# Patient Record
Sex: Male | Born: 1955 | Race: White | Hispanic: No | Marital: Single | Smoking: Current every day smoker
Health system: Southern US, Community
[De-identification: ages and names within clinical notes are randomized; demographics above are authoritative.]

## PROBLEM LIST (undated history)

## (undated) DIAGNOSIS — M109 Gout, unspecified: Secondary | ICD-10-CM

## (undated) DIAGNOSIS — C801 Malignant (primary) neoplasm, unspecified: Secondary | ICD-10-CM

## (undated) DIAGNOSIS — N200 Calculus of kidney: Secondary | ICD-10-CM

## (undated) DIAGNOSIS — H269 Unspecified cataract: Secondary | ICD-10-CM

## (undated) DIAGNOSIS — B009 Herpesviral infection, unspecified: Secondary | ICD-10-CM

## (undated) DIAGNOSIS — M1990 Unspecified osteoarthritis, unspecified site: Secondary | ICD-10-CM

## (undated) DIAGNOSIS — G4730 Sleep apnea, unspecified: Secondary | ICD-10-CM

## (undated) DIAGNOSIS — I10 Essential (primary) hypertension: Secondary | ICD-10-CM

## (undated) DIAGNOSIS — M810 Age-related osteoporosis without current pathological fracture: Secondary | ICD-10-CM

## (undated) DIAGNOSIS — Z820 Family history of epilepsy and other diseases of the nervous system: Secondary | ICD-10-CM

## (undated) DIAGNOSIS — J439 Emphysema, unspecified: Secondary | ICD-10-CM

## (undated) DIAGNOSIS — E785 Hyperlipidemia, unspecified: Secondary | ICD-10-CM

## (undated) DIAGNOSIS — J449 Chronic obstructive pulmonary disease, unspecified: Secondary | ICD-10-CM

## (undated) HISTORY — PX: BLADDER SURGERY: SHX569

## (undated) HISTORY — DX: Unspecified cataract: H26.9

## (undated) HISTORY — DX: Age-related osteoporosis without current pathological fracture: M81.0

## (undated) HISTORY — DX: Essential (primary) hypertension: I10

## (undated) HISTORY — DX: Malignant (primary) neoplasm, unspecified: C80.1

## (undated) HISTORY — DX: Sleep apnea, unspecified: G47.30

## (undated) HISTORY — DX: Hyperlipidemia, unspecified: E78.5

## (undated) HISTORY — DX: Herpesviral infection, unspecified: B00.9

## (undated) HISTORY — PX: SURGERY SCROTAL / TESTICULAR: SUR1316

## (undated) HISTORY — DX: Calculus of kidney: N20.0

## (undated) HISTORY — PX: OTHER SURGICAL HISTORY: SHX169

## (undated) HISTORY — DX: Family history of epilepsy and other diseases of the nervous system: Z82.0

## (undated) HISTORY — DX: Unspecified osteoarthritis, unspecified site: M19.90

## (undated) HISTORY — PX: FOOT SURGERY: SHX648

## (undated) HISTORY — DX: Emphysema, unspecified: J43.9

---

## 2012-09-23 ENCOUNTER — Emergency Department (HOSPITAL_COMMUNITY)
Admission: EM | Admit: 2012-09-23 | Discharge: 2012-09-24 | Disposition: A | Discharge status: Home / Self Care | Payer: Self-pay | Attending: Emergency Medicine | Admitting: Emergency Medicine

## 2012-09-23 ENCOUNTER — Encounter (HOSPITAL_COMMUNITY): Payer: Self-pay | Admitting: *Deleted

## 2012-09-23 DIAGNOSIS — F17200 Nicotine dependence, unspecified, uncomplicated: Secondary | ICD-10-CM | POA: Insufficient documentation

## 2012-09-23 DIAGNOSIS — J449 Chronic obstructive pulmonary disease, unspecified: Secondary | ICD-10-CM | POA: Insufficient documentation

## 2012-09-23 DIAGNOSIS — Z79899 Other long term (current) drug therapy: Secondary | ICD-10-CM | POA: Insufficient documentation

## 2012-09-23 DIAGNOSIS — J4489 Other specified chronic obstructive pulmonary disease: Secondary | ICD-10-CM | POA: Insufficient documentation

## 2012-09-23 DIAGNOSIS — M109 Gout, unspecified: Secondary | ICD-10-CM | POA: Insufficient documentation

## 2012-09-23 HISTORY — DX: Gout, unspecified: M10.9

## 2012-09-23 HISTORY — DX: Chronic obstructive pulmonary disease, unspecified: J44.9

## 2012-09-23 NOTE — ED Notes (Signed)
Pt reporting flare up of gout.  Reports he has taken medications prescribed from [place], but it didn't prevent flare.  Reporting pain began 2 days ago.

## 2012-09-23 NOTE — ED Provider Notes (Signed)
History     CSN: [id number]  Arrival date & time 09/23/12  2325   First MD Initiated Contact with Patient 09/23/12 2337      Chief Complaint  Patient presents with  . Gout    (Consider location/radiation/quality/duration/timing/severity/associated sxs/prior treatment) Patient is a 56 y.o. male presenting with lower extremity pain. The history is provided by the patient.  Foot Pain This is a new problem. The current episode started in the past 7 days. The problem occurs constantly. The problem has been gradually worsening. Associated symptoms include arthralgias. Pertinent negatives include no abdominal pain, chest pain, coughing or neck pain. The symptoms are aggravated by standing and walking. He has tried nothing for the symptoms. The treatment provided no relief.    No past medical history on file.  No past surgical history on file.  No family history on file.  History  Substance Use Topics  . Smoking status: Not on file  . Smokeless tobacco: Not on file  . Alcohol Use: Not on file      Review of Systems  Constitutional: Negative for activity change.       All ROS Neg except as noted in HPI  HENT: Negative for nosebleeds and neck pain.   Eyes: Negative for photophobia and discharge.  Respiratory: Negative for cough, shortness of breath and wheezing.   Cardiovascular: Negative for chest pain and palpitations.  Gastrointestinal: Negative for abdominal pain and blood in stool.  Genitourinary: Negative for dysuria, frequency and hematuria.  Musculoskeletal: Positive for arthralgias. Negative for back pain.  Skin: Negative.   Neurological: Negative for dizziness, seizures and speech difficulty.  Psychiatric/Behavioral: Negative for hallucinations and confusion.    Allergies  Review of patient's allergies indicates not on file.  Home Medications  No current outpatient prescriptions on file.  There were no vitals taken for this visit.  Physical Exam  Nursing  note and vitals reviewed. Constitutional: He is oriented to person, place, and time. He appears well-developed and well-nourished.  Non-toxic appearance.  HENT:  Head: Normocephalic.  Right Ear: Tympanic membrane and external ear normal.  Left Ear: Tympanic membrane and external ear normal.  Eyes: EOM and lids are normal. Pupils are equal, round, and reactive to light.  Neck: Normal range of motion. Neck supple. Carotid bruit is not present.  Cardiovascular: Normal rate, regular rhythm, normal heart sounds, intact distal pulses and normal pulses.   Pulmonary/Chest: Breath sounds normal. No respiratory distress.  Abdominal: Soft. Bowel sounds are normal. There is no tenderness. There is no guarding.  Musculoskeletal: Normal range of motion.       There is pain to palpation of the toes and the dorsum of the right foot. No lesions between the toes. No punctures to the plantar surface. Dorsalis pedis pulse and posterior tibial pulses are 2+ symmetrical. There is increased redness of the right first toe with some increased warmth present.  Lymphadenopathy:       Head (right side): No submandibular adenopathy present.       Head (left side): No submandibular adenopathy present.    He has no cervical adenopathy.  Neurological: He is alert and oriented to person, place, and time. He has normal strength. No cranial nerve deficit or sensory deficit.  Skin: Skin is warm and dry.  Psychiatric: He has a normal mood and affect. His speech is normal.    ED Course  Procedures (including critical care time)  Labs Reviewed - No data to display No results found.  No diagnosis found.    MDM  I have reviewed nursing notes, vital signs, and all appropriate lab and imaging results for this patient. Patient has history of gout related arthritis he has been treated by the [facility] with allopurinol.  the patient statedthat he began to notice the pain increasing approximately 3-4  days ago. He stopped the allopurinol because the pains was getting worse, and after being unable to stand for any length of time without severe pain today while at work he now presents to the emergency department. Vital signs are within normal limits with exception of the blood pressure being elevated at 164/98. No neurovascular deficits appreciated. Prescription for Mobic, Decadron, and Norco given to the patient. Patient is to follow up with the physicians at the [facility] as sone as possible.       Kathie Dike, [place] 09/24/12 [phone]

## 2012-09-24 MED ORDER — ONDANSETRON HCL 4 MG PO TABS
4.0000 mg | ORAL_TABLET | Freq: Once | ORAL | Status: AC
  Administered 2012-09-24: 4 mg via ORAL
  Filled 2012-09-24: qty 1

## 2012-09-24 MED ORDER — DEXAMETHASONE 6 MG PO TABS: ORAL_TABLET | ORAL | Status: DC

## 2012-09-24 MED ORDER — MORPHINE SULFATE 4 MG/ML IJ SOLN
8.0000 mg | Freq: Once | INTRAMUSCULAR | Status: AC
  Administered 2012-09-24: 8 mg via INTRAMUSCULAR
  Filled 2012-09-24: qty 2

## 2012-09-24 MED ORDER — MELOXICAM 7.5 MG PO TABS: 7.5000 mg | ORAL_TABLET | Freq: Every day | ORAL | Status: DC

## 2012-09-24 MED ORDER — DEXAMETHASONE SODIUM PHOSPHATE 4 MG/ML IJ SOLN
8.0000 mg | Freq: Once | INTRAMUSCULAR | Status: AC
  Administered 2012-09-24: 8 mg via INTRAMUSCULAR
  Filled 2012-09-24: qty 2

## 2012-09-24 MED ORDER — HYDROCODONE-ACETAMINOPHEN 5-325 MG PO TABS: ORAL_TABLET | ORAL | Status: DC

## 2012-09-24 MED ORDER — KETOROLAC TROMETHAMINE 10 MG PO TABS
10.0000 mg | ORAL_TABLET | Freq: Once | ORAL | Status: AC
  Administered 2012-09-24: 10 mg via ORAL
  Filled 2012-09-24: qty 1

## 2012-09-24 NOTE — ED Provider Notes (Signed)
Medical screening examination/treatment/procedure(s) were performed by non-physician practitioner and as supervising physician I was immediately available for consultation/collaboration.  Nicoletta Dress. Colon Branch, MD 09/24/12 1610

## 2018-10-09 ENCOUNTER — Encounter: Payer: Self-pay | Admitting: Gastroenterology

## 2018-10-28 ENCOUNTER — Other Ambulatory Visit: Payer: Self-pay

## 2018-10-28 ENCOUNTER — Ambulatory Visit (AMBULATORY_SURGERY_CENTER): Payer: Self-pay | Admitting: *Deleted

## 2018-10-28 VITALS — Ht 67.5 in | Wt 158.2 lb

## 2018-10-28 DIAGNOSIS — Z1211 Encounter for screening for malignant neoplasm of colon: Secondary | ICD-10-CM

## 2018-10-28 MED ORDER — PEG-KCL-NACL-NASULF-NA ASC-C 100 G PO SOLR: 1.0000 | Freq: Once | ORAL | 0 refills | Status: AC

## 2018-10-28 NOTE — Progress Notes (Signed)
Patient denies any allergies to egg or soy products. Patient denies complications with anesthesia/sedation.  Patient denies oxygen use at home and denies diet medications. Patient denies information on colonoscopy.  Patient does not use CPAP machine nightly.

## 2018-11-11 ENCOUNTER — Ambulatory Visit (AMBULATORY_SURGERY_CENTER): Payer: No Typology Code available for payment source | Admitting: Gastroenterology

## 2018-11-11 ENCOUNTER — Encounter: Payer: Self-pay | Admitting: Gastroenterology

## 2018-11-11 VITALS — BP 143/88 | HR 52 | Temp 97.8°F | Resp 15 | Ht 67.5 in | Wt 158.0 lb

## 2018-11-11 DIAGNOSIS — D128 Benign neoplasm of rectum: Secondary | ICD-10-CM | POA: Diagnosis not present

## 2018-11-11 DIAGNOSIS — D124 Benign neoplasm of descending colon: Secondary | ICD-10-CM | POA: Diagnosis not present

## 2018-11-11 DIAGNOSIS — D122 Benign neoplasm of ascending colon: Secondary | ICD-10-CM

## 2018-11-11 DIAGNOSIS — Z1211 Encounter for screening for malignant neoplasm of colon: Secondary | ICD-10-CM | POA: Diagnosis not present

## 2018-11-11 MED ORDER — SODIUM CHLORIDE 0.9 % IV SOLN: 500.0000 mL | Freq: Once | INTRAVENOUS | Status: DC

## 2018-11-11 NOTE — Op Note (Signed)
[place] Patient Name: Glen Cobb Procedure Date: 11/11/2018 10:51 AM MRN: [id number] Endoscopist: Thornton Park MD, MD Age: 63 Referring MD:  Date of Birth: April 15, 1956 Gender: Male Account #: [id number] Procedure:                Colonoscopy Indications:              Screening for colorectal malignant neoplasm, This                            is the patient's first colonoscopy. No known family                            history of colon cancer or polyps. Chronic lower                            abdominal/groin pain following treatment for                            bladder cancer. Medicines:                See the Anesthesia note for documentation of the                            administered medications Procedure:                Pre-Anesthesia Assessment:                           - Prior to the procedure, a History and Physical                            was performed, and patient medications and                            allergies were reviewed. The patient's tolerance of                            previous anesthesia was also reviewed. The risks                            and benefits of the procedure and the sedation                            options and risks were discussed with the patient.                            All questions were answered, and informed consent                            was obtained. Prior Anticoagulants: The patient has                            taken no previous anticoagulant or antiplatelet  agents. ASA Grade Assessment: II - A patient with                            mild systemic disease. After reviewing the risks                            and benefits, the patient was deemed in                            satisfactory condition to undergo the procedure.                           After obtaining informed consent, the colonoscope                            was passed under direct vision. Throughout the                             procedure, the patient's blood pressure, pulse, and                            oxygen saturations were monitored continuously. The                            Colonoscope was introduced through the anus and                            advanced to the the terminal ileum, with                            identification of the appendiceal orifice and IC                            valve. The colonoscopy was performed without                            difficulty. The patient tolerated the procedure                            well. The quality of the bowel preparation was                            good. The terminal ileum, ileocecal valve,                            appendiceal orifice, and rectum were photographed. Scope In: 11:00:40 AM Scope Out: 11:15:53 AM Scope Withdrawal Time: 0 hours 12 minutes 45 seconds  Total Procedure Duration: 0 hours 15 minutes 13 seconds  Findings:                 The perianal and digital rectal examinations were                            normal.  Two sessile polyps were found in the ascending                            colon. The polyps were 3 to 8 mm in size. These                            polyps were removed with a cold snare. Resection                            and retrieval were complete. Estimated blood loss                            was minimal.                           Two sessile polyps were found in the descending                            colon. The polyps were 4 to 6 mm in size. These                            polyps were removed with a cold snare. Resection                            and retrieval were complete. Estimated blood loss                            was minimal.                           Two sessile polyps were found in the rectum. The                            polyps were 2 to 3 mm in size. These polyps were                            removed with a cold snare. Resection and  retrieval                            were complete.                           The exam was otherwise without abnormality on                            direct and retroflexion views. Complications:            No immediate complications. Estimated blood loss:                            Minimal. Estimated Blood Loss:     Estimated blood loss was minimal. Impression:               - Two 3 to 8 mm polyps in the ascending colon,  removed with a cold snare. Resected and retrieved.                           - Two 4 to 6 mm polyps in the descending colon,                            removed with a cold snare. Resected and retrieved.                           - Two 2 to 3 mm polyps in the rectum, removed with                            a cold snare. Resected and retrieved.                           - The examination was otherwise normal on direct                            and retroflexion views. Recommendation:           - Patient has a contact number available for                            emergencies. The signs and symptoms of potential                            delayed complications were discussed with the                            patient. Return to normal activities tomorrow.                            Written discharge instructions were provided to the                            patient.                           - Resume regular diet.                           - Continue present medications.                           - Await pathology results.                           - Repeat colonoscopy in 3 years for surveillance if                            at least 5 polyps are adenomatous. Repeat                            colonoscopy in 5 years if at least 3 polyps are  adenomatous. Thornton Park MD, MD 11/11/2018 11:35:08 AM This report has been signed electronically.

## 2018-11-11 NOTE — Progress Notes (Signed)
Patient had a sleep study last week but doesn't know the results yet. Does have hx sleep apnea.

## 2018-11-11 NOTE — Progress Notes (Signed)
PT taken to PACU. Monitors in place. VSS. Report given to RN. 

## 2018-11-11 NOTE — Patient Instructions (Signed)
     Thank you for allowing [place] to care for you today!  Await pathology results by mail, approximately 2 weeks.  Will make recommendation at that time for date of next colonoscopy.  Resume previous diet and medications today.  Return to normal activities tomorrow.     YOU HAD AN ENDOSCOPIC PROCEDURE TODAY AT [facility]:   Refer to the procedure report that was given to you for any specific questions about what was found during the examination.  If the procedure report does not answer your questions, please call your gastroenterologist to clarify.  If you requested that your care partner not be given the details of your procedure findings, then the procedure report has been included in a sealed envelope for you to review at your convenience later.  YOU SHOULD EXPECT: Some feelings of bloating in the abdomen. Passage of more gas than usual.  Walking can help get rid of the air that was put into your GI tract during the procedure and reduce the bloating. If you had a lower endoscopy (such as a colonoscopy or flexible sigmoidoscopy) you may notice spotting of blood in your stool or on the toilet paper. If you underwent a bowel prep for your procedure, you may not have a normal bowel movement for a few days.  Please Note:  You might notice some irritation and congestion in your nose or some drainage.  This is from the oxygen used during your procedure.  There is no need for concern and it should clear up in a day or so.  SYMPTOMS TO REPORT IMMEDIATELY:   Following lower endoscopy (colonoscopy or flexible sigmoidoscopy):  Excessive amounts of blood in the stool  Significant tenderness or worsening of abdominal pains  Swelling of the abdomen that is new, acute  Fever of 100F or higher   For urgent or emergent issues, a gastroenterologist can be reached at any hour by calling [phone].   DIET:  We do recommend a small meal at first, but then you may proceed to your  regular diet.  Drink plenty of fluids but you should avoid alcoholic beverages for 24 hours.  ACTIVITY:  You should plan to take it easy for the rest of today and you should NOT DRIVE or use heavy machinery until tomorrow (because of the sedation medicines used during the test).    FOLLOW UP: Our staff will call the number listed on your records the next business day following your procedure to check on you and address any questions or concerns that you may have regarding the information given to you following your procedure. If we do not reach you, we will leave a message.  However, if you are feeling well and you are not experiencing any problems, there is no need to return our call.  We will assume that you have returned to your regular daily activities without incident.  If any biopsies were taken you will be contacted by phone or by letter within the next 1-3 weeks.  Please call us at [phone] if you have not heard about the biopsies in 3 weeks.    SIGNATURES/CONFIDENTIALITY: You and/or your care partner have signed paperwork which will be entered into your electronic medical record.  These signatures attest to the fact that that the information above on your After Visit Summary has been reviewed and is understood.  Full responsibility of the confidentiality of this discharge information lies with you and/or your care-partner.

## 2018-11-11 NOTE — Progress Notes (Signed)
Called to room to assist during endoscopic procedure.  Patient ID and intended procedure confirmed with present staff. Received instructions for my participation in the procedure from the performing physician.  

## 2018-11-12 ENCOUNTER — Telehealth: Payer: Self-pay | Admitting: *Deleted

## 2018-11-12 NOTE — Telephone Encounter (Signed)
  Follow up Call-  Call back number 11/11/2018  Post procedure Call Back phone  # [phone]  Permission to leave phone message Yes  Some recent data might be hidden     Patient questions:  Do you have a fever, pain , or abdominal swelling? No. Pain Score  0 *  Have you tolerated food without any problems? Yes.    Have you been able to return to your normal activities? Yes.    Do you have any questions about your discharge instructions: Diet   No. Medications  No. Follow up visit  No.  Do you have questions or concerns about your Care? Yes.    Actions: * If pain score is 4 or above: No action needed, pain <4.

## 2018-11-13 ENCOUNTER — Encounter: Payer: Self-pay | Admitting: Gastroenterology

## 2019-08-18 ENCOUNTER — Encounter (HOSPITAL_COMMUNITY): Payer: Self-pay | Admitting: *Deleted

## 2019-08-18 ENCOUNTER — Inpatient Hospital Stay (HOSPITAL_COMMUNITY)
Admission: EM | Admit: 2019-08-18 | Discharge: 2019-08-22 | DRG: 201 | Disposition: A | Discharge status: Home / Self Care | Payer: No Typology Code available for payment source | Attending: Internal Medicine | Admitting: Internal Medicine

## 2019-08-18 ENCOUNTER — Other Ambulatory Visit: Payer: Self-pay

## 2019-08-18 ENCOUNTER — Emergency Department (HOSPITAL_COMMUNITY): Payer: No Typology Code available for payment source

## 2019-08-18 DIAGNOSIS — Z885 Allergy status to narcotic agent status: Secondary | ICD-10-CM

## 2019-08-18 DIAGNOSIS — I10 Essential (primary) hypertension: Secondary | ICD-10-CM | POA: Diagnosis present

## 2019-08-18 DIAGNOSIS — F17210 Nicotine dependence, cigarettes, uncomplicated: Secondary | ICD-10-CM | POA: Diagnosis present

## 2019-08-18 DIAGNOSIS — Z7951 Long term (current) use of inhaled steroids: Secondary | ICD-10-CM | POA: Diagnosis not present

## 2019-08-18 DIAGNOSIS — M810 Age-related osteoporosis without current pathological fracture: Secondary | ICD-10-CM | POA: Diagnosis present

## 2019-08-18 DIAGNOSIS — J441 Chronic obstructive pulmonary disease with (acute) exacerbation: Secondary | ICD-10-CM | POA: Diagnosis not present

## 2019-08-18 DIAGNOSIS — Z8551 Personal history of malignant neoplasm of bladder: Secondary | ICD-10-CM | POA: Diagnosis not present

## 2019-08-18 DIAGNOSIS — R0602 Shortness of breath: Secondary | ICD-10-CM

## 2019-08-18 DIAGNOSIS — J439 Emphysema, unspecified: Secondary | ICD-10-CM | POA: Diagnosis present

## 2019-08-18 DIAGNOSIS — E785 Hyperlipidemia, unspecified: Secondary | ICD-10-CM | POA: Diagnosis present

## 2019-08-18 DIAGNOSIS — M109 Gout, unspecified: Secondary | ICD-10-CM | POA: Diagnosis present

## 2019-08-18 DIAGNOSIS — Z9689 Presence of other specified functional implants: Secondary | ICD-10-CM

## 2019-08-18 DIAGNOSIS — Z20828 Contact with and (suspected) exposure to other viral communicable diseases: Secondary | ICD-10-CM | POA: Diagnosis present

## 2019-08-18 DIAGNOSIS — Z720 Tobacco use: Secondary | ICD-10-CM | POA: Diagnosis not present

## 2019-08-18 DIAGNOSIS — G609 Hereditary and idiopathic neuropathy, unspecified: Secondary | ICD-10-CM | POA: Diagnosis present

## 2019-08-18 DIAGNOSIS — J930 Spontaneous tension pneumothorax: Secondary | ICD-10-CM | POA: Diagnosis present

## 2019-08-18 DIAGNOSIS — J939 Pneumothorax, unspecified: Secondary | ICD-10-CM

## 2019-08-18 DIAGNOSIS — D751 Secondary polycythemia: Secondary | ICD-10-CM | POA: Diagnosis present

## 2019-08-18 DIAGNOSIS — G8929 Other chronic pain: Secondary | ICD-10-CM | POA: Diagnosis present

## 2019-08-18 DIAGNOSIS — E876 Hypokalemia: Secondary | ICD-10-CM | POA: Diagnosis present

## 2019-08-18 DIAGNOSIS — Z91018 Allergy to other foods: Secondary | ICD-10-CM | POA: Diagnosis not present

## 2019-08-18 DIAGNOSIS — M1990 Unspecified osteoarthritis, unspecified site: Secondary | ICD-10-CM | POA: Diagnosis present

## 2019-08-18 DIAGNOSIS — K219 Gastro-esophageal reflux disease without esophagitis: Secondary | ICD-10-CM | POA: Diagnosis present

## 2019-08-18 LAB — COMPREHENSIVE METABOLIC PANEL
ALT: 34 U/L (ref 0–44)
AST: 38 U/L (ref 15–41)
Albumin: 4.5 g/dL (ref 3.5–5.0)
Alkaline Phosphatase: 96 U/L (ref 38–126)
Anion gap: 18 — ABNORMAL HIGH (ref 5–15)
BUN: 7 mg/dL — ABNORMAL LOW (ref 8–23)
CO2: 22 mmol/L (ref 22–32)
Calcium: 10.2 mg/dL (ref 8.9–10.3)
Chloride: 99 mmol/L (ref 98–111)
Creatinine, Ser: 0.71 mg/dL (ref 0.61–1.24)
GFR calc Af Amer: >60 mL/min (ref >60)
GFR calc non Af Amer: >60 mL/min (ref >60)
Glucose, Bld: 124 mg/dL — ABNORMAL HIGH (ref 70–99)
Potassium: 3.4 mmol/L — ABNORMAL LOW (ref 3.5–5.1)
Sodium: 139 mmol/L (ref 135–145)
Total Bilirubin: 0.9 mg/dL (ref 0.3–1.2)
Total Protein: 8.8 g/dL — ABNORMAL HIGH (ref 6.5–8.1)

## 2019-08-18 LAB — CBC WITH DIFFERENTIAL/PLATELET
Abs Immature Granulocytes: 0.02 10*3/uL (ref 0.00–0.07)
Basophils Absolute: 0.1 10*3/uL (ref 0.0–0.1)
Basophils Relative: 1 %
Eosinophils Absolute: 0.2 10*3/uL (ref 0.0–0.5)
Eosinophils Relative: 2 %
HCT: 60.9 % — ABNORMAL HIGH (ref 39.0–52.0)
Hemoglobin: 20.9 g/dL — ABNORMAL HIGH (ref 13.0–17.0)
Immature Granulocytes: 0 %
Lymphocytes Relative: 47 %
Lymphs Abs: 4.2 10*3/uL — ABNORMAL HIGH (ref 0.7–4.0)
MCH: 33.4 pg (ref 26.0–34.0)
MCHC: 34.3 g/dL (ref 30.0–36.0)
MCV: 97.3 fL (ref 80.0–100.0)
Monocytes Absolute: 0.8 10*3/uL (ref 0.1–1.0)
Monocytes Relative: 9 %
Neutro Abs: 3.8 10*3/uL (ref 1.7–7.7)
Neutrophils Relative %: 41 %
Platelets: 279 10*3/uL (ref 150–400)
RBC: 6.26 MIL/uL — ABNORMAL HIGH (ref 4.22–5.81)
RDW: 13.2 % (ref 11.5–15.5)
WBC: 9.1 10*3/uL (ref 4.0–10.5)
nRBC: 0 % (ref 0.0–0.2)

## 2019-08-18 LAB — BRAIN NATRIURETIC PEPTIDE: B Natriuretic Peptide: 32 pg/mL (ref 0.0–100.0)

## 2019-08-18 LAB — D-DIMER, QUANTITATIVE: D-Dimer, Quant: 0.36 ug/mL-FEU (ref 0.00–0.50)

## 2019-08-18 LAB — TROPONIN I (HIGH SENSITIVITY): Troponin I (High Sensitivity): 3 ng/L (ref <18)

## 2019-08-18 MED ORDER — ALBUTEROL SULFATE HFA 108 (90 BASE) MCG/ACT IN AERS
4.0000 | INHALATION_SPRAY | Freq: Once | RESPIRATORY_TRACT | Status: AC
  Administered 2019-08-18: 22:00:00 4 via RESPIRATORY_TRACT
  Filled 2019-08-18: qty 6.7

## 2019-08-18 MED ORDER — LIDOCAINE-EPINEPHRINE (PF) 2 %-1:200000 IJ SOLN
20.0000 mL | Freq: Once | INTRAMUSCULAR | Status: AC
  Administered 2019-08-18: 20 mL
  Filled 2019-08-18: qty 20

## 2019-08-18 MED ORDER — HYDROMORPHONE HCL 1 MG/ML IJ SOLN
1.0000 mg | Freq: Once | INTRAMUSCULAR | Status: AC
  Administered 2019-08-18: 23:00:00 1 mg via INTRAVENOUS

## 2019-08-18 MED ORDER — MIDAZOLAM HCL 2 MG/2ML IJ SOLN
1.0000 mg | Freq: Once | INTRAMUSCULAR | Status: AC
  Administered 2019-08-18: 1 mg via INTRAVENOUS
  Filled 2019-08-18: qty 2

## 2019-08-18 MED ORDER — HYDROMORPHONE HCL 1 MG/ML IJ SOLN
1.0000 mg | Freq: Once | INTRAMUSCULAR | Status: AC
  Administered 2019-08-18: 1 mg via INTRAVENOUS

## 2019-08-18 MED ORDER — HYDROMORPHONE HCL 1 MG/ML IJ SOLN
INTRAMUSCULAR | Status: AC
  Filled 2019-08-18: qty 1

## 2019-08-18 MED ORDER — MIDAZOLAM HCL 2 MG/2ML IJ SOLN
INTRAMUSCULAR | Status: AC
  Filled 2019-08-18: qty 2

## 2019-08-18 MED ORDER — MAGNESIUM SULFATE 2 GM/50ML IV SOLN
2.0000 g | Freq: Once | INTRAVENOUS | Status: AC
  Administered 2019-08-18: 22:00:00 2 g via INTRAVENOUS
  Filled 2019-08-18: qty 50

## 2019-08-18 MED ORDER — METHYLPREDNISOLONE SODIUM SUCC 125 MG IJ SOLR
125.0000 mg | Freq: Once | INTRAMUSCULAR | Status: AC
  Administered 2019-08-18: 22:00:00 125 mg via INTRAVENOUS
  Filled 2019-08-18: qty 2

## 2019-08-18 MED ORDER — MIDAZOLAM HCL 2 MG/2ML IJ SOLN
1.0000 mg | Freq: Once | INTRAMUSCULAR | Status: AC
  Administered 2019-08-18: 23:00:00 1 mg via INTRAVENOUS

## 2019-08-18 MED ORDER — MORPHINE SULFATE (PF) 4 MG/ML IV SOLN
4.0000 mg | Freq: Once | INTRAVENOUS | Status: AC
  Administered 2019-08-18: 4 mg via INTRAVENOUS
  Filled 2019-08-18: qty 1

## 2019-08-18 NOTE — ED Triage Notes (Signed)
Pt c/o severe sob and left side chest pain that started x one hour ago

## 2019-08-18 NOTE — ED Provider Notes (Signed)
[facility] EMERGENCY DEPARTMENT Provider Note   CSN: [id number] Arrival date & time: 08/18/19  2201     History   Chief Complaint Chief Complaint  Patient presents with   Shortness of Breath    HPI Glen Cobb is a 63 y.o. male.     Level 5 caveat for acuity of condition.  Patient presents with severe shortness of breath and chest pain that onset while he was resting about 1 hour ago.  Was well before this.  He comes in tachycardic and hypoxic complaining of left-sided chest pain.  Clinical concern for pneumothorax.  Patient states he had a pneumothorax before on the right side many years ago.  He denies any recent cough, fever, abdominal pain, nausea, vomiting.  No leg pain or leg swelling.  No recent cough, chills, runny nose, sore throat.  No abdominal pain, nausea or vomiting.  No blood thinner use.  The history is provided by the patient. The history is limited by the condition of the patient.  Shortness of Breath Associated symptoms: chest pain and cough   Associated symptoms: no abdominal pain, no fever, no headaches, no rash and no vomiting     Past Medical History:  Diagnosis Date   Arthritis    Cancer ([place])    bladder cancer x 2   Cataract    MD just watching   COPD (chronic obstructive pulmonary disease) (HCC)    Emphysema of lung (HCC)    FH: migraines    last one 1 yr ago - OTC med prn   Gout    HSV infection    Hyperlipidemia    no med. diet   Hypertension    Kidney stone    passed stone - no surgery   Osteoporosis    Sleep apnea    Does not use CPAP nightly.    There are no active problems to display for this patient.   Past Surgical History:  Procedure Laterality Date   arm surgery Right    with metal plate   BLADDER SURGERY     x 2 for bladder cancer    FOOT SURGERY Right    SURGERY SCROTAL / TESTICULAR     cyst removed from testicular        Home Medications    Prior to Admission medications   Medication  Sig Start Date End Date Taking? Authorizing Provider  ACYCLOVIR PO Take by mouth daily as needed.    [provider]  albuterol-ipratropium (COMBIVENT) 18-103 MCG/ACT inhaler Inhale 2 puffs into the lungs every 6 (six) hours as needed.    [provider]  allopurinol (ZYLOPRIM) 100 MG tablet Take 100 mg by mouth daily.    [provider]  amLODipine (NORVASC) 5 MG tablet Take 5 mg by mouth daily.    [provider]  budesonide-formoterol (SYMBICORT) 160-4.5 MCG/ACT inhaler Inhale 2 puffs into the lungs 2 (two) times daily.    [provider]  Cholecalciferol (VITAMIN D3) 50 MCG (2000 UT) capsule Take by mouth.    [provider]  gabapentin (NEURONTIN) 600 MG tablet Take 600 mg by mouth 3 (three) times daily.    [provider]  HYDROcodone-acetaminophen (NORCO/VICODIN) 5-325 MG per tablet 1 or 2 po q4h prn pain. Please take with food Patient not taking: Reported on 10/28/2018 09/24/12   Lily Kocher, PA-C  ibuprofen (ADVIL,MOTRIN) 200 MG tablet Take by mouth.    [provider]    Family History Family History  Adopted: Yes    Social History Social History   Tobacco Use   Smoking status: Current Every Day Smoker    Packs/day: 0.25    Years: 46.00    Pack years: 11.50    Types: Cigarettes   Smokeless tobacco: Current User    Types: Snuff  Substance Use Topics   Alcohol use: Yes    Alcohol/week: 4.0 - 5.0 standard drinks    Types: 4 - 5 Standard drinks or equivalent per week    Comment: Rum    Drug use: Yes    Types: Marijuana    Comment: Last use 10/23/2018     Allergies   Codeine, Hydrocodone-acetaminophen, and Other   Review of Systems Review of Systems  Constitutional: Negative for activity change, appetite change and fever.  Respiratory: Positive for cough, chest tightness and shortness of breath.   Cardiovascular: Positive for chest pain.  Gastrointestinal: Negative for abdominal pain and  vomiting.  Musculoskeletal: Negative for arthralgias and myalgias.  Skin: Negative for rash.  Neurological: Negative for dizziness, weakness and headaches.    all other systems are negative except as noted in the HPI and PMH.    Physical Exam Updated Vital Signs BP 120/77    Pulse (!) 118    Resp (!) 23    Ht 5\' 9"  (1.753 m)    Wt 75.8 kg    SpO2 (!) 89%    BMI 24.66 kg/m   Physical Exam Vitals signs and nursing note reviewed.  Constitutional:      General: He is in acute distress.     Appearance: He is well-developed.     Comments: Distress from pain  HENT:     Head: Normocephalic and atraumatic.     Mouth/Throat:     Pharynx: No oropharyngeal exudate.  Eyes:     Conjunctiva/sclera: Conjunctivae normal.     Pupils: Pupils are equal, round, and reactive to light.  Neck:     Musculoskeletal: Normal range of motion and neck supple.     Comments: No meningismus. Cardiovascular:     Rate and Rhythm: Normal rate and regular rhythm.     Heart sounds: Normal heart sounds. No murmur.  Pulmonary:     Effort: Pulmonary effort is normal. No respiratory distress.     Comments: Tachypneic with splinting respirations, no breath sounds on the left Abdominal:     Palpations: Abdomen is soft.     Tenderness: There is no abdominal tenderness. There is no guarding or rebound.  Musculoskeletal: Normal range of motion.        General: No tenderness.  Skin:    General: Skin is warm.     Capillary Refill: Capillary refill takes less than 2 seconds.  Neurological:     General: No focal deficit present.     Mental Status: He is alert and oriented to person, place, and time. Mental status is at baseline.     Cranial Nerves: No cranial nerve deficit.     Motor: No abnormal muscle tone.     Coordination: Coordination normal.     Comments: No ataxia on finger to nose bilaterally. No pronator drift. 5/5 strength throughout. CN 2-12 intact.Equal grip strength. Sensation intact.   Psychiatric:         Behavior: Behavior normal.      ED Treatments / Results  Labs (all labs ordered are listed, but only abnormal results are displayed) Labs Reviewed  CBC WITH DIFFERENTIAL/PLATELET - Abnormal; Notable for the following components:  Result Value   RBC 6.26 (*)    Hemoglobin 20.9 (*)    HCT 60.9 (*)    Lymphs Abs 4.2 (*)    All other components within normal limits  COMPREHENSIVE METABOLIC PANEL - Abnormal; Notable for the following components:   Potassium 3.4 (*)    Glucose, Bld 124 (*)    BUN 7 (*)    Total Protein 8.8 (*)    Anion gap 18 (*)    All other components within normal limits  SARS CORONAVIRUS 2 (TAT 6-24 HRS)  BRAIN NATRIURETIC PEPTIDE  D-DIMER, QUANTITATIVE (NOT AT [facility])  TROPONIN I (HIGH SENSITIVITY)  TROPONIN I (HIGH SENSITIVITY)    EKG EKG Interpretation  Date/Time:  Tuesday August 18 2019 22:08:52 EST Ventricular Rate:  122 PR Interval:    QRS Duration: 144 QT Interval:  344 QTC Calculation: 491 R Axis:   97 Text Interpretation: Artifact Sinus tachycardia Nonspecific intraventricular conduction delay Probable anteroseptal infarct, old Baseline wander in lead(s) II aVF Confirmed by Ezequiel Essex [phone]) on 08/18/2019 10:17:10 PM   Radiology Dg Chest 1v Repeat Same Day  Result Date: 08/18/2019 CLINICAL DATA:  Chest tube placement EXAM: CHEST - 1 VIEW SAME DAY COMPARISON:  Radiograph 08/18/2019 FINDINGS: Interval placement of a left-sided pigtail chest tube with re-expansion of the left lung. Small residual apical pneumothorax is present. Some residual hazy opacity in the bases may reflect atelectatic lung or re-expansion edema in the left lung. There are biapical areas of scarring and basilar airways thickening. IMPRESSION: Re-expansion of the left lung post placement of a pigtail pleural drain. Small residual left apical pneumothorax. Improved mediastinal shift. Extensive left chest wall subcutaneous emphysema. Electronically Signed   By:  Lovena Le M.D.   On: 08/18/2019 22:53   Dg Chest Portable 1 View  Result Date: 08/18/2019 CLINICAL DATA:  Shortness of breath, decreased breath sounds on the left. EXAM: PORTABLE CHEST 1 VIEW COMPARISON:  None. FINDINGS: Cardiac shadow is within normal limits. Aortic calcifications are seen. Large left-sided pneumothorax is noted of at least 60% with mild mediastinal shift to the right. No focal infiltrate is seen. Some scarring is noted in the apices bilateral likely related to emphysematous changes and bullous disease. IMPRESSION: Large left-sided pneumothorax with tension component and shift to the right. Critical Value/emergent results were called by telephone at the time of interpretation on 08/18/2019 at 10:22 pm to Dr. Ezequiel Essex , who verbally acknowledged these results. Electronically Signed   By: Inez Catalina M.D.   On: 08/18/2019 22:23    Procedures .Critical Care Performed by: Ezequiel Essex, MD Authorized by: Ezequiel Essex, MD   Critical care provider statement:    Critical care time (minutes):  45   Critical care was necessary to treat or prevent imminent or life-threatening deterioration of the following conditions:  Respiratory failure   Critical care was time spent personally by me on the following activities:  Discussions with consultants, evaluation of patient's response to treatment, examination of patient, ordering and performing treatments and interventions, ordering and review of laboratory studies, ordering and review of radiographic studies, pulse oximetry, re-evaluation of patient's condition, obtaining history from patient or surrogate and review of old charts CHEST TUBE INSERTION  Date/Time: 08/18/2019 11:38 PM Performed by: Ezequiel Essex, MD Authorized by: Ezequiel Essex, MD   Consent:    Consent obtained:  Emergent situation and verbal   Risks discussed:  Bleeding, damage to surrounding structures, infection, incomplete drainage, nerve damage  and pain Pre-procedure details:  Skin preparation:  Betadine   Preparation: Patient was prepped and draped in the usual sterile fashion   Sedation:    Sedation type:  Moderate (conscious) sedation and anxiolysis Anesthesia (see MAR for exact dosages):    Anesthesia method:  Local infiltration   Local anesthetic:  Lidocaine 2% WITH epi Procedure details:    Placement location:  L lateral   Scalpel size:  11   Tube size (Fr):  Minicatheter   Ultrasound guidance: no     Tension pneumothorax: yes     Tube connected to:  Water seal   Drainage characteristics:  Air only   Suture material:  0 silk   Dressing:  4x4 sterile gauze and petrolatum-impregnated gauze Post-procedure details:    Post-insertion x-ray findings: tube in good position     Patient tolerance of procedure:  Tolerated well, no immediate complications   (including critical care time)  Medications Ordered in ED Medications  albuterol (VENTOLIN HFA) 108 (90 Base) MCG/ACT inhaler 4 puff (4 puffs Inhalation Given 08/18/19 2217)  methylPREDNISolone sodium succinate (SOLU-MEDROL) 125 mg/2 mL injection 125 mg (125 mg Intravenous Given 08/18/19 2217)  magnesium sulfate IVPB 2 g 50 mL (0 g Intravenous Stopped 08/18/19 2252)  morphine 4 MG/ML injection 4 mg (4 mg Intravenous Given 08/18/19 2216)  midazolam (VERSED) injection 1 mg (1 mg Intravenous Given 08/18/19 2218)  lidocaine-EPINEPHrine (XYLOCAINE W/EPI) 2 %-1:200000 (PF) injection 20 mL (20 mLs Infiltration Given 08/18/19 2218)  midazolam (VERSED) injection 1 mg (1 mg Intravenous Given 08/18/19 2234)  HYDROmorphone (DILAUDID) injection 1 mg (1 mg Intravenous Given 08/18/19 2251)  HYDROmorphone (DILAUDID) injection 1 mg (1 mg Intravenous Given 08/18/19 2243)     Initial Impression / Assessment and Plan / ED Course  I have reviewed the triage vital signs and the nursing notes.  Pertinent labs & imaging results that were available during my care of the patient were  reviewed by me and considered in my medical decision making (see chart for details).       Sudden onset of chest pain or shortness of breath without breath sounds in the left.  Concern for pneumothorax.  Patient given bronchodilators and steroids as well as magnesium.  Chest x-ray confirms large left-sided pneumothorax with tension component.  Chest tube placed.  Patient with improvement after expansion of the lung.  Repeat chest x-ray shows chest tube in good position.  EKG without acute ischemia.  Troponin and D-dimer are negative.  Discussed with Dr. Constance Haw of general surgery who will consult and manage chest tube.  Admission to medical service discussed with Dr. Myna Hidalgo.  Tilford Mollinedo was evaluated in Emergency Department on 08/18/2019 for the symptoms described in the history of present illness. He was evaluated in the context of the global COVID-19 pandemic, which necessitated consideration that the patient might be at risk for infection with the SARS-CoV-2 virus that causes COVID-19. Institutional protocols and algorithms that pertain to the evaluation of patients at risk for COVID-19 are in a state of rapid change based on information released by regulatory bodies including the CDC and federal and state organizations. These policies and algorithms were followed during the patient's care in the ED.   Final Clinical Impressions(s) / ED Diagnoses   Final diagnoses:  SOB (shortness of breath)  Spontaneous tension pneumothorax    ED Discharge Orders    None       Trask Vosler, Annie Main, MD 08/19/19 [phone]

## 2019-08-19 ENCOUNTER — Encounter (HOSPITAL_COMMUNITY): Payer: Self-pay | Admitting: Family Medicine

## 2019-08-19 DIAGNOSIS — Z9689 Presence of other specified functional implants: Secondary | ICD-10-CM

## 2019-08-19 DIAGNOSIS — I10 Essential (primary) hypertension: Secondary | ICD-10-CM | POA: Diagnosis present

## 2019-08-19 DIAGNOSIS — D751 Secondary polycythemia: Secondary | ICD-10-CM | POA: Diagnosis present

## 2019-08-19 DIAGNOSIS — J441 Chronic obstructive pulmonary disease with (acute) exacerbation: Secondary | ICD-10-CM | POA: Diagnosis present

## 2019-08-19 LAB — CBC WITH DIFFERENTIAL/PLATELET
Abs Immature Granulocytes: 0.04 10*3/uL (ref 0.00–0.07)
Basophils Absolute: 0 10*3/uL (ref 0.0–0.1)
Basophils Relative: 0 %
Eosinophils Absolute: 0 10*3/uL (ref 0.0–0.5)
Eosinophils Relative: 0 %
HCT: 56.7 % — ABNORMAL HIGH (ref 39.0–52.0)
Hemoglobin: 19.4 g/dL — ABNORMAL HIGH (ref 13.0–17.0)
Immature Granulocytes: 0 %
Lymphocytes Relative: 6 %
Lymphs Abs: 0.8 10*3/uL (ref 0.7–4.0)
MCH: 34.2 pg — ABNORMAL HIGH (ref 26.0–34.0)
MCHC: 34.2 g/dL (ref 30.0–36.0)
MCV: 99.8 fL (ref 80.0–100.0)
Monocytes Absolute: 0.1 10*3/uL (ref 0.1–1.0)
Monocytes Relative: 1 %
Neutro Abs: 11.3 10*3/uL — ABNORMAL HIGH (ref 1.7–7.7)
Neutrophils Relative %: 93 %
Platelets: 230 10*3/uL (ref 150–400)
RBC: 5.68 MIL/uL (ref 4.22–5.81)
RDW: 13.5 % (ref 11.5–15.5)
WBC: 12.3 10*3/uL — ABNORMAL HIGH (ref 4.0–10.5)
nRBC: 0 % (ref 0.0–0.2)

## 2019-08-19 LAB — BASIC METABOLIC PANEL
Anion gap: 14 (ref 5–15)
BUN: 9 mg/dL (ref 8–23)
CO2: 23 mmol/L (ref 22–32)
Calcium: 9.3 mg/dL (ref 8.9–10.3)
Chloride: 100 mmol/L (ref 98–111)
Creatinine, Ser: 0.7 mg/dL (ref 0.61–1.24)
GFR calc Af Amer: >60 mL/min (ref >60)
GFR calc non Af Amer: >60 mL/min (ref >60)
Glucose, Bld: 210 mg/dL — ABNORMAL HIGH (ref 70–99)
Potassium: 3.9 mmol/L (ref 3.5–5.1)
Sodium: 137 mmol/L (ref 135–145)

## 2019-08-19 LAB — HIV ANTIBODY (ROUTINE TESTING W REFLEX): HIV Screen 4th Generation wRfx: NONREACTIVE

## 2019-08-19 LAB — SARS CORONAVIRUS 2 (TAT 6-24 HRS): SARS Coronavirus 2: NEGATIVE

## 2019-08-19 LAB — TROPONIN I (HIGH SENSITIVITY): Troponin I (High Sensitivity): 5 ng/L (ref <18)

## 2019-08-19 LAB — MAGNESIUM: Magnesium: 2.1 mg/dL (ref 1.7–2.4)

## 2019-08-19 LAB — CARBOXYHEMOGLOBIN - COOX: Carboxyhemoglobin: 2.2 % — ABNORMAL HIGH (ref 0.5–1.5)

## 2019-08-19 LAB — GLUCOSE, CAPILLARY: Glucose-Capillary: 182 mg/dL — ABNORMAL HIGH (ref 70–99)

## 2019-08-19 MED ORDER — ONDANSETRON HCL 4 MG/2ML IJ SOLN: 4.0000 mg | Freq: Four times a day (QID) | INTRAMUSCULAR | Status: DC | PRN

## 2019-08-19 MED ORDER — MOMETASONE FURO-FORMOTEROL FUM 100-5 MCG/ACT IN AERO
2.0000 | INHALATION_SPRAY | Freq: Two times a day (BID) | RESPIRATORY_TRACT | Status: DC
  Administered 2019-08-19 – 2019-08-20 (×3): 2 via RESPIRATORY_TRACT
  Filled 2019-08-19 (×2): qty 8.8

## 2019-08-19 MED ORDER — HYDROMORPHONE HCL 2 MG/ML IJ SOLN: 1.5000 mg | INTRAMUSCULAR | Status: DC | PRN

## 2019-08-19 MED ORDER — METHYLPREDNISOLONE SODIUM SUCC 125 MG IJ SOLR
60.0000 mg | Freq: Four times a day (QID) | INTRAMUSCULAR | Status: DC
  Administered 2019-08-19 – 2019-08-20 (×6): 60 mg via INTRAVENOUS
  Filled 2019-08-19 (×6): qty 2

## 2019-08-19 MED ORDER — FAMOTIDINE 20 MG PO TABS
20.0000 mg | ORAL_TABLET | Freq: Every day | ORAL | Status: DC
  Administered 2019-08-19 – 2019-08-22 (×4): 20 mg via ORAL
  Filled 2019-08-19 (×4): qty 1

## 2019-08-19 MED ORDER — KETOROLAC TROMETHAMINE 15 MG/ML IJ SOLN
15.0000 mg | Freq: Four times a day (QID) | INTRAMUSCULAR | Status: DC
  Administered 2019-08-19 – 2019-08-22 (×13): 15 mg via INTRAVENOUS
  Filled 2019-08-19 (×13): qty 1

## 2019-08-19 MED ORDER — DIPHENHYDRAMINE HCL 25 MG PO CAPS
25.0000 mg | ORAL_CAPSULE | Freq: Four times a day (QID) | ORAL | Status: DC | PRN
  Administered 2019-08-19 – 2019-08-22 (×6): 25 mg via ORAL
  Filled 2019-08-19 (×6): qty 1

## 2019-08-19 MED ORDER — IPRATROPIUM-ALBUTEROL 20-100 MCG/ACT IN AERS
1.0000 | INHALATION_SPRAY | Freq: Four times a day (QID) | RESPIRATORY_TRACT | Status: DC | PRN
  Administered 2019-08-20: 1 via RESPIRATORY_TRACT
  Filled 2019-08-19: qty 4

## 2019-08-19 MED ORDER — HYDROMORPHONE HCL 1 MG/ML IJ SOLN
1.0000 mg | INTRAMUSCULAR | Status: DC | PRN
  Administered 2019-08-19: 1 mg via INTRAVENOUS
  Filled 2019-08-19: qty 1

## 2019-08-19 MED ORDER — CHLORHEXIDINE GLUCONATE CLOTH 2 % EX PADS
6.0000 | MEDICATED_PAD | Freq: Every day | CUTANEOUS | Status: DC
  Administered 2019-08-19 – 2019-08-21 (×3): 6 via TOPICAL

## 2019-08-19 MED ORDER — ALBUTEROL SULFATE HFA 108 (90 BASE) MCG/ACT IN AERS: 2.0000 | INHALATION_SPRAY | RESPIRATORY_TRACT | Status: DC | PRN

## 2019-08-19 MED ORDER — IPRATROPIUM-ALBUTEROL 20-100 MCG/ACT IN AERS: 1.0000 | INHALATION_SPRAY | Freq: Four times a day (QID) | RESPIRATORY_TRACT | Status: DC | PRN

## 2019-08-19 MED ORDER — SODIUM CHLORIDE 0.9 % IV SOLN
500.0000 mg | INTRAVENOUS | Status: DC
  Administered 2019-08-19 – 2019-08-20 (×2): 500 mg via INTRAVENOUS
  Filled 2019-08-19 (×2): qty 500

## 2019-08-19 MED ORDER — SODIUM CHLORIDE 0.9% FLUSH
3.0000 mL | Freq: Two times a day (BID) | INTRAVENOUS | Status: DC
  Administered 2019-08-19 – 2019-08-22 (×6): 3 mL via INTRAVENOUS

## 2019-08-19 MED ORDER — POTASSIUM CHLORIDE IN NACL 20-0.9 MEQ/L-% IV SOLN
INTRAVENOUS | Status: AC
  Administered 2019-08-19: 06:00:00 via INTRAVENOUS
  Filled 2019-08-19: qty 1000

## 2019-08-19 MED ORDER — ACETAMINOPHEN 325 MG PO TABS: 650.0000 mg | ORAL_TABLET | Freq: Four times a day (QID) | ORAL | Status: DC | PRN

## 2019-08-19 MED ORDER — GABAPENTIN 300 MG PO CAPS
600.0000 mg | ORAL_CAPSULE | Freq: Three times a day (TID) | ORAL | Status: DC
  Administered 2019-08-19 – 2019-08-22 (×11): 600 mg via ORAL
  Filled 2019-08-19 (×11): qty 2

## 2019-08-19 MED ORDER — IPRATROPIUM-ALBUTEROL 20-100 MCG/ACT IN AERS
1.0000 | INHALATION_SPRAY | Freq: Four times a day (QID) | RESPIRATORY_TRACT | Status: DC
  Administered 2019-08-19: 02:00:00 1 via RESPIRATORY_TRACT
  Filled 2019-08-19: qty 4

## 2019-08-19 MED ORDER — HYDROCODONE-ACETAMINOPHEN 5-325 MG PO TABS
1.0000 | ORAL_TABLET | ORAL | Status: DC | PRN
  Administered 2019-08-19: 2 via ORAL
  Administered 2019-08-19: 1 via ORAL
  Administered 2019-08-19 – 2019-08-20 (×5): 2 via ORAL
  Administered 2019-08-21: 06:00:00 1 via ORAL
  Administered 2019-08-21: 2 via ORAL
  Filled 2019-08-19 (×3): qty 2
  Filled 2019-08-19 (×3): qty 1
  Filled 2019-08-19 (×4): qty 2

## 2019-08-19 MED ORDER — AMLODIPINE BESYLATE 5 MG PO TABS
5.0000 mg | ORAL_TABLET | Freq: Every day | ORAL | Status: DC
  Administered 2019-08-19 – 2019-08-22 (×4): 5 mg via ORAL
  Filled 2019-08-19 (×4): qty 1

## 2019-08-19 MED ORDER — NICOTINE 14 MG/24HR TD PT24
14.0000 mg | MEDICATED_PATCH | Freq: Every day | TRANSDERMAL | Status: DC
  Administered 2019-08-19 – 2019-08-22 (×4): 14 mg via TRANSDERMAL
  Filled 2019-08-19 (×4): qty 1

## 2019-08-19 NOTE — Progress Notes (Addendum)
PROGRESS NOTE    Glen Cobb  [id number] DOB: December 31, 1955 DOA: 08/18/2019 PCP: [name], MD     Brief Narrative:  As per H&P by Dr. Myna Hidalgo on 08/18/2019 63 y.o. male with medical history significant for COPD, spontaneous right-sided pneumothorax roughly 40 years ago, spinal stenosis, and tobacco abuse in the process of quitting, now presenting with acute onset of left-sided chest pain and shortness of breath.  The patient reports that for the past several days he has had increased cough and increased sputum production without any fevers, chills, or chest pain.  He was at rest this evening, watching TV, when he developed acute onset of severe left-sided chest pain and shortness of breath.  He had not been coughing at the time and denies any recent trauma or procedure.  He describes similar experience roughly 40 years ago when he had a spontaneous right-sided pneumothorax.  ED Course: Upon arrival to the ED, patient is found to be saturating upper 80s on room air, tachypneic in the mid 20s, tachycardic to 120, and with stable blood pressure.  EKG features sinus tachycardia with rate 122 and nonspecific IVCD.  Chest x-ray is concerning for large left-sided pneumothorax with tension component and shift to the right.  Chemistry panel features a mild hypokalemia.  CBC is notable for hemoglobin of 20.9 and hematocrit 60.9.  High-sensitivity troponins and BNP are normal.  Pigtail catheter was inserted into the left pleural space and repeat chest x-ray demonstrates reexpansion of the left lung with small residual left apical pneumothorax and improved mediastinal shift.  Patient was treated with albuterol, Solu-Medrol, Dilaudid, and morphine in the emergency department.  Surgery was consulted by the ED physician and COVID-19 screening test is still pending.  Assessment & Plan: 1-spontaneous tension pneumothorax -Patient with prior history of a spontaneous pneumothorax, approximately 40 years ago.  -Most likely from blood pressure of underlying bullae in the setting of COPD exacerbation, coughing spells and ongoing tobacco abuse. -Chest tube in place -Will follow recommendations and care as dictated by general surgery. -Continue treatment for COPD exacerbation -Patient advised to quit smoking -Continue oxygen supplementation.  2-COPD with acute exacerbation ([place]) -Continue bronchodilators, steroids and antibiotics. -Continue to wean oxygen supplementation as tolerated -Flutter valve nebulizer management will be order once neg Covid test acquired.  3-Hypertension -Stable overall. -Will continue amlodipine.  4-idiopathic neuropathy/chronic pain -Continue Neurontin 3 times daily. -continue PRN analgesics  5-Polycythemia -In the setting of chronic tobacco abuse -Continue to monitor blood count.  6-GI prophylaxis/GERD -Continue Pepcid.  7-tobacco abuse -Cessation counseling has been provided -Continue nicotine patch.  DVT prophylaxis:  Code Status: Full code Family Communication: No family at bedside Disposition Plan: Remains inpatient, will transfer to [place] bed, follow recommendation by general surgery for further care of the chest tube and removal.  Continue treatment for COPD exacerbation.  Consultants:   General surgery  Procedures:   See below for x-ray reports.  Antimicrobials:  Anti-infectives (From admission, onward)   Start     Dose/Rate Route Frequency Ordered Stop   08/19/19 0200  azithromycin (ZITHROMAX) 500 mg in sodium chloride 0.9 % 250 mL IVPB     500 mg 250 mL/hr over 60 Minutes Intravenous Every 24 hours 08/19/19 0120        Subjective: Afebrile, still easily short of breath and expressing chest pain.  Chest tube in place, requiring oxygen supplementation and with positive tachypnea.  Mild difficulty speaking in full sentences.  Objective: Vitals:   08/19/19 1200 08/19/19 1300 08/19/19  1400 08/19/19 1500  BP: (!) 149/87 (!) 145/74  140/77 133/84  Pulse: 90 80 69 76  Resp: (!) 29 (!) 27 (!) 27 (!) 27  Temp:      TempSrc:      SpO2: 96% 93% 93% 95%  Weight:      Height:        Intake/Output Summary (Last 24 hours) at 08/19/2019 1604 Last data filed at 08/19/2019 1500 Gross per 24 hour  Intake 307.62 ml  Output 900 ml  Net -592.38 ml   Filed Weights   08/18/19 2208 08/19/19 0640  Weight: 75.8 kg 75.3 kg    Examination: General exam: Alert, awake, oriented x 3; still complaining of left-sided chest pain, shortness of breath and need of oxygen supplementation.  Patient is afebrile. Respiratory system: Decreased breath sounds on the left side, positive expiratory wheezing and positive rhonchi.  Shallow breathing secondary to pain.  Left-sided chest tube in place.  Cardiovascular system: RRR. No murmurs, rubs, gallops.  No JVD Gastrointestinal system: Abdomen is nondistended, soft and nontender. No organomegaly or masses felt. Normal bowel sounds heard. Central nervous system: Alert and oriented. No focal neurological deficits. Extremities: No cyanosis or clubbing Skin: No rashes, no petechiae.  Left-sided chest tube in place. Psychiatry: Judgement and insight appear normal. Mood & affect appropriate.     Data Reviewed: I have personally reviewed following labs and imaging studies  CBC: Recent Labs  Lab 08/18/19 2210 08/19/19 0343  WBC 9.1 12.3*  NEUTROABS 3.8 11.3*  HGB 20.9* 19.4*  HCT 60.9* 56.7*  MCV 97.3 99.8  PLT 279 [id number]   Basic Metabolic Panel: Recent Labs  Lab 08/18/19 2210 08/19/19 0343  NA 139 137  K 3.4* 3.9  CL 99 100  CO2 22 23  GLUCOSE 124* 210*  BUN 7* 9  CREATININE 0.71 0.70  CALCIUM 10.2 9.3  MG  --  2.1   GFR: Estimated Creatinine Clearance: 97 mL/min (by C-G formula based on SCr of 0.7 mg/dL).   Liver Function Tests: Recent Labs  Lab 08/18/19 2210  AST 38  ALT 34  ALKPHOS 96  BILITOT 0.9  PROT 8.8*  ALBUMIN 4.5   CBG: Recent Labs  Lab 08/19/19 0813   GLUCAP 182*    Radiology Studies: Dg Chest 1v Repeat Same Day  Result Date: 08/18/2019 CLINICAL DATA:  Chest tube placement EXAM: CHEST - 1 VIEW SAME DAY COMPARISON:  Radiograph 08/18/2019 FINDINGS: Interval placement of a left-sided pigtail chest tube with re-expansion of the left lung. Small residual apical pneumothorax is present. Some residual hazy opacity in the bases may reflect atelectatic lung or re-expansion edema in the left lung. There are biapical areas of scarring and basilar airways thickening. IMPRESSION: Re-expansion of the left lung post placement of a pigtail pleural drain. Small residual left apical pneumothorax. Improved mediastinal shift. Extensive left chest wall subcutaneous emphysema. Electronically Signed   By: Lovena Le M.D.   On: 08/18/2019 22:53   Dg Chest Portable 1 View  Result Date: 08/18/2019 CLINICAL DATA:  Shortness of breath, decreased breath sounds on the left. EXAM: PORTABLE CHEST 1 VIEW COMPARISON:  None. FINDINGS: Cardiac shadow is within normal limits. Aortic calcifications are seen. Large left-sided pneumothorax is noted of at least 60% with mild mediastinal shift to the right. No focal infiltrate is seen. Some scarring is noted in the apices bilateral likely related to emphysematous changes and bullous disease. IMPRESSION: Large left-sided pneumothorax with tension component and shift to the right. Critical  Value/emergent results were called by telephone at the time of interpretation on 08/18/2019 at 10:22 pm to Dr. Ezequiel Essex , who verbally acknowledged these results. Electronically Signed   By: Inez Catalina M.D.   On: 08/18/2019 22:23    Scheduled Meds: . Chlorhexidine Gluconate Cloth  6 each Topical Daily  . famotidine  20 mg Oral Daily  . gabapentin  600 mg Oral TID  . ketorolac  15 mg Intravenous Q6H  . methylPREDNISolone (SOLU-MEDROL) injection  60 mg Intravenous Q6H  . mometasone-formoterol  2 puff Inhalation BID  . nicotine  14 mg  Transdermal Daily  . sodium chloride flush  3 mL Intravenous Q12H   Continuous Infusions: . azithromycin Stopped (08/19/19 0512)     LOS: 1 day    Time spent: 30 minutes.    Barton Dubois, MD [facility] Pager [phone]   08/19/2019, 4:04 PM

## 2019-08-19 NOTE — Progress Notes (Signed)
Reviewing epic tonight. Patient on list. ED had called me about him last night but he unfortunately never made it on my list as a consult, so I assumed he went to [facility].  CXR ordered for the AM. Will see tomorrow.  Curlene Labrum, MD

## 2019-08-19 NOTE — Progress Notes (Signed)
Report given to [place]. Patient transferred to [address].

## 2019-08-19 NOTE — ED Notes (Signed)
Rising and falling of fluid in the pleuravac noted.

## 2019-08-19 NOTE — H&P (Signed)
History and Physical    Glen Cobb [id number] DOB: 08-Jun-1956 DOA: 08/18/2019  PCP: [name], MD   Patient coming from: Home   Chief Complaint: SOB, chest pain   HPI: Glen Cobb is a 63 y.o. male with medical history significant for COPD, spontaneous right-sided pneumothorax roughly 40 years ago, spinal stenosis, and tobacco abuse in the process of quitting, now presenting with acute onset of left-sided chest pain and shortness of breath.  The patient reports that for the past several days he has had increased cough and increased sputum production without any fevers, chills, or chest pain.  He was at rest this evening, watching TV, when he developed acute onset of severe left-sided chest pain and shortness of breath.  He had not been coughing at the time and denies any recent trauma or procedure.  He describes similar experience roughly 40 years ago when he had a spontaneous right-sided pneumothorax.  ED Course: Upon arrival to the ED, patient is found to be saturating upper 80s on room air, tachypneic in the mid 20s, tachycardic to 120, and with stable blood pressure.  EKG features sinus tachycardia with rate 122 and nonspecific IVCD.  Chest x-ray is concerning for large left-sided pneumothorax with tension component and shift to the right.  Chemistry panel features a mild hypokalemia.  CBC is notable for hemoglobin of 20.9 and hematocrit 60.9.  High-sensitivity troponins and BNP are normal.  Pigtail catheter was inserted into the left pleural space and repeat chest x-ray demonstrates reexpansion of the left lung with small residual left apical pneumothorax and improved mediastinal shift.  Patient was treated with albuterol, Solu-Medrol, Dilaudid, and morphine in the emergency department.  Surgery was consulted by the ED physician and COVID-19 screening test has not yet resulted.  Review of Systems:  All other systems reviewed and apart from HPI, are negative.  Past Medical  History:  Diagnosis Date  . Arthritis   . Cancer ([place])    bladder cancer x 2  . Cataract    MD just watching  . COPD (chronic obstructive pulmonary disease) ([place])   . Emphysema of lung ([place])   . FH: migraines    last one 1 yr ago - OTC med prn  . Gout   . HSV infection   . Hyperlipidemia    no med. diet  . Hypertension   . Kidney stone    passed stone - no surgery  . Osteoporosis   . Sleep apnea    Does not use CPAP nightly.    Past Surgical History:  Procedure Laterality Date  . arm surgery Right    with metal plate  . BLADDER SURGERY     x 2 for bladder cancer   . FOOT SURGERY Right   . SURGERY SCROTAL / TESTICULAR     cyst removed from testicular     reports that he has been smoking cigarettes. He has a 11.50 pack-year smoking history. His smokeless tobacco use includes snuff. He reports current alcohol use of about 4.0 - 5.0 standard drinks of alcohol per week. He reports current drug use. Drug: Marijuana.  Allergies  Allergen Reactions  . Codeine Itching  . Hydrocodone-Acetaminophen Itching  . Other Hives    Strawberries causes hives    Family History  Adopted: Yes     Prior to Admission medications   Medication Sig Start Date End Date Taking? Authorizing Provider  ACYCLOVIR PO Take by mouth daily as needed.    [provider]  albuterol-ipratropium (COMBIVENT) 18-103 MCG/ACT inhaler Inhale 2 puffs into the lungs every 6 (six) hours as needed.    [provider]  allopurinol (ZYLOPRIM) 100 MG tablet Take 100 mg by mouth daily.    [provider]  amLODipine (NORVASC) 5 MG tablet Take 5 mg by mouth daily.    [provider]  budesonide-formoterol (SYMBICORT) 160-4.5 MCG/ACT inhaler Inhale 2 puffs into the lungs 2 (two) times daily.    [provider]  Cholecalciferol (VITAMIN D3) 50 MCG (2000 UT) capsule Take by mouth.    [provider]  gabapentin (NEURONTIN) 600 MG tablet Take 600 mg by mouth 3  (three) times daily.    [provider]  HYDROcodone-acetaminophen (NORCO/VICODIN) 5-325 MG per tablet 1 or 2 po q4h prn pain. Please take with food Patient not taking: Reported on 10/28/2018 09/24/12   Lily Kocher, PA-C  ibuprofen (ADVIL,MOTRIN) 200 MG tablet Take by mouth.    [provider]    Physical Exam: Vitals:   08/18/19 2208 08/18/19 2230 08/18/19 2300  BP:  120/77 128/86  Pulse:  (!) 118 85  Resp:  (!) 23 16  SpO2:  (!) 89% 90%  Weight: 75.8 kg    Height: 5\' 9"  (1.753 m)      Constitutional: NAD, calm  Eyes: PERTLA, lids and conjunctivae normal ENMT: Mucous membranes are moist. Posterior pharynx clear of any exudate or lesions.   Neck: normal, supple, no masses, no thyromegaly Respiratory: Prolonged expiratory phase, occasional wheeze, no crackles. Normal respiratory effort.  Cardiovascular: S1 & S2 heard, regular rate and rhythm. No extremity edema.    Abdomen: No distension, no tenderness, soft. Bowel sounds active.  Musculoskeletal: no clubbing / cyanosis. No joint deformity upper and lower extremities.   Skin: no significant rashes, lesions, ulcers. Warm, dry, well-perfused. Neurologic: No gross facial asymmetry. Sensation intact. Moving all extremities.  Psychiatric: Alert and oriented to person, place, and situation. Pleasant, cooperative.      Labs on Admission: I have personally reviewed following labs and imaging studies  CBC: Recent Labs  Lab 08/18/19 2210  WBC 9.1  NEUTROABS 3.8  HGB 20.9*  HCT 60.9*  MCV 97.3  PLT 123XX123   Basic Metabolic Panel: Recent Labs  Lab 08/18/19 2210  NA 139  K 3.4*  CL 99  CO2 22  GLUCOSE 124*  BUN 7*  CREATININE 0.71  CALCIUM 10.2   GFR: Estimated Creatinine Clearance: 97 mL/min (by C-G formula based on SCr of 0.71 mg/dL). Liver Function Tests: Recent Labs  Lab 08/18/19 2210  AST 38  ALT 34  ALKPHOS 96  BILITOT 0.9  PROT 8.8*  ALBUMIN 4.5   No results for input(s): LIPASE, AMYLASE in  the last 168 hours. No results for input(s): AMMONIA in the last 168 hours. Coagulation Profile: No results for input(s): INR, PROTIME in the last 168 hours. Cardiac Enzymes: No results for input(s): CKTOTAL, CKMB, CKMBINDEX, TROPONINI in the last 168 hours. BNP (last 3 results) No results for input(s): PROBNP in the last 8760 hours. HbA1C: No results for input(s): HGBA1C in the last 72 hours. CBG: No results for input(s): GLUCAP in the last 168 hours. Lipid Profile: No results for input(s): CHOL, HDL, LDLCALC, TRIG, CHOLHDL, LDLDIRECT in the last 72 hours. Thyroid Function Tests: No results for input(s): TSH, T4TOTAL, FREET4, T3FREE, THYROIDAB in the last 72 hours. Anemia Panel: No results for input(s): VITAMINB12, FOLATE, FERRITIN, TIBC, IRON, RETICCTPCT in the last 72 hours. Urine analysis: No results found  for: COLORURINE, APPEARANCEUR, LABSPEC, PHURINE, GLUCOSEU, HGBUR, BILIRUBINUR, KETONESUR, PROTEINUR, UROBILINOGEN, NITRITE, LEUKOCYTESUR Sepsis Labs: @LABRCNTIP (procalcitonin:4,lacticidven:4) )No results found for this or any previous visit (from the past 240 hour(s)).   Radiological Exams on Admission: Dg Chest 1v Repeat Same Day  Result Date: 08/18/2019 CLINICAL DATA:  Chest tube placement EXAM: CHEST - 1 VIEW SAME DAY COMPARISON:  Radiograph 08/18/2019 FINDINGS: Interval placement of a left-sided pigtail chest tube with re-expansion of the left lung. Small residual apical pneumothorax is present. Some residual hazy opacity in the bases may reflect atelectatic lung or re-expansion edema in the left lung. There are biapical areas of scarring and basilar airways thickening. IMPRESSION: Re-expansion of the left lung post placement of a pigtail pleural drain. Small residual left apical pneumothorax. Improved mediastinal shift. Extensive left chest wall subcutaneous emphysema. Electronically Signed   By: Lovena Le M.D.   On: 08/18/2019 22:53   Dg Chest Portable 1 View  Result  Date: 08/18/2019 CLINICAL DATA:  Shortness of breath, decreased breath sounds on the left. EXAM: PORTABLE CHEST 1 VIEW COMPARISON:  None. FINDINGS: Cardiac shadow is within normal limits. Aortic calcifications are seen. Large left-sided pneumothorax is noted of at least 60% with mild mediastinal shift to the right. No focal infiltrate is seen. Some scarring is noted in the apices bilateral likely related to emphysematous changes and bullous disease. IMPRESSION: Large left-sided pneumothorax with tension component and shift to the right. Critical Value/emergent results were called by telephone at the time of interpretation on 08/18/2019 at 10:22 pm to Dr. Ezequiel Essex , who verbally acknowledged these results. Electronically Signed   By: Inez Catalina M.D.   On: 08/18/2019 22:23    EKG: Independently reviewed. Sinus tachycardia (rate 122), non-specific IVCD.   Assessment/Plan   1. Spontaneous tension pneumothorax  - Presents with acute-onset of SOB and left-sided chest pain while at rest and is found to have large left PTX with tension component  - Pigtail catheter was placed in ED with reexpansion of left lung, small residual apical PTX, and improved mediastinal shift  - Surgery consulted by ED for chest tube management    2. COPD with acute exacerbation  - Patient reports several days of increased cough and increased sputum production  - He was treated with 125 mg IV Solu-Medrol and albuterol in ED  - Check sputum culture, continue systemic steroid, continue ICS/LABA and as-needed combivent    3. Polycythemia  - Hgb is 20.9 and Hct 60.9 on admission with normal WBC and platelets, no prior CBC available  - He is a smoker with COPD, appears euvolemic  - Check EPO and carboxyhemoglobin, repeat CBC in am     PPE: Mask, face shield  DVT prophylaxis: SCD's  Code Status: Full  Family Communication: Brother updated at bedside Consults called: Surgery consulted by ED physician  Admission  status: Inpatient     Vianne Bulls, MD [facility] Pager [phone]  If 7PM-7AM, please contact night-coverage [email or web address] Password [email or web address]  08/19/2019, 1:30 AM

## 2019-08-20 ENCOUNTER — Inpatient Hospital Stay (HOSPITAL_COMMUNITY): Payer: No Typology Code available for payment source

## 2019-08-20 LAB — GLUCOSE, CAPILLARY: Glucose-Capillary: 145 mg/dL — ABNORMAL HIGH (ref 70–99)

## 2019-08-20 LAB — MRSA PCR SCREENING: MRSA by PCR: NEGATIVE

## 2019-08-20 LAB — ERYTHROPOIETIN: Erythropoietin: 27.1 m[IU]/mL — ABNORMAL HIGH (ref 2.6–18.5)

## 2019-08-20 MED ORDER — ARFORMOTEROL TARTRATE 15 MCG/2ML IN NEBU
15.0000 ug | INHALATION_SOLUTION | Freq: Two times a day (BID) | RESPIRATORY_TRACT | Status: DC
  Administered 2019-08-20 – 2019-08-22 (×4): 15 ug via RESPIRATORY_TRACT
  Filled 2019-08-20 (×4): qty 2

## 2019-08-20 MED ORDER — GUAIFENESIN-DM 100-10 MG/5ML PO SYRP: 5.0000 mL | ORAL_SOLUTION | Freq: Four times a day (QID) | ORAL | Status: DC | PRN

## 2019-08-20 MED ORDER — METHYLPREDNISOLONE SODIUM SUCC 125 MG IJ SOLR
60.0000 mg | Freq: Three times a day (TID) | INTRAMUSCULAR | Status: DC
  Administered 2019-08-20 – 2019-08-22 (×6): 60 mg via INTRAVENOUS
  Filled 2019-08-20 (×6): qty 2

## 2019-08-20 MED ORDER — BUDESONIDE 0.5 MG/2ML IN SUSP
0.5000 mg | Freq: Two times a day (BID) | RESPIRATORY_TRACT | Status: DC
  Administered 2019-08-20 – 2019-08-22 (×4): 0.5 mg via RESPIRATORY_TRACT
  Filled 2019-08-20 (×4): qty 2

## 2019-08-20 MED ORDER — IPRATROPIUM-ALBUTEROL 0.5-2.5 (3) MG/3ML IN SOLN
3.0000 mL | Freq: Four times a day (QID) | RESPIRATORY_TRACT | Status: DC
  Administered 2019-08-20 – 2019-08-21 (×2): 3 mL via RESPIRATORY_TRACT
  Filled 2019-08-20 (×2): qty 3

## 2019-08-20 MED ORDER — DOXYCYCLINE HYCLATE 100 MG PO TABS
100.0000 mg | ORAL_TABLET | Freq: Two times a day (BID) | ORAL | Status: DC
  Administered 2019-08-20 – 2019-08-22 (×4): 100 mg via ORAL
  Filled 2019-08-20 (×4): qty 1

## 2019-08-20 NOTE — Progress Notes (Signed)
Chest tube remains to suction, 20 mmHg continuous. Patient c/o left sided chest pain, PRN Norco 2 tabs given with some relief. Will continue to monitor.

## 2019-08-20 NOTE — Progress Notes (Signed)
PROGRESS NOTE    Glen Cobb  [id number] DOB: 13-Mar-1956 DOA: 08/18/2019 PCP: Glen Milan, MD     Brief Narrative:  As per H&P by Dr. Myna Cobb on 08/18/2019 63 y.o. male with medical history significant for COPD, spontaneous right-sided pneumothorax roughly 40 years ago, spinal stenosis, and tobacco abuse in the process of quitting, now presenting with acute onset of left-sided chest pain and shortness of breath.  The patient reports that for the past several days he has had increased cough and increased sputum production without any fevers, chills, or chest pain.  He was at rest this evening, watching TV, when he developed acute onset of severe left-sided chest pain and shortness of breath.  He had not been coughing at the time and denies any recent trauma or procedure.  He describes similar experience roughly 40 years ago when he had a spontaneous right-sided pneumothorax.  ED Course: Upon arrival to the ED, patient is found to be saturating upper 80s on room air, tachypneic in the mid 20s, tachycardic to 120, and with stable blood pressure.  EKG features sinus tachycardia with rate 122 and nonspecific IVCD.  Chest x-ray is concerning for large left-sided pneumothorax with tension component and shift to the right.  Chemistry panel features a mild hypokalemia.  CBC is notable for hemoglobin of 20.9 and hematocrit 60.9.  High-sensitivity troponins and BNP are normal.  Pigtail catheter was inserted into the left pleural space and repeat chest x-ray demonstrates reexpansion of the left lung with small residual left apical pneumothorax and improved mediastinal shift.  Patient was treated with albuterol, Solu-Medrol, Dilaudid, and morphine in the emergency department.  Surgery was consulted by the ED physician and COVID-19 screening test is still pending.  Assessment & Plan: 1-spontaneous tension pneumothorax -Patient with prior history of a spontaneous pneumothorax, approximately 40 years  ago. -Most likely from blood pressure of underlying bullae in the setting of COPD exacerbation, coughing spells and ongoing tobacco abuse. -Chest tube in place, continuous suction -Will follow recommendations and care as dictated by general surgery. -Continue treatment for COPD exacerbation as mentioned below. -Patient encourage to quit smoking -Continue oxygen supplementation and wean off as tolerated.  2-COPD with acute exacerbation ([place]) -Continue steroids, doxycycline, DuoNeb, Pulmicort and Brovana -Incentive spirometer and flutter valve has been ordered -Covid test negative. -Continue to wean oxygen supplementation to room air as tolerated.  3-Hypertension -Stable overall. -Will continue amlodipine. -Heart healthy diet has been ordered.  4-idiopathic neuropathy/chronic pain -Continue Neurontin 3 times daily. -continue PRN analgesics  5-Polycythemia -In the setting of chronic tobacco abuse -Continue to monitor blood count.  6-GI prophylaxis/GERD -Continue Pepcid.  7-tobacco abuse -Cessation counseling has been provided -Continue nicotine patch.  DVT prophylaxis:  Code Status: Full code Family Communication: No family at bedside Disposition Plan: Remains inpatient, follow recommendation by general surgery for further care of the chest tube and removal time.  Continue treatment for COPD exacerbation.  Consultants:   General surgery  Procedures:   See below for x-ray reports.  Antimicrobials:  Anti-infectives (From admission, onward)   Start     Dose/Rate Route Frequency Ordered Stop   08/19/19 0200  azithromycin (ZITHROMAX) 500 mg in sodium chloride 0.9 % 250 mL IVPB     500 mg 250 mL/hr over 60 Minutes Intravenous Every 24 hours 08/19/19 0120        Subjective: No fever, still short of breath and experiencing tachypnea with minimal exertion.  Left-sided chest pain and chest tube in place.  Requiring  2 L nasal cannula supplementation.  Diffuse wheezing  appreciated on exam.  Objective: Vitals:   08/19/19 2000 08/19/19 2252 08/20/19 0614 08/20/19 0833  BP: 132/69 128/86 132/72   Pulse: 71 72 (!) 53   Resp: (!) 27 20 18    Temp:  97.7 F (36.5 C) 97.7 F (36.5 C)   TempSrc:  Oral Oral   SpO2: 93% 94% 95% 96%  Weight:      Height:        Intake/Output Summary (Last 24 hours) at 08/20/2019 1436 Last data filed at 08/19/2019 1800 Gross per 24 hour  Intake 336.79 ml  Output --  Net 336.79 ml   Filed Weights   08/18/19 2208 08/19/19 0640  Weight: 75.8 kg 75.3 kg    Examination: General exam: Alert, awake, oriented x 3, feeling slightly better.  Still complaining of shortness of breath, requiring oxygen supplementation and significant left-sided chest discomfort.  Chest tube in place. Respiratory system: Decreased breath sounds at the bases, positive expiratory wheezing, positive rhonchi bilaterally.  No using accessory muscles.  Intermittent tachypnea with exertion.  2 L nasal cannula supplementation in place. Cardiovascular system:RRR. No murmurs, rubs, gallops. Gastrointestinal system: Abdomen is nondistended, soft and nontender. No organomegaly or masses felt. Normal bowel sounds heard. Central nervous system: Alert and oriented. No focal neurological deficits. Extremities: No cyanosis, no clubbing. Skin: No rashes, no petechiae.  Left-sided chest tube in place, continue suction 20 mm Hg Psychiatry: Judgement and insight appear normal. Mood & affect appropriate.    Data Reviewed: I have personally reviewed following labs and imaging studies  CBC: Recent Labs  Lab 08/18/19 2210 08/19/19 0343  WBC 9.1 12.3*  NEUTROABS 3.8 11.3*  HGB 20.9* 19.4*  HCT 60.9* 56.7*  MCV 97.3 99.8  PLT 279 [id number]   Basic Metabolic Panel: Recent Labs  Lab 08/18/19 2210 08/19/19 0343  NA 139 137  K 3.4* 3.9  CL 99 100  CO2 22 23  GLUCOSE 124* 210*  BUN 7* 9  CREATININE 0.71 0.70  CALCIUM 10.2 9.3  MG  --  2.1   GFR: Estimated  Creatinine Clearance: 97 mL/min (by C-G formula based on SCr of 0.7 mg/dL).   Liver Function Tests: Recent Labs  Lab 08/18/19 2210  AST 38  ALT 34  ALKPHOS 96  BILITOT 0.9  PROT 8.8*  ALBUMIN 4.5   CBG: Recent Labs  Lab 08/19/19 0813 08/20/19 0729  GLUCAP 182* 145*    Radiology Studies: Dg Chest 1v Repeat Same Day  Result Date: 08/18/2019 CLINICAL DATA:  Chest tube placement EXAM: CHEST - 1 VIEW SAME DAY COMPARISON:  Radiograph 08/18/2019 FINDINGS: Interval placement of a left-sided pigtail chest tube with re-expansion of the left lung. Small residual apical pneumothorax is present. Some residual hazy opacity in the bases may reflect atelectatic lung or re-expansion edema in the left lung. There are biapical areas of scarring and basilar airways thickening. IMPRESSION: Re-expansion of the left lung post placement of a pigtail pleural drain. Small residual left apical pneumothorax. Improved mediastinal shift. Extensive left chest wall subcutaneous emphysema. Electronically Signed   By: Lovena Le M.D.   On: 08/18/2019 22:53   Dg Chest Port 1 View  Result Date: 08/20/2019 CLINICAL DATA:  Follow up pneumothorax, left sided chest pain, sob. Hx of COPD, emphysema of lung, HTN, and current smoker EXAM: PORTABLE CHEST 1 VIEW COMPARISON:  Radiograph 08/18/2019 FINDINGS: Small LEFT apical pneumothorax with pleural edge measuring 8 mm from the apical chest wall. No  change in volume. LEFT chest tube repositioned such that the pigtail is at the lateral chest wall. Small volume subcutaneous emphysema noted on the LEFT. Increase in subcutaneous emphysema in the LEFT neck. Mild increase in atelectasis or fluid along the LEFT horizontal fissure. Mild increase in atelectasis the RIGHT lung base additionally. IMPRESSION: 1. LEFT chest tube has been repositioned such that the pigtail is now at the lateral chest wall. 2. Stable small LEFT apical pneumothorax. 3. Interval increase in subcutaneous in the  LEFT neck. 4. Interval increase in bibasilar atelectasis. Electronically Signed   By: Suzy Bouchard M.D.   On: 08/20/2019 07:05   Dg Chest Portable 1 View  Result Date: 08/18/2019 CLINICAL DATA:  Shortness of breath, decreased breath sounds on the left. EXAM: PORTABLE CHEST 1 VIEW COMPARISON:  None. FINDINGS: Cardiac shadow is within normal limits. Aortic calcifications are seen. Large left-sided pneumothorax is noted of at least 60% with mild mediastinal shift to the right. No focal infiltrate is seen. Some scarring is noted in the apices bilateral likely related to emphysematous changes and bullous disease. IMPRESSION: Large left-sided pneumothorax with tension component and shift to the right. Critical Value/emergent results were called by telephone at the time of interpretation on 08/18/2019 at 10:22 pm to Dr. Ezequiel Essex , who verbally acknowledged these results. Electronically Signed   By: Inez Catalina M.D.   On: 08/18/2019 22:23    Scheduled Meds:  amLODipine  5 mg Oral Daily   arformoterol  15 mcg Nebulization BID   budesonide (PULMICORT) nebulizer solution  0.5 mg Nebulization BID   Chlorhexidine Gluconate Cloth  6 each Topical Daily   doxycycline  100 mg Oral Q12H   famotidine  20 mg Oral Daily   gabapentin  600 mg Oral TID   ipratropium-albuterol  3 mL Nebulization QID   ketorolac  15 mg Intravenous Q6H   methylPREDNISolone (SOLU-MEDROL) injection  60 mg Intravenous Q8H   nicotine  14 mg Transdermal Daily   sodium chloride flush  3 mL Intravenous Q12H   Continuous Infusions:  azithromycin 500 mg (08/20/19 0041)     LOS: 2 days    Time spent: 30 minutes.    Barton Dubois, MD [facility] Pager [phone]   08/20/2019, 2:36 PM

## 2019-08-20 NOTE — Consult Note (Signed)
[facility] Consult  Reason for Consult: Chest tube management/ Spontaneous Pneumothorax  Referring Physician:  Dr. Dyann Kief Dr. Wyvonnia Dusky   Chief Complaint    Shortness of Breath      HPI: Glen Cobb is a 63 y.o. male with a history of spontaneous pneumothorax that presented to the ED 2 days ago with left sided CP and SOB. He had been coughing and had increased sputum without fevers or chills. He was watching tv and developed the acute onset of this pain and SOB. This prompted the ED visit, and he was found to have the pneumothorax.  He had a similar issue 40 years prior per his report.  He was tachycardic in the ED and had lower saturations, and had a pigtail catheter placed by Dr. Wyvonnia Dusky with resolution of the pneumothorax that also was shifting his mediastinum.  He had a small residual pneumothorax and subcutaneous emphysema on the repeat CXR.    He has been on suction at 20 mm Hg. He reports tenderness in the left shoulder and under his armpit. He says his SOB has resolved.   Past Medical History:  Diagnosis Date  . Arthritis   . Cancer ([place])    bladder cancer x 2  . Cataract    MD just watching  . COPD (chronic obstructive pulmonary disease) ([place])   . Emphysema of lung ([place])   . FH: migraines    last one 1 yr ago - OTC med prn  . Gout   . HSV infection   . Hyperlipidemia    no med. diet  . Hypertension   . Kidney stone    passed stone - no surgery  . Osteoporosis   . Sleep apnea    Does not use CPAP nightly.    Past Surgical History:  Procedure Laterality Date  . arm surgery Right    with metal plate  . BLADDER SURGERY     x 2 for bladder cancer   . FOOT SURGERY Right   . SURGERY SCROTAL / TESTICULAR     cyst removed from testicular    Family History  Adopted: Yes    Social History   Tobacco Use  . Smoking status: Current Every Day Smoker    Packs/day: 0.25    Years: 46.00    Pack years: 11.50    Types: Cigarettes  . Smokeless  tobacco: Former Systems developer    Types: Snuff  . Tobacco comment: using a nicotine patch, trying to quit   Substance Use Topics  . Alcohol use: Yes    Alcohol/week: 4.0 - 6.0 standard drinks    Types: 4 - 6 Standard drinks or equivalent per week    Comment: Rum   . Drug use: Yes    Types: Marijuana    Comment: Last use 03/2019    Medications:  I have reviewed the patient's current medications. Prior to Admission:  Medications Prior to Admission  Medication Sig Dispense Refill Last Dose  . ACYCLOVIR PO Take by mouth daily as needed.     Marland Kitchen albuterol-ipratropium (COMBIVENT) 18-103 MCG/ACT inhaler Inhale 2 puffs into the lungs every 6 (six) hours as needed.     Marland Kitchen allopurinol (ZYLOPRIM) 100 MG tablet Take 100 mg by mouth daily.     Marland Kitchen amLODipine (NORVASC) 5 MG tablet Take 5 mg by mouth daily.     . budesonide-formoterol (SYMBICORT) 160-4.5 MCG/ACT inhaler Inhale 2 puffs into the lungs 2 (two) times daily.     . Cholecalciferol (VITAMIN  D3) 50 MCG (2000 UT) capsule Take by mouth.     . gabapentin (NEURONTIN) 600 MG tablet Take 600 mg by mouth 3 (three) times daily.     Marland Kitchen HYDROcodone-acetaminophen (NORCO/VICODIN) 5-325 MG per tablet 1 or 2 po q4h prn pain. Please take with food (Patient not taking: Reported on 10/28/2018) 20 tablet 0   . ibuprofen (ADVIL,MOTRIN) 200 MG tablet Take by mouth.      Scheduled: . amLODipine  5 mg Oral Daily  . arformoterol  15 mcg Nebulization BID  . budesonide (PULMICORT) nebulizer solution  0.5 mg Nebulization BID  . Chlorhexidine Gluconate Cloth  6 each Topical Daily  . doxycycline  100 mg Oral Q12H  . famotidine  20 mg Oral Daily  . gabapentin  600 mg Oral TID  . ipratropium-albuterol  3 mL Nebulization QID  . ketorolac  15 mg Intravenous Q6H  . methylPREDNISolone (SOLU-MEDROL) injection  60 mg Intravenous Q8H  . nicotine  14 mg Transdermal Daily  . sodium chloride flush  3 mL Intravenous Q12H   Continuous:  [id number], diphenhydrAMINE,  guaiFENesin-dextromethorphan, HYDROcodone-acetaminophen, HYDROmorphone (DILAUDID) injection, ondansetron (ZOFRAN) IV  Allergies  Allergen Reactions  . Codeine Itching  . Hydrocodone-Acetaminophen Itching  . Other Hives    Strawberries causes hives     ROS:  A comprehensive review of systems was negative except for: Respiratory: positive for cough, emphysema, pleurisy/chest pain, sputum and tenderness of the left chest area/ subcutaneous air Musculoskeletal: positive for left shoulder pain  Blood pressure 132/72, pulse (!) 53, temperature 97.7 F (36.5 C), temperature source Oral, resp. rate 18, height 5\' 9"  (1.753 m), weight 75.3 kg, SpO2 96 %. Physical Exam Vitals signs reviewed.  Constitutional:      Appearance: He is well-developed.  HENT:     Head: Normocephalic and atraumatic.  Eyes:     Extraocular Movements: Extraocular movements intact.     Pupils: Pupils are equal, round, and reactive to light.  Cardiovascular:     Rate and Rhythm: Normal rate and regular rhythm.  Pulmonary:     Effort: Pulmonary effort is normal.     Breath sounds: Normal breath sounds.     Comments: Left pigtail in place under axilla, sutures in place, no airholes outside of the chest wall, some minor subcutaneous emphysema felt; no airleak on suction  Abdominal:     Palpations: Abdomen is soft.     Tenderness: There is no abdominal tenderness.  Musculoskeletal: Normal range of motion.  Skin:    General: Skin is warm and dry.  Neurological:     General: No focal deficit present.     Mental Status: He is alert and oriented to person, place, and time.  Psychiatric:        Mood and Affect: Mood normal.        Behavior: Behavior normal.     Results: Results for orders placed or performed during the hospital encounter of 08/18/19 (from the past 48 hour(s))  CBC with Differential     Status: Abnormal   Collection Time: 08/18/19 10:10 PM  Result Value Ref Range   WBC 9.1 4.0 - 10.5 K/uL   RBC  6.26 (H) 4.22 - 5.81 MIL/uL   Hemoglobin 20.9 (H) 13.0 - 17.0 g/dL   HCT 60.9 (H) 39.0 - 52.0 %   MCV 97.3 80.0 - 100.0 fL   MCH 33.4 26.0 - 34.0 pg   MCHC 34.3 30.0 - 36.0 g/dL   RDW 13.2 11.5 - 15.5 %  Platelets 279 150 - 400 K/uL   nRBC 0.0 0.0 - 0.2 %   Neutrophils Relative % 41 %   Neutro Abs 3.8 1.7 - 7.7 K/uL   Lymphocytes Relative 47 %   Lymphs Abs 4.2 (H) 0.7 - 4.0 K/uL   Monocytes Relative 9 %   Monocytes Absolute 0.8 0.1 - 1.0 K/uL   Eosinophils Relative 2 %   Eosinophils Absolute 0.2 0.0 - 0.5 K/uL   Basophils Relative 1 %   Basophils Absolute 0.1 0.0 - 0.1 K/uL   Immature Granulocytes 0 %   Abs Immature Granulocytes 0.02 0.00 - 0.07 K/uL    Comment: Performed at [facility], [address] [place], [place] [address]  Comprehensive metabolic panel     Status: Abnormal   Collection Time: 08/18/19 10:10 PM  Result Value Ref Range   Sodium 139 135 - 145 mmol/L   Potassium 3.4 (L) 3.5 - 5.1 mmol/L   Chloride 99 98 - 111 mmol/L   CO2 22 22 - 32 mmol/L   Glucose, Bld 124 (H) 70 - 99 mg/dL   BUN 7 (L) 8 - 23 mg/dL   Creatinine, Ser 0.71 0.61 - 1.24 mg/dL   Calcium 10.2 8.9 - 10.3 mg/dL   Total Protein 8.8 (H) 6.5 - 8.1 g/dL   Albumin 4.5 3.5 - 5.0 g/dL   AST 38 15 - 41 U/L   ALT 34 0 - 44 U/L   Alkaline Phosphatase 96 38 - 126 U/L   Total Bilirubin 0.9 0.3 - 1.2 mg/dL   GFR calc non Af Amer >60 >60 mL/min   GFR calc Af Amer >60 >60 mL/min   Anion gap 18 (H) 5 - 15    Comment: Performed at [facility], [address], [place], [place] [address]  Brain natriuretic peptide     Status: None   Collection Time: 08/18/19 10:10 PM  Result Value Ref Range   B Natriuretic Peptide 32.0 0.0 - 100.0 pg/mL    Comment: Performed at [facility], [address] [place], [place] [address]  Troponin I (High Sensitivity)     Status: None   Collection Time: 08/18/19 10:10 PM  Result Value Ref Range   Troponin I (High Sensitivity) 3 <18 ng/L    Comment: (NOTE) Elevated high  sensitivity troponin I (hsTnI) values and significant  changes across serial measurements may suggest ACS but many other  chronic and acute conditions are known to elevate hsTnI results.  Refer to the "Links" section for chest pain algorithms and additional  guidance. Performed at [facility], [address], [place], [place] [address]   D-dimer, quantitative (not at [facility])     Status: None   Collection Time: 08/18/19 10:10 PM  Result Value Ref Range   D-Dimer, Quant 0.36 0.00 - 0.50 ug/mL-FEU    Comment: (NOTE) At the manufacturer cut-off of 0.50 ug/mL FEU, this assay has been documented to exclude PE with a sensitivity and negative predictive value of 97 to 99%.  At this time, this assay has not been approved by the FDA to exclude DVT/VTE. Results should be correlated with clinical presentation. Performed at [facility], [address] [place], [place] [address]   Erythropoietin     Status: Abnormal   Collection Time: 08/18/19 10:10 PM  Result Value Ref Range   Erythropoietin 27.1 (H) 2.6 - 18.5 mIU/mL    Comment: (NOTE) [employer] UniCel DxI 800 Immunoassay System Values obtained with different assay methods or kits cannot  be used interchangeably. Results cannot be interpreted as absolute evidence of the presence or absence of malignant disease. Performed At: [facility] [place], [place] [id number] Rush Farmer MD [id number]   SARS CORONAVIRUS 2 (TAT 6-24 HRS) Nasopharyngeal Nasopharyngeal Swab     Status: None   Collection Time: 08/18/19 10:11 PM   Specimen: Nasopharyngeal Swab  Result Value Ref Range   SARS Coronavirus 2 NEGATIVE NEGATIVE    Comment: (NOTE) SARS-CoV-2 target nucleic acids are NOT DETECTED. The SARS-CoV-2 RNA is generally detectable in upper and lower respiratory specimens during the acute phase of infection. Negative results do not preclude SARS-CoV-2 infection, do not rule out co-infections with other pathogens, and  should not be used as the sole basis for treatment or other patient management decisions. Negative results must be combined with clinical observations, patient history, and epidemiological information. The expected result is Negative. Fact Sheet for Patients: SugarRoll.be Fact Sheet for Healthcare Providers: https://www.woods-mathews.com/ This test is not yet approved or cleared by the [place] FDA and  has been authorized for detection and/or diagnosis of SARS-CoV-2 by FDA under an Emergency Use Authorization (EUA). This EUA will remain  in effect (meaning this test can be used) for the duration of the COVID-19 declaration under Section 56 4(b)(1) of the Act, 21 U.S.C. section 360bbb-3(b)(1), unless the authorization is terminated or revoked sooner. Performed at [facility], [place] [address], [place], [place] [address]   Troponin I (High Sensitivity)     Status: None   Collection Time: 08/18/19 11:51 PM  Result Value Ref Range   Troponin I (High Sensitivity) 5 <18 ng/L    Comment: (NOTE) Elevated high sensitivity troponin I (hsTnI) values and significant  changes across serial measurements may suggest ACS but many other  chronic and acute conditions are known to elevate hsTnI results.  Refer to the "Links" section for chest pain algorithms and additional  guidance. Performed at [facility], [address], [place], [place] [address]   Carboxyhemoglobin (single result)     Status: Abnormal   Collection Time: 08/19/19  3:43 AM  Result Value Ref Range   Carboxyhemoglobin 2.2 (H) 0.5 - 1.5 %    Comment: Performed at [facility], [address], [place], [place] [address]  HIV Antibody (routine testing w rflx)     Status: None   Collection Time: 08/19/19  3:43 AM  Result Value Ref Range   HIV Screen 4th Generation wRfx NON REACTIVE NON REACTIVE    Comment: Performed at [facility] [address] [place], [place] [id number]   Basic metabolic panel     Status: Abnormal   Collection Time: 08/19/19  3:43 AM  Result Value Ref Range   Sodium 137 135 - 145 mmol/L   Potassium 3.9 3.5 - 5.1 mmol/L   Chloride 100 98 - 111 mmol/L   CO2 23 22 - 32 mmol/L   Glucose, Bld 210 (H) 70 - 99 mg/dL   BUN 9 8 - 23 mg/dL   Creatinine, Ser 0.70 0.61 - 1.24 mg/dL   Calcium 9.3 8.9 - 10.3 mg/dL   GFR calc non Af Amer >60 >60 mL/min   GFR calc Af Amer >60 >60 mL/min   Anion gap 14 5 - 15    Comment: Performed at [facility], [address], [place],  [address]  Magnesium     Status: None   Collection Time: 08/19/19  3:43 AM  Result Value Ref Range  Magnesium 2.1 1.7 - 2.4 mg/dL    Comment: Performed at [facility], [address] [place], [place] [address]  CBC WITH DIFFERENTIAL     Status: Abnormal   Collection Time: 08/19/19  3:43 AM  Result Value Ref Range   WBC 12.3 (H) 4.0 - 10.5 K/uL   RBC 5.68 4.22 - 5.81 MIL/uL   Hemoglobin 19.4 (H) 13.0 - 17.0 g/dL   HCT 56.7 (H) 39.0 - 52.0 %   MCV 99.8 80.0 - 100.0 fL   MCH 34.2 (H) 26.0 - 34.0 pg   MCHC 34.2 30.0 - 36.0 g/dL   RDW 13.5 11.5 - 15.5 %   Platelets 230 150 - 400 K/uL   nRBC 0.0 0.0 - 0.2 %   Neutrophils Relative % 93 %   Neutro Abs 11.3 (H) 1.7 - 7.7 K/uL   Lymphocytes Relative 6 %   Lymphs Abs 0.8 0.7 - 4.0 K/uL   Monocytes Relative 1 %   Monocytes Absolute 0.1 0.1 - 1.0 K/uL   Eosinophils Relative 0 %   Eosinophils Absolute 0.0 0.0 - 0.5 K/uL   Basophils Relative 0 %   Basophils Absolute 0.0 0.0 - 0.1 K/uL   Immature Granulocytes 0 %   Abs Immature Granulocytes 0.04 0.00 - 0.07 K/uL    Comment: Performed at [facility], [address] [place], [place] [address]  MRSA PCR Screening     Status: None   Collection Time: 08/19/19  6:35 AM   Specimen: Nasopharyngeal  Result Value Ref Range   MRSA by PCR NEGATIVE NEGATIVE    Comment:        The GeneXpert MRSA Assay (FDA approved for NASAL specimens only), is one component of a comprehensive  MRSA colonization surveillance program. It is not intended to diagnose MRSA infection nor to guide or monitor treatment for MRSA infections. Performed at [facility], [address], [place], [place] [address]   Glucose, capillary     Status: Abnormal   Collection Time: 08/19/19  8:13 AM  Result Value Ref Range   Glucose-Capillary 182 (H) 70 - 99 mg/dL  Glucose, capillary     Status: Abnormal   Collection Time: 08/20/19  7:29 AM  Result Value Ref Range   Glucose-Capillary 145 (H) 70 - 99 mg/dL   Reviewed CXR from previous day and today Pigtail looks like it has pulled out some, small apical pneumothorax / subcutaneous air in the left neck Dg Chest 1v Repeat Same Day  Result Date: 08/18/2019 CLINICAL DATA:  Chest tube placement EXAM: CHEST - 1 VIEW SAME DAY COMPARISON:  Radiograph 08/18/2019 FINDINGS: Interval placement of a left-sided pigtail chest tube with re-expansion of the left lung. Small residual apical pneumothorax is present. Some residual hazy opacity in the bases may reflect atelectatic lung or re-expansion edema in the left lung. There are biapical areas of scarring and basilar airways thickening. IMPRESSION: Re-expansion of the left lung post placement of a pigtail pleural drain. Small residual left apical pneumothorax. Improved mediastinal shift. Extensive left chest wall subcutaneous emphysema. Electronically Signed   By: Lovena Le M.D.   On: 08/18/2019 22:53   Dg Chest Port 1 View  Result Date: 08/20/2019 CLINICAL DATA:  Follow up pneumothorax, left sided chest pain, sob. Hx of COPD, emphysema of lung, HTN, and current smoker EXAM: PORTABLE CHEST 1 VIEW COMPARISON:  Radiograph 08/18/2019 FINDINGS: Small LEFT apical pneumothorax with pleural edge measuring 8 mm from the apical chest wall. No change in volume. LEFT chest  tube repositioned such that the pigtail is at the lateral chest wall. Small volume subcutaneous emphysema noted on the LEFT. Increase in subcutaneous  emphysema in the LEFT neck. Mild increase in atelectasis or fluid along the LEFT horizontal fissure. Mild increase in atelectasis the RIGHT lung base additionally. IMPRESSION: 1. LEFT chest tube has been repositioned such that the pigtail is now at the lateral chest wall. 2. Stable small LEFT apical pneumothorax. 3. Interval increase in subcutaneous in the LEFT neck. 4. Interval increase in bibasilar atelectasis. Electronically Signed   By: Suzy Bouchard M.D.   On: 08/20/2019 07:05   Dg Chest Portable 1 View  Result Date: 08/18/2019 CLINICAL DATA:  Shortness of breath, decreased breath sounds on the left. EXAM: PORTABLE CHEST 1 VIEW COMPARISON:  None. FINDINGS: Cardiac shadow is within normal limits. Aortic calcifications are seen. Large left-sided pneumothorax is noted of at least 60% with mild mediastinal shift to the right. No focal infiltrate is seen. Some scarring is noted in the apices bilateral likely related to emphysematous changes and bullous disease. IMPRESSION: Large left-sided pneumothorax with tension component and shift to the right. Critical Value/emergent results were called by telephone at the time of interpretation on 08/18/2019 at 10:22 pm to Dr. Ezequiel Essex , who verbally acknowledged these results. Electronically Signed   By: Inez Catalina M.D.   On: 08/18/2019 22:23     Assessment & Plan:  Gurjot Peardon is a 63 y.o. male with a spontaneous pneumothorax that is stable. He has no air leak and is on 20 mm Hg. CXR is stable. I looked at the pigtail to ensure it had not come out, and is sutured in place without air holes outside of the body cavity that I can tell.  He has breath sounds equal bilaterally.    -Continue suction 20 mm Hg now, and will repeat CXR in the AM -If has acute desaturation or pain, will need stat CXR  -Once CXR is improving/ stable for another day can see how he does on waterseal  -Will see tomorrow   All questions were answered to the satisfaction of  the patient.   Virl Cagey 08/20/2019, 3:54 PM

## 2019-08-20 NOTE — Progress Notes (Signed)
Patient CXR with continued small apical PTX and more subcutaneous emphysema.  Needs to be on continuous suction 20 mm Hg.  Updated Dr. Dyann Kief.   Curlene Labrum, MD

## 2019-08-21 ENCOUNTER — Inpatient Hospital Stay (HOSPITAL_COMMUNITY): Payer: No Typology Code available for payment source

## 2019-08-21 MED ORDER — HYDROMORPHONE HCL 1 MG/ML IJ SOLN
1.5000 mg | INTRAMUSCULAR | Status: DC | PRN
  Administered 2019-08-21 – 2019-08-22 (×6): 1.5 mg via INTRAVENOUS
  Filled 2019-08-21 (×6): qty 1.5

## 2019-08-21 MED ORDER — IPRATROPIUM-ALBUTEROL 0.5-2.5 (3) MG/3ML IN SOLN
3.0000 mL | Freq: Four times a day (QID) | RESPIRATORY_TRACT | Status: DC
  Administered 2019-08-21 – 2019-08-22 (×5): 3 mL via RESPIRATORY_TRACT
  Filled 2019-08-21 (×5): qty 3

## 2019-08-21 NOTE — Plan of Care (Signed)
  Problem: Education: Goal: Knowledge of General Education information will improve Description Including pain rating scale, medication(s)/side effects and non-pharmacologic comfort measures Outcome: Progressing   Problem: Health Behavior/Discharge Planning: Goal: Ability to manage health-related needs will improve Outcome: Progressing   

## 2019-08-21 NOTE — Progress Notes (Signed)
PROGRESS NOTE    Glen Cobb  [id number] DOB: 1956-07-21 DOA: 08/18/2019 PCP: [name], MD     Brief Narrative:  As per H&P by Dr. Myna Hidalgo on 08/18/2019 63 y.o. male with medical history significant for COPD, spontaneous right-sided pneumothorax roughly 40 years ago, spinal stenosis, and tobacco abuse in the process of quitting, now presenting with acute onset of left-sided chest pain and shortness of breath.  The patient reports that for the past several days he has had increased cough and increased sputum production without any fevers, chills, or chest pain.  He was at rest this evening, watching TV, when he developed acute onset of severe left-sided chest pain and shortness of breath.  He had not been coughing at the time and denies any recent trauma or procedure.  He describes similar experience roughly 40 years ago when he had a spontaneous right-sided pneumothorax.  ED Course: Upon arrival to the ED, patient is found to be saturating upper 80s on room air, tachypneic in the mid 20s, tachycardic to 120, and with stable blood pressure.  EKG features sinus tachycardia with rate 122 and nonspecific IVCD.  Chest x-ray is concerning for large left-sided pneumothorax with tension component and shift to the right.  Chemistry panel features a mild hypokalemia.  CBC is notable for hemoglobin of 20.9 and hematocrit 60.9.  High-sensitivity troponins and BNP are normal.  Pigtail catheter was inserted into the left pleural space and repeat chest x-ray demonstrates reexpansion of the left lung with small residual left apical pneumothorax and improved mediastinal shift.  Patient was treated with albuterol, Solu-Medrol, Dilaudid, and morphine in the emergency department.  Surgery was consulted by the ED physician and COVID-19 screening test is still pending.  Assessment & Plan: 1-spontaneous tension pneumothorax -Patient with prior history of a spontaneous pneumothorax, approximately 40 years ago.  -Most likely from blood pressure of underlying bullae in the setting of COPD exacerbation, coughing spells and ongoing tobacco abuse. -Chest tube in place, continuous suction -Will follow recommendations and care as dictated by general surgery. -Continue treatment for COPD exacerbation as mentioned below. -Patient encourage to quit smoking -Continue oxygen supplementation and wean off as tolerated. -Repeat chest x-ray in the morning.  2-COPD with acute exacerbation ([place]) -Continue steroids, doxycycline, DuoNeb, Pulmicort and Brovana -Incentive spirometer and flutter valve has been ordered -Covid test negative. -Continue to wean oxygen supplementation to room air as tolerated.  3-Hypertension -Stable overall. -Will continue amlodipine. -Heart healthy diet has been ordered.  4-idiopathic neuropathy/chronic pain -Continue Neurontin 3 times daily. -continue PRN analgesics  5-Polycythemia -In the setting of chronic tobacco abuse -Continue to monitor blood count.  6-GI prophylaxis/GERD -Continue Pepcid.  7-tobacco abuse -Cessation counseling has been provided -Continue nicotine patch.  DVT prophylaxis:  Code Status: Full code Family Communication: No family at bedside Disposition Plan: Remains inpatient, follow recommendation by general surgery for further care of the chest tube and removal time.  Continue treatment for COPD exacerbation.  Consultants:   General surgery  Procedures:   See below for x-ray reports.  Antimicrobials:  Anti-infectives (From admission, onward)   Start     Dose/Rate Route Frequency Ordered Stop   08/20/19 1600  doxycycline (VIBRA-TABS) tablet 100 mg     100 mg Oral Every 12 hours 08/20/19 1445 08/25/19 2159   08/19/19 0200  azithromycin (ZITHROMAX) 500 mg in sodium chloride 0.9 % 250 mL IVPB  Status:  Discontinued     500 mg 250 mL/hr over 60 Minutes Intravenous Every 24  hours 08/19/19 0120 08/20/19 1445      Subjective: No fever, no  nausea, no vomiting.  Still complaining of some shortness of breath on exertion, requiring oxygen supplementation and having left-sided chest discomfort.  Chest tube in place.  Objective: Vitals:   08/21/19 0600 08/21/19 0809 08/21/19 0816 08/21/19 0826  BP: 135/81     Pulse: (!) 56     Resp: 18     Temp: (!) 97.5 F (36.4 C)     TempSrc: Oral     SpO2: 98% 94% 96% 100%  Weight:      Height:       No intake or output data in the 24 hours ending 08/21/19 1256 Filed Weights   08/18/19 2208 08/19/19 0640  Weight: 75.8 kg 75.3 kg    Examination: General exam: Alert, awake, oriented x 3, no fever, no nausea, no vomiting.  Reports breathing is a stable.  Still complaining of chest discomfort on his left side and is requiring oxygen supplementation (1.5 L currently).  Still having intermittent dry cough. Respiratory system: Positive rhonchi at, decreased breath sounds at the bases, otherwise fair air movement bilaterally.  No using accessory muscles.  Shallow breath appreciated secondary to chest discomfort with deep inspiration.  Chest tube in place.  1.5 L nasal cannula in place.  No wheezing at this time.   Cardiovascular system:RRR. No murmurs, rubs, gallops. Gastrointestinal system: Abdomen is nondistended, soft and nontender. No organomegaly or masses felt. Normal bowel sounds heard. Central nervous system: Alert and oriented. No focal neurological deficits. Extremities: No C/C/E, +pedal pulses Skin: No rashes, lesions or ulcers Psychiatry: Judgement and insight appear normal. Mood & affect appropriate.    Data Reviewed: I have personally reviewed following labs and imaging studies  CBC: Recent Labs  Lab 08/18/19 2210 08/19/19 0343  WBC 9.1 12.3*  NEUTROABS 3.8 11.3*  HGB 20.9* 19.4*  HCT 60.9* 56.7*  MCV 97.3 99.8  PLT 279 [id number]   Basic Metabolic Panel: Recent Labs  Lab 08/18/19 2210 08/19/19 0343  NA 139 137  K 3.4* 3.9  CL 99 100  CO2 22 23  GLUCOSE 124* 210*   BUN 7* 9  CREATININE 0.71 0.70  CALCIUM 10.2 9.3  MG  --  2.1   GFR: Estimated Creatinine Clearance: 97 mL/min (by C-G formula based on SCr of 0.7 mg/dL).   Liver Function Tests: Recent Labs  Lab 08/18/19 2210  AST 38  ALT 34  ALKPHOS 96  BILITOT 0.9  PROT 8.8*  ALBUMIN 4.5   CBG: Recent Labs  Lab 08/19/19 0813 08/20/19 0729  GLUCAP 182* 145*    Radiology Studies: Dg Chest Port 1 View  Result Date: 08/21/2019 CLINICAL DATA:  Recent pneumothorax EXAM: PORTABLE CHEST 1 VIEW COMPARISON:  August 20, 2019 FINDINGS: Chest tube remains on the left laterally. There is a rather minimal apical pneumothorax on the left without tension component. Subcutaneous air is again noted. There is atelectatic change in the lower lung regions. There may be mild associated infiltrate within these areas of atelectasis. No new opacity is evident. Heart size and pulmonary vascular normal. There is aortic atherosclerosis. No bone lesions. IMPRESSION: Persistent chest tube on the left with slight left apical pneumothorax. No tension component. Subcutaneous air again noted. Lower lung region atelectatic change persists. A degree of superimposed pneumonia in these areas, particular on the left, cannot be excluded. No new opacity evident. Stable cardiac silhouette. Aortic Atherosclerosis (ICD10-I70.0). Electronically Signed   By: Lowella Grip  III M.D.   On: 08/21/2019 07:01   Dg Chest Port 1 View  Result Date: 08/20/2019 CLINICAL DATA:  Follow up pneumothorax, left sided chest pain, sob. Hx of COPD, emphysema of lung, HTN, and current smoker EXAM: PORTABLE CHEST 1 VIEW COMPARISON:  Radiograph 08/18/2019 FINDINGS: Small LEFT apical pneumothorax with pleural edge measuring 8 mm from the apical chest wall. No change in volume. LEFT chest tube repositioned such that the pigtail is at the lateral chest wall. Small volume subcutaneous emphysema noted on the LEFT. Increase in subcutaneous emphysema in the LEFT  neck. Mild increase in atelectasis or fluid along the LEFT horizontal fissure. Mild increase in atelectasis the RIGHT lung base additionally. IMPRESSION: 1. LEFT chest tube has been repositioned such that the pigtail is now at the lateral chest wall. 2. Stable small LEFT apical pneumothorax. 3. Interval increase in subcutaneous in the LEFT neck. 4. Interval increase in bibasilar atelectasis. Electronically Signed   By: Suzy Bouchard M.D.   On: 08/20/2019 07:05    Scheduled Meds: . amLODipine  5 mg Oral Daily  . arformoterol  15 mcg Nebulization BID  . budesonide (PULMICORT) nebulizer solution  0.5 mg Nebulization BID  . Chlorhexidine Gluconate Cloth  6 each Topical Daily  . doxycycline  100 mg Oral Q12H  . famotidine  20 mg Oral Daily  . gabapentin  600 mg Oral TID  . ipratropium-albuterol  3 mL Nebulization QID  . ketorolac  15 mg Intravenous Q6H  . methylPREDNISolone (SOLU-MEDROL) injection  60 mg Intravenous Q8H  . nicotine  14 mg Transdermal Daily  . sodium chloride flush  3 mL Intravenous Q12H   Continuous Infusions:    LOS: 3 days    Time spent: 30 minutes.    Barton Dubois, MD [facility] Pager [phone]   08/21/2019, 12:56 PM

## 2019-08-21 NOTE — Progress Notes (Signed)
[facility] Progress Note     Subjective: Doing fair. Still with some sharp pain around the chest tube and on the left side. Not particularly associated with deep breathing.   Objective: Vital signs in last 24 hours: Temp:  [97.5 F (36.4 C)-98.3 F (36.8 C)] 97.5 F (36.4 C) (11/27 0600) Pulse Rate:  [56-75] 56 (11/27 0600) Resp:  [18] 18 (11/27 0600) BP: (125-138)/(80-82) 135/81 (11/27 0600) SpO2:  [91 %-100 %] 100 % (11/27 0826)    Intake/Output from previous day: No intake/output data recorded. Intake/Output this shift: No intake/output data recorded.  General appearance: alert, cooperative and no distress Resp: clear to auscultation bilaterally and chest tube in place, no palpable subcutaneous emphysema noted, no airleak, changed to waterseal  Lab Results:  Recent Labs    08/18/19 2210 08/19/19 0343  WBC 9.1 12.3*  HGB 20.9* 19.4*  HCT 60.9* 56.7*  PLT 279 230   BMET Recent Labs    08/18/19 2210 08/19/19 0343  NA 139 137  K 3.4* 3.9  CL 99 100  CO2 22 23  GLUCOSE 124* 210*  BUN 7* 9  CREATININE 0.71 0.70  CALCIUM 10.2 9.3   PT/INR No results for input(s): LABPROT, INR in the last 72 hours.  Reviewed CXR personally- small apical pneumothorax ? Maybe, less subcutaneous air, pigtail on the lateral chest wall, the curl is in the chest and no side ports should be out Studies/Results: Dg Chest Port 1 View  Result Date: 08/21/2019 CLINICAL DATA:  Recent pneumothorax EXAM: PORTABLE CHEST 1 VIEW COMPARISON:  August 20, 2019 FINDINGS: Chest tube remains on the left laterally. There is a rather minimal apical pneumothorax on the left without tension component. Subcutaneous air is again noted. There is atelectatic change in the lower lung regions. There may be mild associated infiltrate within these areas of atelectasis. No new opacity is evident. Heart size and pulmonary vascular normal. There is aortic atherosclerosis. No bone lesions.  IMPRESSION: Persistent chest tube on the left with slight left apical pneumothorax. No tension component. Subcutaneous air again noted. Lower lung region atelectatic change persists. A degree of superimposed pneumonia in these areas, particular on the left, cannot be excluded. No new opacity evident. Stable cardiac silhouette. Aortic Atherosclerosis (ICD10-I70.0). Electronically Signed   By: Lowella Grip III M.D.   On: 08/21/2019 07:01   Dg Chest Port 1 View  Result Date: 08/20/2019 CLINICAL DATA:  Follow up pneumothorax, left sided chest pain, sob. Hx of COPD, emphysema of lung, HTN, and current smoker EXAM: PORTABLE CHEST 1 VIEW COMPARISON:  Radiograph 08/18/2019 FINDINGS: Small LEFT apical pneumothorax with pleural edge measuring 8 mm from the apical chest wall. No change in volume. LEFT chest tube repositioned such that the pigtail is at the lateral chest wall. Small volume subcutaneous emphysema noted on the LEFT. Increase in subcutaneous emphysema in the LEFT neck. Mild increase in atelectasis or fluid along the LEFT horizontal fissure. Mild increase in atelectasis the RIGHT lung base additionally. IMPRESSION: 1. LEFT chest tube has been repositioned such that the pigtail is now at the lateral chest wall. 2. Stable small LEFT apical pneumothorax. 3. Interval increase in subcutaneous in the LEFT neck. 4. Interval increase in bibasilar atelectasis. Electronically Signed   By: Suzy Bouchard M.D.   On: 08/20/2019 07:05    Anti-infectives: Anti-infectives (From admission, onward)   Start     Dose/Rate Route Frequency Ordered Stop   08/20/19 1600  doxycycline (VIBRA-TABS) tablet 100 mg  100 mg Oral Every 12 hours 08/20/19 1445 08/25/19 2159   08/19/19 0200  azithromycin (ZITHROMAX) 500 mg in sodium chloride 0.9 % 250 mL IVPB  Status:  Discontinued     500 mg 250 mL/hr over 60 Minutes Intravenous Every 24 hours 08/19/19 0120 08/20/19 1445      Assessment/Plan: Glen Cobb is a 63 yo with  spontaneous pneumothorax. Doing fair. CXR with persistent small pneumothorax. No airleak. Placed to waterseal. -CXR in the AM -Plan for removal of CT tomorrow if stable and repeat CXR 4 hrs after  -If has acute symptoms, desat, pain, needs stat CXR and placed back on suction.   Updated RN and Dr. Dyann Kief.    LOS: 3 days    Glen Cobb 08/21/2019

## 2019-08-22 ENCOUNTER — Inpatient Hospital Stay (HOSPITAL_COMMUNITY): Payer: No Typology Code available for payment source

## 2019-08-22 DIAGNOSIS — Z720 Tobacco use: Secondary | ICD-10-CM

## 2019-08-22 LAB — GLUCOSE, CAPILLARY: Glucose-Capillary: 191 mg/dL — ABNORMAL HIGH (ref 70–99)

## 2019-08-22 MED ORDER — DEXTROMETHORPHAN HBR 15 MG/5ML PO SYRP: 10.0000 mL | ORAL_SOLUTION | Freq: Four times a day (QID) | ORAL | 0 refills | Status: DC | PRN

## 2019-08-22 MED ORDER — DOXYCYCLINE HYCLATE 100 MG PO TABS: 100.0000 mg | ORAL_TABLET | Freq: Two times a day (BID) | ORAL | 0 refills | Status: DC

## 2019-08-22 MED ORDER — PREDNISONE 20 MG PO TABS: ORAL_TABLET | ORAL | 0 refills | Status: AC

## 2019-08-22 NOTE — Progress Notes (Signed)
Nsg Discharge Note  Admit Date:  08/18/2019 Discharge date: 08/22/2019   Jermarius Kosa to be D/C'd Home per MD order.  AVS completed.  Copy for chart, and copy for patient signed, and dated. Removed IV-clean, dry, intact. Reviewed d/c paperwork with patient. Gave preprinted prescriptions. Wheeled stable patient and belongings to main entrance where he was picked up by his brother to d/c to home Patient/caregiver able to verbalize understanding.  Discharge Medication: Allergies as of 08/22/2019      Reactions   Codeine Itching   Hydrocodone-acetaminophen Itching   Other Hives   Strawberries causes hives      Medication List    TAKE these medications   ACYCLOVIR PO Take by mouth daily as needed.   albuterol-ipratropium 18-103 MCG/ACT inhaler Commonly known as: COMBIVENT Inhale 2 puffs into the lungs every 6 (six) hours as needed.   allopurinol 100 MG tablet Commonly known as: ZYLOPRIM Take 100 mg by mouth daily.   amLODipine 5 MG tablet Commonly known as: NORVASC Take 5 mg by mouth daily.   budesonide-formoterol 160-4.5 MCG/ACT inhaler Commonly known as: SYMBICORT Inhale 2 puffs into the lungs 2 (two) times daily.   dextromethorphan 15 MG/5ML syrup Take 10 mLs (30 mg total) by mouth 4 (four) times daily as needed for cough.   doxycycline 100 MG tablet Commonly known as: VIBRA-TABS Take 1 tablet (100 mg total) by mouth every 12 (twelve) hours.   gabapentin 600 MG tablet Commonly known as: NEURONTIN Take 600 mg by mouth 3 (three) times daily.   HYDROcodone-acetaminophen 5-325 MG tablet Commonly known as: NORCO/VICODIN 1 or 2 po q4h prn pain. Please take with food   ibuprofen 200 MG tablet Commonly known as: ADVIL Take by mouth.   predniSONE 20 MG tablet Commonly known as: Deltasone Take 3 tablets by mouth daily x1 day; then 2 tablets by mouth daily x2 days; then 1 tablet by mouth daily x3 days; then half tablet by mouth daily x3 days and stop prednisone.    Vitamin D3 50 MCG (2000 UT) capsule Take by mouth.       Discharge Assessment: Vitals:   08/22/19 0734 08/22/19 1400  BP:  (!) 141/87  Pulse:  88  Resp:  18  Temp:  98.3 F (36.8 C)  SpO2: 97% 95%   Skin clean, dry and intact without evidence of skin break down, no evidence of skin tears noted. IV catheter discontinued intact. Site without signs and symptoms of complications - no redness or edema noted at insertion site, patient denies c/o pain - only slight tenderness at site.  Dressing with slight pressure applied.  D/c Instructions-Education: Discharge instructions given to patient/family with verbalized understanding. D/c education completed with patient/family including follow up instructions, medication list, d/c activities limitations if indicated, with other d/c instructions as indicated by MD - patient able to verbalize understanding, all questions fully answered. Patient instructed to return to ED, call 911, or call MD for any changes in condition.  Patient escorted via [place], and D/C home via private auto.  Santa Lighter, RN 08/22/2019 5:30 PM

## 2019-08-22 NOTE — Progress Notes (Signed)
[facility] Progress Note     Subjective: Doing fair. Some pain still around the tube and in front and back of chest. Off O2.   Objective: Vital signs in last 24 hours: Temp:  [97.9 F (36.6 C)-98.3 F (36.8 C)] 97.9 F (36.6 C) (11/28 0500) Pulse Rate:  [63-77] 63 (11/28 0500) Resp:  [14-20] 16 (11/28 0500) BP: (144-151)/(80-88) 148/84 (11/28 0500) SpO2:  [93 %-97 %] 97 % (11/28 0734) FiO2 (%):  [21 %] 21 % (11/28 0116) Last BM Date: 08/21/19  Intake/Output from previous day: 11/27 0701 - 11/28 0700 In: 240 [P.O.:240] Out: -  Intake/Output this shift: No intake/output data recorded.  General appearance: alert, cooperative and no distress Resp: clear to auscultation bilaterally and no airleak on waterseal, chest tube removed without issues; dressing placed   Personally reviewed- no obvious pneumothorax, improved subcutaneous air.  Studies/Results: Dg Chest Port 1 View  Result Date: 08/22/2019 CLINICAL DATA:  Recent pneumothorax EXAM: PORTABLE CHEST 1 VIEW COMPARISON:  August 21, 2019 FINDINGS: Chest tube remains on the left. There is subcutaneous air on the left. No pneumothorax is evident currently. There is patchy atelectasis with questionable superimposed infiltrate in the lower lung regions, stable. There is bullous disease in the apices bilaterally. Heart size and pulmonary vascularity are normal. No adenopathy. There is aortic atherosclerosis. No bone lesions. IMPRESSION: Stable chest tube position on the left. There is subcutaneous air on the left but no demonstrable pneumothorax. There is stable atelectasis with questionable superimposed infiltrate in the lower lobes. No new opacity. There is bullous disease in the apices. Stable cardiac silhouette.  Aortic Atherosclerosis (ICD10-I70.0). Electronically Signed   By: Lowella Grip III M.D.   On: 08/22/2019 08:36   Dg Chest Port 1 View  Result Date: 08/21/2019 CLINICAL DATA:  Recent pneumothorax  EXAM: PORTABLE CHEST 1 VIEW COMPARISON:  August 20, 2019 FINDINGS: Chest tube remains on the left laterally. There is a rather minimal apical pneumothorax on the left without tension component. Subcutaneous air is again noted. There is atelectatic change in the lower lung regions. There may be mild associated infiltrate within these areas of atelectasis. No new opacity is evident. Heart size and pulmonary vascular normal. There is aortic atherosclerosis. No bone lesions. IMPRESSION: Persistent chest tube on the left with slight left apical pneumothorax. No tension component. Subcutaneous air again noted. Lower lung region atelectatic change persists. A degree of superimposed pneumonia in these areas, particular on the left, cannot be excluded. No new opacity evident. Stable cardiac silhouette. Aortic Atherosclerosis (ICD10-I70.0). Electronically Signed   By: Lowella Grip III M.D.   On: 08/21/2019 07:01    Anti-infectives: Anti-infectives (From admission, onward)   Start     Dose/Rate Route Frequency Ordered Stop   08/20/19 1600  doxycycline (VIBRA-TABS) tablet 100 mg     100 mg Oral Every 12 hours 08/20/19 1445 08/25/19 2159   08/19/19 0200  azithromycin (ZITHROMAX) 500 mg in sodium chloride 0.9 % 250 mL IVPB  Status:  Discontinued     500 mg 250 mL/hr over 60 Minutes Intravenous Every 24 hours 08/19/19 0120 08/20/19 1445      Assessment/Plan: Glen Cobb is a 63 yo with a spontaneous tension pneumothorax. Doing well.  -CXR with resolution. No airleak on waterseal. Removed chest tube at bedside and occlusive dressing placed. -Leave dressing on for 48 hours.  -CXR in 4 hrs for any interval change    LOS: 4 days    Glen Cobb 08/22/2019

## 2019-08-22 NOTE — Discharge Summary (Signed)
Physician Discharge Summary  Glen Cobb [id number] DOB: Sep 15, 1956 DOA: 08/18/2019  PCP: [name], MD  Admit date: 08/18/2019 Discharge date: 08/22/2019  Time spent: 35 minutes  Recommendations for Outpatient Follow-up:  1. Repeat CBC to assess polycythemia stability. 2. Outpatient follow-up with pulmonologist service for further adjustment on his COPD maintenance medication regimen. 3. Continue assisting as much as needed for complete smoking cessation 4. Reassess blood pressure and further adjust antihypertensive regimen as needed.   Discharge Diagnoses:  Principal Problem:   Spontaneous tension pneumothorax Active Problems:   COPD with acute exacerbation (HCC)   Hypertension   Polycythemia   Tobacco abuse GERD   Discharge Condition: Stable and improved.  Patient discharged home with instruction to follow-up with PCP in 10 days.  Diet recommendation: Heart healthy diet.  Filed Weights   08/18/19 2208 08/19/19 0640  Weight: 75.8 kg 75.3 kg    History of present illness:  As per H&P by Dr. Myna Hidalgo on 08/18/2019 63 y.o.malewith medical history significant forCOPD, spontaneous right-sided pneumothorax roughly 40 years ago, spinal stenosis, and tobacco abuse in the process of quitting, now presenting with acute onset of left-sided chest pain and shortness of breath. The patient reports that for the past several days he has had increased cough and increased sputum production without any fevers, chills, or chest pain. He was at rest this evening, watching TV, when he developed acute onset of severe left-sided chest pain and shortness of breath. He had not been coughing at the time and denies any recent trauma or procedure. He describes similar experience roughly 40 years ago when he had a spontaneous right-sided pneumothorax.  ED Course:Upon arrival to the ED, patient is found to be saturating upper 80s on room air, tachypneic in the mid 20s, tachycardic to 120,  and with stable blood pressure. EKG features sinus tachycardia with rate 122 and nonspecific IVCD. Chest x-ray is concerning for large left-sided pneumothorax with tension component and shift to the right. Chemistry panel features a mild hypokalemia. CBC is notable for hemoglobin of 20.9 and hematocrit 60.9. High-sensitivity troponins and BNP are normal. Pigtail catheter was inserted into the left pleural space and repeat chest x-ray demonstrates reexpansion of the left lung with small residual left apical pneumothorax and improved mediastinal shift. Patient was treated with albuterol, Solu-Medrol, Dilaudid, and morphine in the emergency department. Surgery was consulted by the ED physician and COVID-19 screening test is still pending.  Hospital Course:  1-spontaneous tension pneumothorax -Patient with prior history of a spontaneous pneumothorax, approximately 40 years ago. -Most likely from blood pressure of underlying bullae in the setting of COPD exacerbation, coughing spells and ongoing tobacco abuse. -Chest tube removed on 08/22/2019; no residual pneumothorax appreciated.  Hemodynamically stable. -Continue treatment for COPD exacerbation as mentioned below. -Patient encouraged to quit smoking -Continue oxygen supplementation and wean off as tolerated. -Repeated chest x-ray demonstrated no residual pneumothorax; still with mild left apical emphysematous changes.  2-COPD with acute exacerbation ([place]) -Excellent response to nebulizer management and acute treatment. -At discharge patient will complete his steroids tapering, resume inhaler bronchodilators and maintenance treatment (Combivent Symbicort); complete doxycycline antibiotic therapy for underlying bronchiectasis and follow instructions for continue use of flutter valve/incentive spirometer. -Prescription for antitussive medication has been provided. -Continue outpatient follow-up with pulmonology therapy for further adjustment on  his regimen. -Covid test negative. -No requirement of oxygen supplementation at time of discharge.  3-Hypertension -Stable overall. -Will continue amlodipine as previously prescribed. -Heart healthy diet has been encouraged.  4-idiopathic neuropathy/chronic  pain -Continue Neurontin 3 times daily. -continue PRN analgesics  5-Polycythemia -In the setting of chronic tobacco abuse -Continue to monitor blood count. -Cessation counseling provided.  6-GI prophylaxis/GERD -Continue Pepcid.  7-tobacco abuse -Cessation counseling has been provided -Continue the use of nicotine patch.  Procedures:  See below for x-ray reports.  Consultations:  General surgery  Discharge Exam: Vitals:   08/22/19 0734 08/22/19 1400  BP:  (!) 141/87  Pulse:  88  Resp:  18  Temp:  98.3 F (36.8 C)  SpO2: 97% 95%    General: Afebrile, breathing easier, speaking in full sentences and requiring oxygen supplementation.  Still with mild left upper chest discomfort on palpation but denies frank chest pain. Cardiovascular: S1 and S2, no rubs, no gallops, no JVD. Respiratory: Improved air movement bilaterally, very mild expiratory wheezing and positive rhonchi.  No using accessory muscles; normal respiratory effort. Abdomen: Soft, nontender, nondistended, positive bowel sounds Extremities: No cyanosis, no clubbing.  Discharge Instructions   Discharge Instructions    Diet - low sodium heart healthy   Complete by: As directed    Discharge instructions   Complete by: As directed    Take medications as prescribed Arrange follow-up with PCP in 10 days Follow-up with pulmonologist as previously scheduled Stop smoking Maintain adequate hydration.     Allergies as of 08/22/2019      Reactions   Codeine Itching   Hydrocodone-acetaminophen Itching   Other Hives   Strawberries causes hives      Medication List    TAKE these medications   ACYCLOVIR PO Take by mouth daily as needed.    albuterol-ipratropium 18-103 MCG/ACT inhaler Commonly known as: COMBIVENT Inhale 2 puffs into the lungs every 6 (six) hours as needed.   allopurinol 100 MG tablet Commonly known as: ZYLOPRIM Take 100 mg by mouth daily.   amLODipine 5 MG tablet Commonly known as: NORVASC Take 5 mg by mouth daily.   budesonide-formoterol 160-4.5 MCG/ACT inhaler Commonly known as: SYMBICORT Inhale 2 puffs into the lungs 2 (two) times daily.   dextromethorphan 15 MG/5ML syrup Take 10 mLs (30 mg total) by mouth 4 (four) times daily as needed for cough.   doxycycline 100 MG tablet Commonly known as: VIBRA-TABS Take 1 tablet (100 mg total) by mouth every 12 (twelve) hours.   gabapentin 600 MG tablet Commonly known as: NEURONTIN Take 600 mg by mouth 3 (three) times daily.   HYDROcodone-acetaminophen 5-325 MG tablet Commonly known as: NORCO/VICODIN 1 or 2 po q4h prn pain. Please take with food   ibuprofen 200 MG tablet Commonly known as: ADVIL Take by mouth.   predniSONE 20 MG tablet Commonly known as: Deltasone Take 3 tablets by mouth daily x1 day; then 2 tablets by mouth daily x2 days; then 1 tablet by mouth daily x3 days; then half tablet by mouth daily x3 days and stop prednisone.   Vitamin D3 50 MCG (2000 UT) capsule Take by mouth.      Allergies  Allergen Reactions  . Codeine Itching  . Hydrocodone-Acetaminophen Itching  . Other Hives    Strawberries causes hives   Follow-up Information    Mulles, Corazon, MD. Schedule an appointment as soon as possible for a visit in 10 day(s).   Specialty: Internal Medicine Contact information: [place] [address] [phone]           The results of significant diagnostics from this hospitalization (including imaging, microbiology, ancillary and laboratory) are listed below for reference.  Significant Diagnostic Studies: Dg Chest 1v Repeat Same Day  Result Date: 08/18/2019 CLINICAL DATA:  Chest tube  placement EXAM: CHEST - 1 VIEW SAME DAY COMPARISON:  Radiograph 08/18/2019 FINDINGS: Interval placement of a left-sided pigtail chest tube with re-expansion of the left lung. Small residual apical pneumothorax is present. Some residual hazy opacity in the bases may reflect atelectatic lung or re-expansion edema in the left lung. There are biapical areas of scarring and basilar airways thickening. IMPRESSION: Re-expansion of the left lung post placement of a pigtail pleural drain. Small residual left apical pneumothorax. Improved mediastinal shift. Extensive left chest wall subcutaneous emphysema. Electronically Signed   By: Lovena Le M.D.   On: 08/18/2019 22:53   Dg Chest Port 1 View  Result Date: 08/22/2019 CLINICAL DATA:  Recent pneumothorax EXAM: PORTABLE CHEST 1 VIEW COMPARISON:  August 21, 2019 FINDINGS: Chest tube remains on the left. There is subcutaneous air on the left. No pneumothorax is evident currently. There is patchy atelectasis with questionable superimposed infiltrate in the lower lung regions, stable. There is bullous disease in the apices bilaterally. Heart size and pulmonary vascularity are normal. No adenopathy. There is aortic atherosclerosis. No bone lesions. IMPRESSION: Stable chest tube position on the left. There is subcutaneous air on the left but no demonstrable pneumothorax. There is stable atelectasis with questionable superimposed infiltrate in the lower lobes. No new opacity. There is bullous disease in the apices. Stable cardiac silhouette.  Aortic Atherosclerosis (ICD10-I70.0). Electronically Signed   By: Lowella Grip III M.D.   On: 08/22/2019 08:36   Dg Chest Port 1 View  Result Date: 08/21/2019 CLINICAL DATA:  Recent pneumothorax EXAM: PORTABLE CHEST 1 VIEW COMPARISON:  August 20, 2019 FINDINGS: Chest tube remains on the left laterally. There is a rather minimal apical pneumothorax on the left without tension component. Subcutaneous air is again noted. There  is atelectatic change in the lower lung regions. There may be mild associated infiltrate within these areas of atelectasis. No new opacity is evident. Heart size and pulmonary vascular normal. There is aortic atherosclerosis. No bone lesions. IMPRESSION: Persistent chest tube on the left with slight left apical pneumothorax. No tension component. Subcutaneous air again noted. Lower lung region atelectatic change persists. A degree of superimposed pneumonia in these areas, particular on the left, cannot be excluded. No new opacity evident. Stable cardiac silhouette. Aortic Atherosclerosis (ICD10-I70.0). Electronically Signed   By: Lowella Grip III M.D.   On: 08/21/2019 07:01   Dg Chest Port 1 View  Result Date: 08/20/2019 CLINICAL DATA:  Follow up pneumothorax, left sided chest pain, sob. Hx of COPD, emphysema of lung, HTN, and current smoker EXAM: PORTABLE CHEST 1 VIEW COMPARISON:  Radiograph 08/18/2019 FINDINGS: Small LEFT apical pneumothorax with pleural edge measuring 8 mm from the apical chest wall. No change in volume. LEFT chest tube repositioned such that the pigtail is at the lateral chest wall. Small volume subcutaneous emphysema noted on the LEFT. Increase in subcutaneous emphysema in the LEFT neck. Mild increase in atelectasis or fluid along the LEFT horizontal fissure. Mild increase in atelectasis the RIGHT lung base additionally. IMPRESSION: 1. LEFT chest tube has been repositioned such that the pigtail is now at the lateral chest wall. 2. Stable small LEFT apical pneumothorax. 3. Interval increase in subcutaneous in the LEFT neck. 4. Interval increase in bibasilar atelectasis. Electronically Signed   By: Suzy Bouchard M.D.   On: 08/20/2019 07:05   Dg Chest Portable 1 View  Result Date: 08/18/2019 CLINICAL  DATA:  Shortness of breath, decreased breath sounds on the left. EXAM: PORTABLE CHEST 1 VIEW COMPARISON:  None. FINDINGS: Cardiac shadow is within normal limits. Aortic  calcifications are seen. Large left-sided pneumothorax is noted of at least 60% with mild mediastinal shift to the right. No focal infiltrate is seen. Some scarring is noted in the apices bilateral likely related to emphysematous changes and bullous disease. IMPRESSION: Large left-sided pneumothorax with tension component and shift to the right. Critical Value/emergent results were called by telephone at the time of interpretation on 08/18/2019 at 10:22 pm to Dr. Ezequiel Essex , who verbally acknowledged these results. Electronically Signed   By: Inez Catalina M.D.   On: 08/18/2019 22:23   Dg Chest Port 1v Same Day  Result Date: 08/22/2019 CLINICAL DATA:  Chest tube removal EXAM: PORTABLE CHEST 1 VIEW COMPARISON:  August 14, 2019 FINDINGS: The left chest tube is been removed. No left-sided pneumothorax is identified. Bibasilar opacities are identified with platelike components. No other interval changes. IMPRESSION: No pneumothorax after left chest tube removal. Opacities in the bases are stable. I suspect at least a component of atelectasis. Superimposed infiltrate is not completely excluded. Recommend attention on follow-up. Electronically Signed   By: Dorise Bullion III M.D   On: 08/22/2019 13:45    Microbiology: Recent Results (from the past 240 hour(s))  SARS CORONAVIRUS 2 (TAT 6-24 HRS) Nasopharyngeal Nasopharyngeal Swab     Status: None   Collection Time: 08/18/19 10:11 PM   Specimen: Nasopharyngeal Swab  Result Value Ref Range Status   SARS Coronavirus 2 NEGATIVE NEGATIVE Final    Comment: (NOTE) SARS-CoV-2 target nucleic acids are NOT DETECTED. The SARS-CoV-2 RNA is generally detectable in upper and lower respiratory specimens during the acute phase of infection. Negative results do not preclude SARS-CoV-2 infection, do not rule out co-infections with other pathogens, and should not be used as the sole basis for treatment or other patient management decisions. Negative results must  be combined with clinical observations, patient history, and epidemiological information. The expected result is Negative. Fact Sheet for Patients: SugarRoll.be Fact Sheet for Healthcare Providers: https://www.woods-mathews.com/ This test is not yet approved or cleared by the [place] FDA and  has been authorized for detection and/or diagnosis of SARS-CoV-2 by FDA under an Emergency Use Authorization (EUA). This EUA will remain  in effect (meaning this test can be used) for the duration of the COVID-19 declaration under Section 56 4(b)(1) of the Act, 21 U.S.C. section 360bbb-3(b)(1), unless the authorization is terminated or revoked sooner. Performed at [facility], [place] [address], [place], [place] [address]   MRSA PCR Screening     Status: None   Collection Time: 08/19/19  6:35 AM   Specimen: Nasopharyngeal  Result Value Ref Range Status   MRSA by PCR NEGATIVE NEGATIVE Final    Comment:        The GeneXpert MRSA Assay (FDA approved for NASAL specimens only), is one component of a comprehensive MRSA colonization surveillance program. It is not intended to diagnose MRSA infection nor to guide or monitor treatment for MRSA infections. Performed at [facility], [address] [place], [place] [address]      Labs: Basic Metabolic Panel: Recent Labs  Lab 08/18/19 2210 08/19/19 0343  NA 139 137  K 3.4* 3.9  CL 99 100  CO2 22 23  GLUCOSE 124* 210*  BUN 7* 9  CREATININE 0.71 0.70  CALCIUM 10.2 9.3  MG  --  2.1  Liver Function Tests: Recent Labs  Lab 08/18/19 2210  AST 38  ALT 34  ALKPHOS 96  BILITOT 0.9  PROT 8.8*  ALBUMIN 4.5   CBC: Recent Labs  Lab 08/18/19 2210 08/19/19 0343  WBC 9.1 12.3*  NEUTROABS 3.8 11.3*  HGB 20.9* 19.4*  HCT 60.9* 56.7*  MCV 97.3 99.8  PLT 279 230   BNP (last 3 results) Recent Labs    08/18/19 2210  BNP 32.0   CBG: Recent Labs  Lab 08/19/19 0813 08/20/19 0729  08/22/19 0744  GLUCAP 182* 145* 191*    Signed:  Barton Dubois MD.  [facility] 08/22/2019, 2:24 PM

## 2019-08-22 NOTE — Progress Notes (Signed)
[facility]  CXR reviewed. No obvious pneumothorax on my read, stable subcutaneous emphysema as prior.  Will await the official read before able to discharge.   Curlene Labrum, MD [facility] [address] [place], [place] [address] [phone] (office)

## 2019-08-22 NOTE — Plan of Care (Signed)
  Problem: Education: Goal: Knowledge of General Education information will improve Description: Including pain rating scale, medication(s)/side effects and non-pharmacologic comfort measures 08/22/2019 0757 by Santa Lighter, RN Outcome: Progressing 08/21/2019 2017 by Santa Lighter, RN Outcome: Progressing   Problem: Health Behavior/Discharge Planning: Goal: Ability to manage health-related needs will improve 08/22/2019 0757 by Santa Lighter, RN Outcome: Progressing 08/21/2019 2017 by Santa Lighter, RN Outcome: Progressing

## 2019-08-22 NOTE — Plan of Care (Signed)
  Problem: Education: Goal: Knowledge of General Education information will improve Description: Including pain rating scale, medication(s)/side effects and non-pharmacologic comfort measures 08/22/2019 1729 by Santa Lighter, RN Outcome: Adequate for Discharge 08/22/2019 0757 by Santa Lighter, RN Outcome: Progressing   Problem: Health Behavior/Discharge Planning: Goal: Ability to manage health-related needs will improve 08/22/2019 1729 by Santa Lighter, RN Outcome: Adequate for Discharge 08/22/2019 0757 by Santa Lighter, RN Outcome: Progressing   Problem: Clinical Measurements: Goal: Ability to maintain clinical measurements within normal limits will improve Outcome: Adequate for Discharge Goal: Will remain free from infection Outcome: Adequate for Discharge Goal: Diagnostic test results will improve Outcome: Adequate for Discharge Goal: Respiratory complications will improve Outcome: Adequate for Discharge Goal: Cardiovascular complication will be avoided Outcome: Adequate for Discharge   Problem: Activity: Goal: Risk for activity intolerance will decrease Outcome: Adequate for Discharge   Problem: Nutrition: Goal: Adequate nutrition will be maintained Outcome: Adequate for Discharge   Problem: Coping: Goal: Level of anxiety will decrease Outcome: Adequate for Discharge   Problem: Elimination: Goal: Will not experience complications related to bowel motility Outcome: Adequate for Discharge Goal: Will not experience complications related to urinary retention Outcome: Adequate for Discharge   Problem: Pain Managment: Goal: General experience of comfort will improve Outcome: Adequate for Discharge   Problem: Safety: Goal: Ability to remain free from injury will improve Outcome: Adequate for Discharge   Problem: Skin Integrity: Goal: Risk for impaired skin integrity will decrease Outcome: Adequate for Discharge

## 2019-08-22 NOTE — Progress Notes (Signed)
Final read no pneumothorax.    Glen Cobb

## 2020-07-25 ENCOUNTER — Telehealth (HOSPITAL_COMMUNITY): Payer: Self-pay | Admitting: *Deleted

## 2020-07-25 NOTE — Telephone Encounter (Signed)
Received [facility] Authorization - [id number] for this pt to participate in pulmonary rehab here at [place].  Noted that pt lives in [place].  Called and left message for pt requesting a call back to determine pt preference of location. Await return of call. Cherre Huger, BSN Cardiac and Training and development officer

## 2020-10-17 ENCOUNTER — Encounter (HOSPITAL_COMMUNITY)
Admission: RE | Admit: 2020-10-17 | Discharge: 2020-10-17 | Disposition: A | Discharge status: Home / Self Care | Payer: No Typology Code available for payment source | Source: Ambulatory Visit | Attending: Critical Care Medicine | Admitting: Critical Care Medicine

## 2020-10-17 ENCOUNTER — Encounter (HOSPITAL_COMMUNITY): Payer: Self-pay

## 2020-10-17 ENCOUNTER — Other Ambulatory Visit: Payer: Self-pay

## 2020-10-17 VITALS — BP 132/78 | HR 77 | Ht 69.0 in | Wt 175.5 lb

## 2020-10-17 DIAGNOSIS — J449 Chronic obstructive pulmonary disease, unspecified: Secondary | ICD-10-CM | POA: Diagnosis not present

## 2020-10-17 NOTE — Progress Notes (Signed)
Cardiac/Pulmonary Rehab Medication Review by a Pharmacist  Does the patient  feel that his/her medications are working for him/her?  yes  Has the patient been experiencing any side effects to the medications prescribed?  no  Does the patient measure his/her own blood pressure or blood glucose at home?  yes   Does the patient have any problems obtaining medications due to transportation or finances?   no  Understanding of regimen: good Understanding of indications: good Potential of compliance: good  Questions asked to Determine Patient Understanding of Medication Regimen:  1. What is the name of the medication?  2. What is the medication used for?  3. When should it be taken?  4. How much should be taken?  5. How will you take it?  6. What side effects should you report?  Understanding Defined as: Excellent: All questions above are correct Good: Questions 1-4 are correct Fair: Questions 1-2 are correct  Poor: 1 or none of the above questions are correct   Pharmacist comments: Overall, patient has good understanding of medications and high compliance.  Patient is switching inhalers once his current supply runs out- will inform us of what new inhaler is.    Ramond Craver 10/17/2020 2:06 PM

## 2020-10-17 NOTE — Progress Notes (Signed)
Pulmonary Individual Treatment Plan  Patient Details  Name: Glen Cobb MRN: [id number] Date of Birth: 04/04/1956 Referring Provider:   Flowsheet Row PULMONARY REHAB COPD ORIENTATION from 10/17/2020 in [place]  Referring Provider Dr. Rosaria Ferries      Initial Encounter Date:  Flowsheet Row PULMONARY REHAB COPD ORIENTATION from 10/17/2020 in [place]  Date 10/17/20      Visit Diagnosis: Chronic obstructive pulmonary disease, unspecified COPD type ([place])  Patient's Home Medications on Admission:   Current Outpatient Medications:  .  Acidophilus Lactobacillus CAPS, Take 2 tablets by mouth daily at 12 noon., Disp: , Rfl:  .  acyclovir (ZOVIRAX) 400 MG tablet, Take 400 mg by mouth 2 (two) times daily as needed (for flareup)., Disp: , Rfl:  .  cyclobenzaprine (FLEXERIL) 10 MG tablet, Take 15 mg by mouth at bedtime., Disp: , Rfl:  .  hydrOXYzine (VISTARIL) 25 MG capsule, Take 25 mg by mouth every 6 (six) hours as needed for itching (sleep)., Disp: , Rfl:  .  lidocaine (LIDODERM) 5 %, Place 1 patch onto the skin daily. Remove & Discard patch within 12 hours or as directed by MD, Disp: , Rfl:  .  omeprazole (PRILOSEC) 40 MG capsule, Take 40 mg by mouth daily., Disp: , Rfl:  .  potassium chloride (KLOR-CON) 10 MEQ tablet, Take 10 mEq by mouth daily., Disp: , Rfl:  .  Propylene Glycol (SYSTANE BALANCE OP), Apply 1 drop to eye 4 (four) times daily. Both eyes, Disp: , Rfl:  .  Riboflavin 100 MG TABS, Take 400 mg by mouth daily., Disp: , Rfl:  .  sildenafil (VIAGRA) 100 MG tablet, Take 50 mg by mouth daily as needed for erectile dysfunction., Disp: , Rfl:  .  Tiotropium Bromide Monohydrate (SPIRIVA RESPIMAT) 2.5 MCG/ACT AERS, Inhale 2 puffs into the lungs daily., Disp: , Rfl:  .  traMADol (ULTRAM) 50 MG tablet, Take 100 mg by mouth 3 (three) times daily as needed for moderate pain., Disp: , Rfl:  .  albuterol-ipratropium (COMBIVENT) 18-103 MCG/ACT  inhaler, Inhale 1 puff into the lungs every 6 (six) hours as needed., Disp: , Rfl:  .  allopurinol (ZYLOPRIM) 100 MG tablet, Take 100 mg by mouth daily., Disp: , Rfl:  .  amLODipine (NORVASC) 5 MG tablet, Take 10 mg by mouth daily., Disp: , Rfl:  .  budesonide-formoterol (SYMBICORT) 160-4.5 MCG/ACT inhaler, Inhale 2 puffs into the lungs 2 (two) times daily., Disp: , Rfl:  .  gabapentin (NEURONTIN) 600 MG tablet, Take 1,200 mg by mouth 3 (three) times daily., Disp: , Rfl:  .  predniSONE (DELTASONE) 20 MG tablet, Take 3 tablets by mouth daily x1 day; then 2 tablets by mouth daily x2 days; then 1 tablet by mouth daily x3 days; then half tablet by mouth daily x3 days and stop prednisone. (Patient taking differently: Take 3 tablets by mouth daily x1 day; then 2 tablets by mouth daily x2 days; then 1 tablet by mouth daily x3 days; then half tablet by mouth daily x3 days and stop prednisone. As needed for gout flareup), Disp: 30 tablet, Rfl: 0  Past Medical History: Past Medical History:  Diagnosis Date  . Arthritis   . Cancer ([place])    bladder cancer x 2  . Cataract    MD just watching  . COPD (chronic obstructive pulmonary disease) ([place])   . Emphysema of lung ([place])   . FH: migraines    last one 1 yr ago - OTC  med prn  . Gout   . HSV infection   . Hyperlipidemia    no med. diet  . Hypertension   . Kidney stone    passed stone - no surgery  . Osteoporosis   . Sleep apnea    Does not use CPAP nightly.    Tobacco Use: Social History   Tobacco Use  Smoking Status Current Every Day Smoker  . Packs/day: 0.25  . Years: 46.00  . Pack years: 11.50  . Types: Cigarettes  Smokeless Tobacco Former Systems developer  . Types: Snuff  Tobacco Comment   using a nicotine patch, trying to quit     Labs: Recent Review Flowsheet Data   There is no flowsheet data to display.     Capillary Blood Glucose: Lab Results  Component Value Date   GLUCAP 191 (H) 08/22/2019   GLUCAP 145 (H) 08/20/2019   GLUCAP  182 (H) 08/19/2019     Pulmonary Assessment Scores:  Pulmonary Assessment Scores    Row Name 10/17/20 1329         ADL UCSD   ADL Phase Entry     SOB Score total 24           CAT Score   CAT Score 22           mMRC Score   mMRC Score 2           UCSD: Self-administered rating of dyspnea associated with activities of daily living (ADLs) 6-point scale (0 = "not at all" to 5 = "maximal or unable to do because of breathlessness")  Scoring Scores range from 0 to 120.  Minimally important difference is 5 units  CAT: CAT can identify the health impairment of COPD patients and is better correlated with disease progression.  CAT has a scoring range of zero to 40. The CAT score is classified into four groups of low (less than 10), medium (10 - 20), high (21-30) and very high (31-40) based on the impact level of disease on health status. A CAT score over 10 suggests significant symptoms.  A worsening CAT score could be explained by an exacerbation, poor medication adherence, poor inhaler technique, or progression of COPD or comorbid conditions.  CAT MCID is 2 points  mMRC: mMRC (Modified Medical Research Council) Dyspnea Scale is used to assess the degree of baseline functional disability in patients of respiratory disease due to dyspnea. No minimal important difference is established. A decrease in score of 1 point or greater is considered a positive change.   Pulmonary Function Assessment:  Pulmonary Function Assessment - 10/17/20 1325      Breath   Shortness of Breath Yes;Limiting activity;Fear of Shortness of Breath           Exercise Target Goals: Exercise Program Goal: Individual exercise prescription set using results from initial 6 min walk test and THRR while considering  patient's activity barriers and safety.   Exercise Prescription Goal: Initial exercise prescription builds to 30-45 minutes a day of aerobic activity, 2-3 days per week.  Home exercise guidelines  will be given to patient during program as part of exercise prescription that the participant will acknowledge.  Activity Barriers & Risk Stratification:  Activity Barriers & Cardiac Risk Stratification - 10/17/20 1322      Activity Barriers & Cardiac Risk Stratification   Activity Barriers Arthritis;Back Problems;Neck/Spine Problems;Joint Problems;Deconditioning;Muscular Weakness;Shortness of Breath;Balance Concerns;History of Falls    Cardiac Risk Stratification Moderate  6 Minute Walk:  6 Minute Walk    Row Name 10/17/20 1459         6 Minute Walk   Phase Initial     Distance 1100 feet     Walk Time 6 minutes     # of Rest Breaks 0     MPH 2.08     METS 3.14     RPE 9     Perceived Dyspnea  11     VO2 Peak 10.99     Symptoms Yes (comment)     Comments lower back pain 5/10, left foot pain 3/10, right hip pain 3/10     Resting HR 77 bpm     Resting BP 132/78     Resting Oxygen Saturation  94 %     Exercise Oxygen Saturation  during 6 min walk 88 %     Max Ex. HR 94 bpm     Max Ex. BP 142/78     2 Minute Post BP 128/76           Interval HR   1 Minute HR 92     2 Minute HR 91     3 Minute HR 92     4 Minute HR 92     5 Minute HR 94     6 Minute HR 90     2 Minute Post HR 87     Interval Heart Rate? Yes           Interval Oxygen   Interval Oxygen? Yes     Baseline Oxygen Saturation % 94 %     1 Minute Oxygen Saturation % 92 %     1 Minute Liters of Oxygen 0 L     2 Minute Oxygen Saturation % 92 %     2 Minute Liters of Oxygen 0 L     3 Minute Oxygen Saturation % 91 %     3 Minute Liters of Oxygen 0 L     4 Minute Oxygen Saturation % 91 %     4 Minute Liters of Oxygen 0 L     5 Minute Oxygen Saturation % 89 %     5 Minute Liters of Oxygen 0 L     6 Minute Oxygen Saturation % 88 %     6 Minute Liters of Oxygen 0 L     2 Minute Post Oxygen Saturation % 97 %     2 Minute Post Liters of Oxygen 0 L            Oxygen Initial Assessment:   Oxygen Initial Assessment - 10/17/20 1458      Initial 6 min Walk   Oxygen Used None      Program Oxygen Prescription   Program Oxygen Prescription None      Intervention   Short Term Goals To learn and exhibit compliance with exercise, home and travel O2 prescription;To learn and understand importance of monitoring SPO2 with pulse oximeter and demonstrate accurate use of the pulse oximeter.;To learn and understand importance of maintaining oxygen saturations>88%;To learn and demonstrate proper pursed lip breathing techniques or other breathing techniques.;To learn and demonstrate proper use of respiratory medications    Long  Term Goals Exhibits compliance with exercise, home and travel O2 prescription;Verbalizes importance of monitoring SPO2 with pulse oximeter and return demonstration;Maintenance of O2 saturations>88%;Exhibits proper breathing techniques, such as pursed lip breathing or other method taught during program session;Compliance with respiratory medication;Demonstrates proper  use of MDI's           Oxygen Re-Evaluation:   Oxygen Discharge (Final Oxygen Re-Evaluation):   Initial Exercise Prescription:  Initial Exercise Prescription - 10/17/20 1500      Date of Initial Exercise RX and Referring Provider   Date 10/17/20    Referring Provider Dr. Rosaria Ferries    Expected Discharge Date 01/02/21   This is his current [place] auth. date. We have requested it be extended.     NuStep   Level 1    SPM 60    Minutes 39      Prescription Details   Frequency (times per week) 2    Duration Progress to 30 minutes of continuous aerobic without signs/symptoms of physical distress      Intensity   THRR 40-80% of Max Heartrate 63-126    Ratings of Perceived Exertion 11-13    Perceived Dyspnea 0-4      Progression   Progression Continue progressive overload as per policy without signs/symptoms or physical distress.      Resistance Training   Training Prescription Yes    Weight 3  lbs    Reps 10-15           Perform Capillary Blood Glucose checks as needed.  Exercise Prescription Changes:   Exercise Comments:   Exercise Goals and Review:  Exercise Goals    Row Name 10/17/20 1506             Exercise Goals   Increase Physical Activity Yes       Intervention Provide advice, education, support and counseling about physical activity/exercise needs.;Develop an individualized exercise prescription for aerobic and resistive training based on initial evaluation findings, risk stratification, comorbidities and participant's personal goals.       Expected Outcomes Short Term: Attend rehab on a regular basis to increase amount of physical activity.;Long Term: Add in home exercise to make exercise part of routine and to increase amount of physical activity.;Long Term: Exercising regularly at least 3-5 days a week.       Increase Strength and Stamina Yes       Intervention Provide advice, education, support and counseling about physical activity/exercise needs.;Develop an individualized exercise prescription for aerobic and resistive training based on initial evaluation findings, risk stratification, comorbidities and participant's personal goals.       Expected Outcomes Short Term: Increase workloads from initial exercise prescription for resistance, speed, and METs.;Short Term: Perform resistance training exercises routinely during rehab and add in resistance training at home;Long Term: Improve cardiorespiratory fitness, muscular endurance and strength as measured by increased METs and functional capacity (6MWT)       Able to understand and use rate of perceived exertion (RPE) scale Yes       Intervention Provide education and explanation on how to use RPE scale       Expected Outcomes Short Term: Able to use RPE daily in rehab to express subjective intensity level;Long Term:  Able to use RPE to guide intensity level when exercising independently       Able to understand  and use Dyspnea scale Yes       Intervention Provide education and explanation on how to use Dyspnea scale       Expected Outcomes Short Term: Able to use Dyspnea scale daily in rehab to express subjective sense of shortness of breath during exertion;Long Term: Able to use Dyspnea scale to guide intensity level when exercising independently  Knowledge and understanding of Target Heart Rate Range (THRR) Yes       Intervention Provide education and explanation of THRR including how the numbers were predicted and where they are located for reference       Expected Outcomes Short Term: Able to state/look up THRR;Long Term: Able to use THRR to govern intensity when exercising independently;Short Term: Able to use daily as guideline for intensity in rehab       Understanding of Exercise Prescription Yes       Intervention Provide education, explanation, and written materials on patient's individual exercise prescription       Expected Outcomes Short Term: Able to explain program exercise prescription;Long Term: Able to explain home exercise prescription to exercise independently              Exercise Goals Re-Evaluation :   Discharge Exercise Prescription (Final Exercise Prescription Changes):   Nutrition:  Target Goals: Understanding of nutrition guidelines, daily intake of sodium 1500mg , cholesterol 200mg , calories 30% from fat and 7% or less from saturated fats, daily to have 5 or more servings of fruits and vegetables.  Biometrics:  Pre Biometrics - 10/17/20 1506      Pre Biometrics   Height 5\' 9"  (1.753 m)    Weight 175 lb 7.8 oz (79.6 kg)    Waist Circumference 39.5 inches    Hip Circumference 38.5 inches    Waist to Hip Ratio 1.03 %    BMI (Calculated) 25.9    Triceps Skinfold 22 mm    % Body Fat 28 %    Grip Strength 25.2 kg    Flexibility 0 in    Single Leg Stand 52 seconds            Nutrition Therapy Plan and Nutrition Goals:   Nutrition Assessments:   Nutrition Assessments - 10/17/20 1340      MEDFICTS Scores   Pre Score 62          MEDIFICTS Score Key:  ?70 Need to make dietary changes   40-70 Heart Healthy Diet  ? 40 Therapeutic Level Cholesterol Diet   Picture Your Plate Scores:  [id number] Unhealthy dietary pattern with much room for improvement.  41-50 Dietary pattern unlikely to meet recommendations for good health and room for improvement.  51-60 More healthful dietary pattern, with some room for improvement.   >60 Healthy dietary pattern, although there may be some specific behaviors that could be improved.    Nutrition Goals Re-Evaluation:   Nutrition Goals Discharge (Final Nutrition Goals Re-Evaluation):   Psychosocial: Target Goals: Acknowledge presence or absence of significant depression and/or stress, maximize coping skills, provide positive support system. Participant is able to verbalize types and ability to use techniques and skills needed for reducing stress and depression.  Initial Review & Psychosocial Screening:  Initial Psych Review & Screening - 10/17/20 1318      Initial Review   Current issues with Current Sleep Concerns   He has a hard time falling asleep and staying asleep. He cannot sleep more than a couple of hours at a time. He has been tested for OSA, but he has not gotten his results back yet.     Family Dynamics   Good Support System? Yes    Comments His older brother and younger brother support him. He has some friends that he can call and talk to about his problems.      Barriers   Psychosocial barriers to participate in program There are no  identifiable barriers or psychosocial needs.      Screening Interventions   Interventions Encouraged to exercise    Expected Outcomes Short Term goal: Utilizing psychosocial counselor, staff and physician to assist with identification of specific Stressors or current issues interfering with healing process. Setting desired goal for each stressor  or current issue identified.;Long Term goal: The participant improves quality of Life and PHQ9 Scores as seen by post scores and/or verbalization of changes           Quality of Life Scores:  Quality of Life - 10/17/20 1456      Quality of Life   Select Quality of Life      Quality of Life Scores   Health/Function Pre 21.19 %    Socioeconomic Pre 26.92 %    Psych/Spiritual Pre 24.29 %    Family Pre 25.5 %    GLOBAL Pre 23.34 %          Scores of 19 and below usually indicate a poorer quality of life in these areas.  A difference of  2-3 points is a clinically meaningful difference.  A difference of 2-3 points in the total score of the Quality of Life Index has been associated with significant improvement in overall quality of life, self-image, physical symptoms, and general health in studies assessing change in quality of life.   PHQ-9: Recent Review Flowsheet Data    Depression screen [facility] 2/9 10/17/2020   Decreased Interest 1   Down, Depressed, Hopeless 0   PHQ - 2 Score 1   Altered sleeping 2   Tired, decreased energy 1   Change in appetite 0   Feeling bad or failure about yourself  0   Trouble concentrating 0   Moving slowly or fidgety/restless 0   Suicidal thoughts 0   PHQ-9 Score 4   Difficult doing work/chores Not difficult at all     Interpretation of Total Score  Total Score Depression Severity:  1-4 = Minimal depression, 5-9 = Mild depression, 10-14 = Moderate depression, 15-19 = Moderately severe depression, 20-27 = Severe depression   Psychosocial Evaluation and Intervention:  Psychosocial Evaluation - 10/17/20 1455      Psychosocial Evaluation & Interventions   Interventions Encouraged to exercise with the program and follow exercise prescription    Comments Patient has no identifiable psychosocial concerns at orientation.    Expected Outcomes Patient will continue to not have any identifiable psychosocial concerns throughout his time is the program.            Psychosocial Re-Evaluation:   Psychosocial Discharge (Final Psychosocial Re-Evaluation):    Education: Education Goals: Education classes will be provided on a weekly basis, covering required topics. Participant will state understanding/return demonstration of topics presented.  Learning Barriers/Preferences:  Learning Barriers/Preferences - 10/17/20 1320      Learning Barriers/Preferences   Learning Barriers None    Learning Preferences Pictoral;Skilled Demonstration           Education Topics: How Lungs Work and Diseases: - Discuss the anatomy of the lungs and diseases that can affect the lungs, such as COPD.   Exercise: -Discuss the importance of exercise, FITT principles of exercise, normal and abnormal responses to exercise, and how to exercise safely.   Environmental Irritants: -Discuss types of environmental irritants and how to limit exposure to environmental irritants.   Meds/Inhalers and oxygen: - Discuss respiratory medications, definition of an inhaler and oxygen, and the proper way to use an inhaler and oxygen.  Energy Saving Techniques: - Discuss methods to conserve energy and decrease shortness of breath when performing activities of daily living.    Bronchial Hygiene / Breathing Techniques: - Discuss breathing mechanics, pursed-lip breathing technique,  proper posture, effective ways to clear airways, and other functional breathing techniques   Cleaning Equipment: - Provides group verbal and written instruction about the health risks of elevated stress, cause of high stress, and healthy ways to reduce stress.   Nutrition I: Fats: - Discuss the types of cholesterol, what cholesterol does to the body, and how cholesterol levels can be controlled.   Nutrition II: Labels: -Discuss the different components of food labels and how to read food labels.   Respiratory Infections: - Discuss the signs and symptoms of respiratory infections,  ways to prevent respiratory infections, and the importance of seeking medical treatment when having a respiratory infection.   Stress I: Signs and Symptoms: - Discuss the causes of stress, how stress may lead to anxiety and depression, and ways to limit stress.   Stress II: Relaxation: -Discuss relaxation techniques to limit stress.   Oxygen for Home/Travel: - Discuss how to prepare for travel when on oxygen and proper ways to transport and store oxygen to ensure safety.   Knowledge Questionnaire Score:  Knowledge Questionnaire Score - 10/17/20 1326      Knowledge Questionnaire Score   Pre Score 14/18           Core Components/Risk Factors/Patient Goals at Admission:  Personal Goals and Risk Factors at Admission - 10/17/20 1321      Core Components/Risk Factors/Patient Goals on Admission    Weight Management Yes;Weight Loss    Intervention Weight Management: Develop a combined nutrition and exercise program designed to reach desired caloric intake, while maintaining appropriate intake of nutrient and fiber, sodium and fats, and appropriate energy expenditure required for the weight goal.;Weight Management: Provide education and appropriate resources to help participant work on and attain dietary goals.;Weight Management/Obesity: Establish reasonable short term and long term weight goals.;Obesity: Provide education and appropriate resources to help participant work on and attain dietary goals.    Expected Outcomes Short Term: Continue to assess and modify interventions until short term weight is achieved;Long Term: Adherence to nutrition and physical activity/exercise program aimed toward attainment of established weight goal;Weight Maintenance: Understanding of the daily nutrition guidelines, which includes 25-35% calories from fat, 7% or less cal from saturated fats, less than 200mg  cholesterol, less than 1.5gm of sodium, & 5 or more servings of fruits and vegetables daily;Weight  Loss: Understanding of general recommendations for a balanced deficit meal plan, which promotes 1-2 lb weight loss per week and includes a negative energy balance of [phone] kcal/d;Understanding of distribution of calorie intake throughout the day with the consumption of 4-5 meals/snacks;Understanding recommendations for meals to include 15-35% energy as protein, 25-35% energy from fat, 35-60% energy from carbohydrates, less than 200mg  of dietary cholesterol, 20-35 gm of total fiber daily;Weight Gain: Understanding of general recommendations for a high calorie, high protein meal plan that promotes weight gain by distributing calorie intake throughout the day with the consumption for 4-5 meals, snacks, and/or supplements    Tobacco Cessation Yes    Number of packs per day 1/2    Intervention Assist the participant in steps to quit. Provide individualized education and counseling about committing to Tobacco Cessation, relapse prevention, and pharmacological support that can be provided by physician.;Advice worker, assist with locating and accessing local/national Quit Smoking programs, and support quit date  choice.    Expected Outcomes Short Term: Will quit all tobacco product use, adhering to prevention of relapse plan.;Long Term: Complete abstinence from all tobacco products for at least 12 months from quit date.;Short Term: Will demonstrate readiness to quit, by selecting a quit date.    Improve shortness of breath with ADL's Yes    Intervention Provide education, individualized exercise plan and daily activity instruction to help decrease symptoms of SOB with activities of daily living.    Expected Outcomes Short Term: Improve cardiorespiratory fitness to achieve a reduction of symptoms when performing ADLs;Long Term: Be able to perform more ADLs without symptoms or delay the onset of symptoms           Core Components/Risk Factors/Patient Goals Review:    Core Components/Risk  Factors/Patient Goals at Discharge (Final Review):    ITP Comments:   Comments: Patient arrived for 1st visit/orientation/education at 1230. Patient was referred to PR by Dr. Rosaria Ferries ([place]) due to COPD, unspecified type (J44.9). During orientation advised patient on arrival and appointment times what to wear, what to do before, during and after exercise. Reviewed attendance and class policy.  Pt is scheduled to return Pulmonary Rehab on 10/20/2020 at 1330. Pt was advised to come to class 15 minutes before class starts.  Discussed RPE/Dpysnea scales. Patient participated in warm up stretches. Patient was able to complete 6 minute walk test. Patient was measured for the equipment. Discussed equipment safety with patient. Took patient pre-anthropometric measurements. Patient finished visit at 1430.

## 2020-10-20 ENCOUNTER — Encounter (HOSPITAL_COMMUNITY)
Admission: RE | Admit: 2020-10-20 | Discharge: 2020-10-20 | Disposition: A | Discharge status: Home / Self Care | Payer: No Typology Code available for payment source | Source: Ambulatory Visit | Attending: Critical Care Medicine | Admitting: Critical Care Medicine

## 2020-10-20 ENCOUNTER — Other Ambulatory Visit: Payer: Self-pay

## 2020-10-20 DIAGNOSIS — J449 Chronic obstructive pulmonary disease, unspecified: Secondary | ICD-10-CM | POA: Diagnosis not present

## 2020-10-20 NOTE — Progress Notes (Signed)
Daily Session Note  Patient Details  Name: Glen Cobb MRN: [id number] Date of Birth: 02/05/1956 Referring Provider:   Flowsheet Row PULMONARY REHAB COPD ORIENTATION from 10/17/2020 in [place]  Referring Provider Dr. Rosaria Ferries      Encounter Date: 10/20/2020  Check In:  Session Check In - 10/20/20 1330      Check-In   Supervising physician immediately available to respond to emergencies [facility] MD immediately available    Physician(s) Dr. Harl Bowie    Location [facility]    Staff Present Geanie Cooley, RN;Anayi Bricco Wynetta Emery, RN, BSN;Madison Audria Nine, MS, Exercise Physiologist    Virtual Visit No    Medication changes reported     No    Fall or balance concerns reported    No    Tobacco Cessation No Change    Warm-up and Cool-down Not performed (comment)    Resistance Training Performed Yes    VAD Patient? No    PAD/SET Patient? No      Pain Assessment   Currently in Pain? Yes    Pain Score 4     Pain Location Back    Pain Orientation Lower    Pain Descriptors / Indicators Constant;Sharp;Stabbing    Pain Type Chronic pain    Pain Onset More than a month ago    Pain Frequency Constant    Aggravating Factors  Standing,sitting too long.    Pain Relieving Factors Cortisone injections.    Multiple Pain Sites Yes      2nd Pain Site   Pain Score 4    Pain Location Neck    Pain Descriptors / Indicators Stabbing;Sharp    Pain Type Chronic pain    Pain Onset More than a month ago    Pain Frequency Constant    Aggravating Factors  Certain movements.    Pain Relieving Factors Cortisone injections routinely.    Effect of Pain on Daily Activities Limits activities at times.           Capillary Blood Glucose: No results found for this or any previous visit (from the past 24 hour(s)).    Social History   Tobacco Use  Smoking Status Current Every Day Smoker  . Packs/day: 0.25  . Years: 46.00  . Pack years: 11.50  . Types:  Cigarettes  Smokeless Tobacco Former Systems developer  . Types: Snuff  Tobacco Comment   using a nicotine patch, trying to quit     Goals Met:  Proper associated with RPD/PD & O2 Sat Independence with exercise equipment Improved SOB with ADL's Using PLB without cueing & demonstrates good technique Personal goals reviewed No report of cardiac concerns or symptoms Strength training completed today  Goals Unmet:  Not Applicable  Comments: Check out 1430.   Dr. Kathie Dike is Medical Director for [facility].

## 2020-10-25 ENCOUNTER — Other Ambulatory Visit: Payer: Self-pay

## 2020-10-25 ENCOUNTER — Encounter (HOSPITAL_COMMUNITY)
Admission: RE | Admit: 2020-10-25 | Discharge: 2020-10-25 | Disposition: A | Discharge status: Home / Self Care | Payer: No Typology Code available for payment source | Source: Ambulatory Visit | Attending: Critical Care Medicine | Admitting: Critical Care Medicine

## 2020-10-25 DIAGNOSIS — J449 Chronic obstructive pulmonary disease, unspecified: Secondary | ICD-10-CM | POA: Diagnosis present

## 2020-10-25 NOTE — Progress Notes (Signed)
Daily Session Note  Patient Details  Name: KETAN RENZ MRN: [id number] Date of Birth: 10-22-1955 Referring Provider:   Flowsheet Row PULMONARY REHAB COPD ORIENTATION from 10/17/2020 in [place]  Referring Provider Dr. Rosaria Ferries      Encounter Date: 10/25/2020  Check In:  Session Check In - 10/25/20 1330      Check-In   Supervising physician immediately available to respond to emergencies [facility] immediately available    Physician(s) Dr. Domenic Polite    Location [facility]    Staff Present Cathren Harsh, MS, Exercise Physiologist;Phyllis Billingsley, RN    Virtual Visit No    Medication changes reported     No    Fall or balance concerns reported    No    Tobacco Cessation No Change    Warm-up and Cool-down Performed as group-led instruction    Resistance Training Performed Yes    VAD Patient? No    PAD/SET Patient? No      Pain Assessment   Currently in Pain? No/denies    Pain Score 2     Pain Location Back    Pain Orientation Lower    Multiple Pain Sites No           Capillary Blood Glucose: No results found for this or any previous visit (from the past 24 hour(s)).    Social History   Tobacco Use  Smoking Status Current Every Day Smoker  . Packs/day: 0.25  . Years: 46.00  . Pack years: 11.50  . Types: Cigarettes  Smokeless Tobacco Former Systems developer  . Types: Snuff  Tobacco Comment   using a nicotine patch, trying to quit     Goals Met:  Independence with exercise equipment Exercise tolerated well No report of cardiac concerns or symptoms Strength training completed today  Goals Unmet:  Not Applicable  Comments: check out 1430   Dr. Kathie Dike is Medical Director for [facility].

## 2020-10-27 ENCOUNTER — Other Ambulatory Visit: Payer: Self-pay

## 2020-10-27 ENCOUNTER — Encounter (HOSPITAL_COMMUNITY)
Admission: RE | Admit: 2020-10-27 | Discharge: 2020-10-27 | Disposition: A | Discharge status: Home / Self Care | Payer: No Typology Code available for payment source | Source: Ambulatory Visit | Attending: Critical Care Medicine | Admitting: Critical Care Medicine

## 2020-10-27 DIAGNOSIS — J449 Chronic obstructive pulmonary disease, unspecified: Secondary | ICD-10-CM

## 2020-10-27 NOTE — Progress Notes (Signed)
Daily Session Note  Patient Details  Name: Glen Cobb MRN: [id number] Date of Birth: 30-Jun-1956 Referring Provider:   Flowsheet Row PULMONARY REHAB COPD ORIENTATION from 10/17/2020 in [place]  Referring Provider Dr. Rosaria Ferries      Encounter Date: 10/27/2020  Check In:  Session Check In - 10/27/20 1330      Check-In   Supervising physician immediately available to respond to emergencies [facility] immediately available    Physician(s) Dr. Domenic Polite    Location [facility]    Staff Present Geanie Cooley, RN;Debra Wynetta Emery, RN, BSN;Madison Audria Nine, MS, Exercise Physiologist;Dalton Kris Mouton, MS, ACSM-CEP, Exercise Physiologist    Virtual Visit No    Medication changes reported     No    Fall or balance concerns reported    No    Tobacco Cessation No Change    Warm-up and Cool-down Performed as group-led instruction    Resistance Training Performed Yes    VAD Patient? No    PAD/SET Patient? No      Pain Assessment   Currently in Pain? No/denies    Pain Score 0-No pain    Pain Location Teeth    Pain Orientation Upper    Pain Descriptors / Indicators Throbbing    Pain Type Chronic pain    Pain Onset In the past 7 days    Pain Frequency Constant    Multiple Pain Sites No           Capillary Blood Glucose: No results found for this or any previous visit (from the past 24 hour(s)).    Social History   Tobacco Use  Smoking Status Current Every Day Smoker  . Packs/day: 0.25  . Years: 46.00  . Pack years: 11.50  . Types: Cigarettes  Smokeless Tobacco Former Systems developer  . Types: Snuff  Tobacco Comment   using a nicotine patch, trying to quit     Goals Met:  Proper associated with RPD/PD & O2 Sat Independence with exercise equipment Using PLB without cueing & demonstrates good technique Exercise tolerated well No report of cardiac concerns or symptoms Strength training completed today  Goals Unmet:  Not  Applicable  Comments: check out @ 2:30   Dr. Kathie Dike is Medical Director for [facility] Pul[facility] Re[facility].

## 2020-11-01 ENCOUNTER — Other Ambulatory Visit: Payer: Self-pay

## 2020-11-01 ENCOUNTER — Encounter (HOSPITAL_COMMUNITY)
Admission: RE | Admit: 2020-11-01 | Discharge: 2020-11-01 | Disposition: A | Discharge status: Home / Self Care | Payer: No Typology Code available for payment source | Source: Ambulatory Visit | Attending: Critical Care Medicine | Admitting: Critical Care Medicine

## 2020-11-01 VITALS — Wt 175.0 lb

## 2020-11-01 DIAGNOSIS — J449 Chronic obstructive pulmonary disease, unspecified: Secondary | ICD-10-CM | POA: Diagnosis not present

## 2020-11-01 NOTE — Progress Notes (Signed)
Daily Session Note  Patient Details  Name: Glen Cobb MRN: [id number] Date of Birth: [birth year] Referring Provider:   Flowsheet Row PULMONARY REHAB COPD ORIENTATION from 10/17/2020 in [place]  Referring Provider Dr. Rosaria Ferries      Encounter Date: 11/01/2020  Check In:  Session Check In - 11/01/20 1330      Check-In   Supervising physician immediately available to respond to emergencies [facility] immediately available    Physician(s) Dr. Harl Bowie    Location [facility]    Staff Present Geanie Cooley, RN;Madison Audria Nine, MS, Exercise Physiologist;Dalton Kris Mouton, MS, ACSM-CEP, Exercise Physiologist    Virtual Visit No    Medication changes reported     No    Fall or balance concerns reported    No    Tobacco Cessation No Change    Warm-up and Cool-down Performed as group-led instruction    Resistance Training Performed Yes    VAD Patient? No    PAD/SET Patient? No      Pain Assessment   Currently in Pain? No/denies    Pain Score 0-No pain    Multiple Pain Sites No           Capillary Blood Glucose: No results found for this or any previous visit (from the past 24 hour(s)).    Social History   Tobacco Use  Smoking Status Current Every Day Smoker  . Packs/day: 0.25  . Years: 46.00  . Pack years: 11.50  . Types: Cigarettes  Smokeless Tobacco Former Systems developer  . Types: Snuff  Tobacco Comment   using a nicotine patch, trying to quit     Goals Met:  Proper associated with RPD/PD & O2 Sat Independence with exercise equipment Improved SOB with ADL's Exercise tolerated well No report of cardiac concerns or symptoms Strength training completed today  Goals Unmet:  Not Applicable  Comments: check out @ 2:30 pm   Dr. Kathie Dike is Medical Director for [facility].

## 2020-11-03 ENCOUNTER — Encounter (HOSPITAL_COMMUNITY): Payer: No Typology Code available for payment source

## 2020-11-03 ENCOUNTER — Other Ambulatory Visit: Payer: Self-pay

## 2020-11-03 ENCOUNTER — Encounter (HOSPITAL_COMMUNITY)
Admission: RE | Admit: 2020-11-03 | Discharge: 2020-11-03 | Disposition: A | Discharge status: Home / Self Care | Payer: No Typology Code available for payment source | Source: Ambulatory Visit | Attending: Critical Care Medicine | Admitting: Critical Care Medicine

## 2020-11-03 DIAGNOSIS — J449 Chronic obstructive pulmonary disease, unspecified: Secondary | ICD-10-CM

## 2020-11-03 NOTE — Progress Notes (Signed)
Daily Session Note  Patient Details  Name: Glen Cobb MRN: [id number] Date of Birth: [birth year] Referring Provider:   Flowsheet Row PULMONARY REHAB COPD ORIENTATION from 10/17/2020 in [place]  Referring Provider Dr. Rosaria Ferries      Encounter Date: 11/03/2020  Check In:  Session Check In - 11/03/20 1323      Check-In   Supervising physician immediately available to respond to emergencies [facility] MD immediately available    Physician(s) Dr. Harl Bowie    Location [facility]    Staff Present Cathren Harsh, MS, Exercise Physiologist;Adil Tugwell Wynetta Emery, RN, BSN;Phyllis Billingsley, RN    Virtual Visit No    Medication changes reported     No    Fall or balance concerns reported    No    Tobacco Cessation No Change    Warm-up and Cool-down Performed as group-led instruction    Resistance Training Performed Yes    VAD Patient? No    PAD/SET Patient? No      Pain Assessment   Currently in Pain? No/denies    Pain Score 0-No pain    Multiple Pain Sites No           Capillary Blood Glucose: No results found for this or any previous visit (from the past 24 hour(s)).    Social History   Tobacco Use  Smoking Status Current Every Day Smoker  . Packs/day: 0.25  . Years: 46.00  . Pack years: 11.50  . Types: Cigarettes  Smokeless Tobacco Former Systems developer  . Types: Snuff  Tobacco Comment   using a nicotine patch, trying to quit     Goals Met:  Proper associated with RPD/PD & O2 Sat Independence with exercise equipment Improved SOB with ADL's Using PLB without cueing & demonstrates good technique Exercise tolerated well No report of cardiac concerns or symptoms Strength training completed today  Goals Unmet:  Not Applicable  Comments: Check out 1430   Dr. Kathie Dike is Medical Director for [facility] Pulmonary Rehab.

## 2020-11-03 NOTE — Progress Notes (Signed)
Pulmonary Individual Treatment Plan  Patient Details  Name: Glen Cobb MRN: [id number] Date of Birth: 1955-10-17 Referring Provider:   Flowsheet Row PULMONARY REHAB COPD ORIENTATION from 10/17/2020 in [place]  Referring Provider Dr. Rosaria Ferries      Initial Encounter Date:  Flowsheet Row PULMONARY REHAB COPD ORIENTATION from 10/17/2020 in [place]  Date 10/17/20      Visit Diagnosis: Chronic obstructive pulmonary disease, unspecified COPD type ([place])  Patient's Home Medications on Admission:   Current Outpatient Medications:  .  Acidophilus Lactobacillus CAPS, Take 2 tablets by mouth daily at 12 noon., Disp: , Rfl:  .  acyclovir (ZOVIRAX) 400 MG tablet, Take 400 mg by mouth 2 (two) times daily as needed (for flareup)., Disp: , Rfl:  .  albuterol-ipratropium (COMBIVENT) 18-103 MCG/ACT inhaler, Inhale 1 puff into the lungs every 6 (six) hours as needed., Disp: , Rfl:  .  allopurinol (ZYLOPRIM) 100 MG tablet, Take 100 mg by mouth daily., Disp: , Rfl:  .  amLODipine (NORVASC) 5 MG tablet, Take 10 mg by mouth daily., Disp: , Rfl:  .  budesonide-formoterol (SYMBICORT) 160-4.5 MCG/ACT inhaler, Inhale 2 puffs into the lungs 2 (two) times daily., Disp: , Rfl:  .  cyclobenzaprine (FLEXERIL) 10 MG tablet, Take 15 mg by mouth at bedtime., Disp: , Rfl:  .  gabapentin (NEURONTIN) 600 MG tablet, Take 1,200 mg by mouth 3 (three) times daily., Disp: , Rfl:  .  hydrOXYzine (VISTARIL) 25 MG capsule, Take 25 mg by mouth every 6 (six) hours as needed for itching (sleep)., Disp: , Rfl:  .  lidocaine (LIDODERM) 5 %, Place 1 patch onto the skin daily. Remove & Discard patch within 12 hours or as directed by MD, Disp: , Rfl:  .  omeprazole (PRILOSEC) 40 MG capsule, Take 40 mg by mouth daily., Disp: , Rfl:  .  potassium chloride (KLOR-CON) 10 MEQ tablet, Take 10 mEq by mouth daily., Disp: , Rfl:  .  predniSONE (DELTASONE) 20 MG tablet, Take 3 tablets by mouth  daily x1 day; then 2 tablets by mouth daily x2 days; then 1 tablet by mouth daily x3 days; then half tablet by mouth daily x3 days and stop prednisone. (Patient taking differently: Take 3 tablets by mouth daily x1 day; then 2 tablets by mouth daily x2 days; then 1 tablet by mouth daily x3 days; then half tablet by mouth daily x3 days and stop prednisone. As needed for gout flareup), Disp: 30 tablet, Rfl: 0 .  Propylene Glycol (SYSTANE BALANCE OP), Apply 1 drop to eye 4 (four) times daily. Both eyes, Disp: , Rfl:  .  Riboflavin 100 MG TABS, Take 400 mg by mouth daily., Disp: , Rfl:  .  sildenafil (VIAGRA) 100 MG tablet, Take 50 mg by mouth daily as needed for erectile dysfunction., Disp: , Rfl:  .  Tiotropium Bromide Monohydrate (SPIRIVA RESPIMAT) 2.5 MCG/ACT AERS, Inhale 2 puffs into the lungs daily., Disp: , Rfl:  .  traMADol (ULTRAM) 50 MG tablet, Take 100 mg by mouth 3 (three) times daily as needed for moderate pain., Disp: , Rfl:   Past Medical History: Past Medical History:  Diagnosis Date  . Arthritis   . Cancer ([place])    bladder cancer x 2  . Cataract    MD just watching  . COPD (chronic obstructive pulmonary disease) ([place])   . Emphysema of lung ([place])   . FH: migraines    last one 1 yr ago - OTC  med prn  . Gout   . HSV infection   . Hyperlipidemia    no med. diet  . Hypertension   . Kidney stone    passed stone - no surgery  . Osteoporosis   . Sleep apnea    Does not use CPAP nightly.    Tobacco Use: Social History   Tobacco Use  Smoking Status Current Every Day Smoker  . Packs/day: 0.25  . Years: 46.00  . Pack years: 11.50  . Types: Cigarettes  Smokeless Tobacco Former Systems developer  . Types: Snuff  Tobacco Comment   using a nicotine patch, trying to quit     Labs: Recent Review Flowsheet Data   There is no flowsheet data to display.     Capillary Blood Glucose: Lab Results  Component Value Date   GLUCAP 191 (H) 08/22/2019   GLUCAP 145 (H) 08/20/2019   GLUCAP  182 (H) 08/19/2019     Pulmonary Assessment Scores:  Pulmonary Assessment Scores    Row Name 10/17/20 1329         ADL UCSD   ADL Phase Entry     SOB Score total 24           CAT Score   CAT Score 22           mMRC Score   mMRC Score 2           UCSD: Self-administered rating of dyspnea associated with activities of daily living (ADLs) 6-point scale (0 = "not at all" to 5 = "maximal or unable to do because of breathlessness")  Scoring Scores range from 0 to 120.  Minimally important difference is 5 units  CAT: CAT can identify the health impairment of COPD patients and is better correlated with disease progression.  CAT has a scoring range of zero to 40. The CAT score is classified into four groups of low (less than 10), medium (10 - 20), high (21-30) and very high (31-40) based on the impact level of disease on health status. A CAT score over 10 suggests significant symptoms.  A worsening CAT score could be explained by an exacerbation, poor medication adherence, poor inhaler technique, or progression of COPD or comorbid conditions.  CAT MCID is 2 points  mMRC: mMRC (Modified Medical Research Council) Dyspnea Scale is used to assess the degree of baseline functional disability in patients of respiratory disease due to dyspnea. No minimal important difference is established. A decrease in score of 1 point or greater is considered a positive change.   Pulmonary Function Assessment:  Pulmonary Function Assessment - 10/17/20 1325      Breath   Shortness of Breath Yes;Limiting activity;Fear of Shortness of Breath           Exercise Target Goals: Exercise Program Goal: Individual exercise prescription set using results from initial 6 min walk test and THRR while considering  patient's activity barriers and safety.   Exercise Prescription Goal: Initial exercise prescription builds to 30-45 minutes a day of aerobic activity, 2-3 days per week.  Home exercise guidelines  will be given to patient during program as part of exercise prescription that the participant will acknowledge.  Activity Barriers & Risk Stratification:  Activity Barriers & Cardiac Risk Stratification - 10/17/20 1322      Activity Barriers & Cardiac Risk Stratification   Activity Barriers Arthritis;Back Problems;Neck/Spine Problems;Joint Problems;Deconditioning;Muscular Weakness;Shortness of Breath;Balance Concerns;History of Falls    Cardiac Risk Stratification Moderate  6 Minute Walk:  6 Minute Walk    Row Name 10/17/20 1459         6 Minute Walk   Phase Initial     Distance 1100 feet     Walk Time 6 minutes     # of Rest Breaks 0     MPH 2.08     METS 3.14     RPE 9     Perceived Dyspnea  11     VO2 Peak 10.99     Symptoms Yes (comment)     Comments lower back pain 5/10, left foot pain 3/10, right hip pain 3/10     Resting HR 77 bpm     Resting BP 132/78     Resting Oxygen Saturation  94 %     Exercise Oxygen Saturation  during 6 min walk 88 %     Max Ex. HR 94 bpm     Max Ex. BP 142/78     2 Minute Post BP 128/76           Interval HR   1 Minute HR 92     2 Minute HR 91     3 Minute HR 92     4 Minute HR 92     5 Minute HR 94     6 Minute HR 90     2 Minute Post HR 87     Interval Heart Rate? Yes           Interval Oxygen   Interval Oxygen? Yes     Baseline Oxygen Saturation % 94 %     1 Minute Oxygen Saturation % 92 %     1 Minute Liters of Oxygen 0 L     2 Minute Oxygen Saturation % 92 %     2 Minute Liters of Oxygen 0 L     3 Minute Oxygen Saturation % 91 %     3 Minute Liters of Oxygen 0 L     4 Minute Oxygen Saturation % 91 %     4 Minute Liters of Oxygen 0 L     5 Minute Oxygen Saturation % 89 %     5 Minute Liters of Oxygen 0 L     6 Minute Oxygen Saturation % 88 %     6 Minute Liters of Oxygen 0 L     2 Minute Post Oxygen Saturation % 97 %     2 Minute Post Liters of Oxygen 0 L            Oxygen Initial Assessment:   Oxygen Initial Assessment - 10/17/20 1458      Initial 6 min Walk   Oxygen Used None      Program Oxygen Prescription   Program Oxygen Prescription None      Intervention   Short Term Goals To learn and exhibit compliance with exercise, home and travel O2 prescription;To learn and understand importance of monitoring SPO2 with pulse oximeter and demonstrate accurate use of the pulse oximeter.;To learn and understand importance of maintaining oxygen saturations>88%;To learn and demonstrate proper pursed lip breathing techniques or other breathing techniques.;To learn and demonstrate proper use of respiratory medications    Long  Term Goals Exhibits compliance with exercise, home and travel O2 prescription;Verbalizes importance of monitoring SPO2 with pulse oximeter and return demonstration;Maintenance of O2 saturations>88%;Exhibits proper breathing techniques, such as pursed lip breathing or other method taught during program session;Compliance with respiratory medication;Demonstrates proper  use of MDI's           Oxygen Re-Evaluation:  Oxygen Re-Evaluation    Row Name 11/01/20 0747             Program Oxygen Prescription   Program Oxygen Prescription None               Home Oxygen   Home Oxygen Device None       Sleep Oxygen Prescription None       Home Exercise Oxygen Prescription None       Home Resting Oxygen Prescription None       Compliance with Home Oxygen Use Yes               Goals/Expected Outcomes   Short Term Goals To learn and exhibit compliance with exercise, home and travel O2 prescription;To learn and understand importance of monitoring SPO2 with pulse oximeter and demonstrate accurate use of the pulse oximeter.;To learn and understand importance of maintaining oxygen saturations>88%;To learn and demonstrate proper pursed lip breathing techniques or other breathing techniques.;To learn and demonstrate proper use of respiratory medications       Long  Term Goals  Exhibits compliance with exercise, home and travel O2 prescription;Verbalizes importance of monitoring SPO2 with pulse oximeter and return demonstration;Maintenance of O2 saturations>88%;Exhibits proper breathing techniques, such as pursed lip breathing or other method taught during program session;Compliance with respiratory medication;Demonstrates proper use of MDI's       Goals/Expected Outcomes Compliance              Oxygen Discharge (Final Oxygen Re-Evaluation):  Oxygen Re-Evaluation - 11/01/20 0747      Program Oxygen Prescription   Program Oxygen Prescription None      Home Oxygen   Home Oxygen Device None    Sleep Oxygen Prescription None    Home Exercise Oxygen Prescription None    Home Resting Oxygen Prescription None    Compliance with Home Oxygen Use Yes      Goals/Expected Outcomes   Short Term Goals To learn and exhibit compliance with exercise, home and travel O2 prescription;To learn and understand importance of monitoring SPO2 with pulse oximeter and demonstrate accurate use of the pulse oximeter.;To learn and understand importance of maintaining oxygen saturations>88%;To learn and demonstrate proper pursed lip breathing techniques or other breathing techniques.;To learn and demonstrate proper use of respiratory medications    Long  Term Goals Exhibits compliance with exercise, home and travel O2 prescription;Verbalizes importance of monitoring SPO2 with pulse oximeter and return demonstration;Maintenance of O2 saturations>88%;Exhibits proper breathing techniques, such as pursed lip breathing or other method taught during program session;Compliance with respiratory medication;Demonstrates proper use of MDI's    Goals/Expected Outcomes Compliance           Initial Exercise Prescription:  Initial Exercise Prescription - 10/17/20 1500      Date of Initial Exercise RX and Referring Provider   Date 10/17/20    Referring Provider Dr. Rosaria Ferries    Expected Discharge Date  01/02/21   This is his current [place]. date. We have requested it be extended.     NuStep   Level 1    SPM 60    Minutes 39      Prescription Details   Frequency (times per week) 2    Duration Progress to 30 minutes of continuous aerobic without signs/symptoms of physical distress      Intensity   THRR 40-80% of Max Heartrate 63-126    Ratings of  Perceived Exertion 11-13    Perceived Dyspnea 0-4      Progression   Progression Continue progressive overload as per policy without signs/symptoms or physical distress.      Resistance Training   Training Prescription Yes    Weight 3 lbs    Reps 10-15           Perform Capillary Blood Glucose checks as needed.  Exercise Prescription Changes:   Exercise Prescription Changes    Row Name 11/02/20 0700             Response to Exercise   Blood Pressure (Admit) 142/80       Blood Pressure (Exercise) 160/78       Blood Pressure (Exit) 144/82       Heart Rate (Admit) 83 bpm       Heart Rate (Exercise) 93 bpm       Heart Rate (Exit) 88 bpm       Oxygen Saturation (Admit) 97 %       Oxygen Saturation (Exercise) 95 %       Oxygen Saturation (Exit) 95 %       Rating of Perceived Exertion (Exercise) 11       Perceived Dyspnea (Exercise) 12       Duration Continue with 30 min of aerobic exercise without signs/symptoms of physical distress.       Intensity THRR unchanged               Progression   Progression Continue to progress workloads to maintain intensity without signs/symptoms of physical distress.               Resistance Training   Training Prescription Yes       Weight 3 lbs       Reps 10-15       Time 10 Minutes               NuStep   Level 2       SPM 109       Minutes 39       METs 2.2              Exercise Comments:   Exercise Comments    Row Name 10/20/20 1441           Exercise Comments Patient completed first exercise session today. He tolerated exercise well and was setting goals for  himself to complete 1 mile on each station. He is excited to come back to rehab and continue the program.              Exercise Goals and Review:   Exercise Goals    Row Name 10/17/20 1506 11/02/20 0744           Exercise Goals   Increase Physical Activity Yes Yes      Intervention Provide advice, education, support and counseling about physical activity/exercise needs.;Develop an individualized exercise prescription for aerobic and resistive training based on initial evaluation findings, risk stratification, comorbidities and participant's personal goals. Provide advice, education, support and counseling about physical activity/exercise needs.;Develop an individualized exercise prescription for aerobic and resistive training based on initial evaluation findings, risk stratification, comorbidities and participant's personal goals.      Expected Outcomes Short Term: Attend rehab on a regular basis to increase amount of physical activity.;Long Term: Add in home exercise to make exercise part of routine and to increase amount of physical activity.;Long Term: Exercising regularly at least 3-5 days a  week. Short Term: Attend rehab on a regular basis to increase amount of physical activity.;Long Term: Add in home exercise to make exercise part of routine and to increase amount of physical activity.;Long Term: Exercising regularly at least 3-5 days a week.      Increase Strength and Stamina Yes Yes      Intervention Provide advice, education, support and counseling about physical activity/exercise needs.;Develop an individualized exercise prescription for aerobic and resistive training based on initial evaluation findings, risk stratification, comorbidities and participant's personal goals. Provide advice, education, support and counseling about physical activity/exercise needs.;Develop an individualized exercise prescription for aerobic and resistive training based on initial evaluation findings, risk  stratification, comorbidities and participant's personal goals.      Expected Outcomes Short Term: Increase workloads from initial exercise prescription for resistance, speed, and METs.;Short Term: Perform resistance training exercises routinely during rehab and add in resistance training at home;Long Term: Improve cardiorespiratory fitness, muscular endurance and strength as measured by increased METs and functional capacity (6MWT) Short Term: Increase workloads from initial exercise prescription for resistance, speed, and METs.;Short Term: Perform resistance training exercises routinely during rehab and add in resistance training at home;Long Term: Improve cardiorespiratory fitness, muscular endurance and strength as measured by increased METs and functional capacity (6MWT)      Able to understand and use rate of perceived exertion (RPE) scale Yes Yes      Intervention Provide education and explanation on how to use RPE scale Provide education and explanation on how to use RPE scale      Expected Outcomes Short Term: Able to use RPE daily in rehab to express subjective intensity level;Long Term:  Able to use RPE to guide intensity level when exercising independently Short Term: Able to use RPE daily in rehab to express subjective intensity level;Long Term:  Able to use RPE to guide intensity level when exercising independently      Able to understand and use Dyspnea scale Yes Yes      Intervention Provide education and explanation on how to use Dyspnea scale Provide education and explanation on how to use Dyspnea scale      Expected Outcomes Short Term: Able to use Dyspnea scale daily in rehab to express subjective sense of shortness of breath during exertion;Long Term: Able to use Dyspnea scale to guide intensity level when exercising independently Short Term: Able to use Dyspnea scale daily in rehab to express subjective sense of shortness of breath during exertion;Long Term: Able to use Dyspnea scale to  guide intensity level when exercising independently      Knowledge and understanding of Target Heart Rate Range (THRR) Yes Yes      Intervention Provide education and explanation of THRR including how the numbers were predicted and where they are located for reference Provide education and explanation of THRR including how the numbers were predicted and where they are located for reference      Expected Outcomes Short Term: Able to state/look up THRR;Long Term: Able to use THRR to govern intensity when exercising independently;Short Term: Able to use daily as guideline for intensity in rehab Short Term: Able to state/look up THRR;Long Term: Able to use THRR to govern intensity when exercising independently;Short Term: Able to use daily as guideline for intensity in rehab      Understanding of Exercise Prescription Yes Yes      Intervention Provide education, explanation, and written materials on patient's individual exercise prescription Provide education, explanation, and written materials on patient's individual  exercise prescription      Expected Outcomes Short Term: Able to explain program exercise prescription;Long Term: Able to explain home exercise prescription to exercise independently Short Term: Able to explain program exercise prescription;Long Term: Able to explain home exercise prescription to exercise independently             Exercise Goals Re-Evaluation :  Exercise Goals Re-Evaluation    Row Name 11/01/20 0744             Exercise Goal Re-Evaluation   Exercise Goals Review Increase Strength and Stamina;Increase Physical Activity;Able to understand and use rate of perceived exertion (RPE) scale;Knowledge and understanding of Target Heart Rate Range (THRR);Able to check pulse independently;Understanding of Exercise Prescription       Comments Patient has completed 4 exercise sessions. He has tolerated exercise well and has settled in to the routine. He keeps setting goals for  himself to complete on the NuStep. He is progressing quickly. He is currently exercising at 2.2 METs on the NuStep. Will continue to monitor and progress as able.       Expected Outcomes Through exercise at rehab and with a home exercise program, patient will achieve their goals.              Discharge Exercise Prescription (Final Exercise Prescription Changes):  Exercise Prescription Changes - 11/02/20 0700      Response to Exercise   Blood Pressure (Admit) 142/80    Blood Pressure (Exercise) 160/78    Blood Pressure (Exit) 144/82    Heart Rate (Admit) 83 bpm    Heart Rate (Exercise) 93 bpm    Heart Rate (Exit) 88 bpm    Oxygen Saturation (Admit) 97 %    Oxygen Saturation (Exercise) 95 %    Oxygen Saturation (Exit) 95 %    Rating of Perceived Exertion (Exercise) 11    Perceived Dyspnea (Exercise) 12    Duration Continue with 30 min of aerobic exercise without signs/symptoms of physical distress.    Intensity THRR unchanged      Progression   Progression Continue to progress workloads to maintain intensity without signs/symptoms of physical distress.      Resistance Training   Training Prescription Yes    Weight 3 lbs    Reps 10-15    Time 10 Minutes      NuStep   Level 2    SPM 109    Minutes 39    METs 2.2           Nutrition:  Target Goals: Understanding of nutrition guidelines, daily intake of sodium 1500mg , cholesterol 200mg , calories 30% from fat and 7% or less from saturated fats, daily to have 5 or more servings of fruits and vegetables.  Biometrics:  Pre Biometrics - 11/01/20 0748      Pre Biometrics   Weight 79.4 kg    BMI (Calculated) 25.84            Nutrition Therapy Plan and Nutrition Goals:  Nutrition Therapy & Goals - 10/28/20 0826      Personal Nutrition Goals   Comments Patient scored 62 on his diet assessment. We will provide heart healthy nutritional education through handouts.      Intervention Plan   Intervention Nutrition  handout(s) given to patient.           Nutrition Assessments:  Nutrition Assessments - 10/17/20 1340      MEDFICTS Scores   Pre Score 62  MEDIFICTS Score Key:  ?70 Need to make dietary changes   40-70 Heart Healthy Diet  ? 40 Therapeutic Level Cholesterol Diet   Picture Your Plate Scores:  <19 Unhealthy dietary pattern with much room for improvement.  41-50 Dietary pattern unlikely to meet recommendations for good health and room for improvement.  51-60 More healthful dietary pattern, with some room for improvement.   >60 Healthy dietary pattern, although there may be some specific behaviors that could be improved.    Nutrition Goals Re-Evaluation:   Nutrition Goals Discharge (Final Nutrition Goals Re-Evaluation):   Psychosocial: Target Goals: Acknowledge presence or absence of significant depression and/or stress, maximize coping skills, provide positive support system. Participant is able to verbalize types and ability to use techniques and skills needed for reducing stress and depression.  Initial Review & Psychosocial Screening:  Initial Psych Review & Screening - 10/17/20 1318      Initial Review   Current issues with Current Sleep Concerns   He has a hard time falling asleep and staying asleep. He cannot sleep more than a couple of hours at a time. He has been tested for OSA, but he has not gotten his results back yet.     Family Dynamics   Good Support System? Yes    Comments His older brother and younger brother support him. He has some friends that he can call and talk to about his problems.      Barriers   Psychosocial barriers to participate in program There are no identifiable barriers or psychosocial needs.      Screening Interventions   Interventions Encouraged to exercise    Expected Outcomes Short Term goal: Utilizing psychosocial counselor, staff and physician to assist with identification of specific Stressors or current issues  interfering with healing process. Setting desired goal for each stressor or current issue identified.;Long Term goal: The participant improves quality of Life and PHQ9 Scores as seen by post scores and/or verbalization of changes           Quality of Life Scores:  Quality of Life - 10/17/20 1456      Quality of Life   Select Quality of Life      Quality of Life Scores   Health/Function Pre 21.19 %    Socioeconomic Pre 26.92 %    Psych/Spiritual Pre 24.29 %    Family Pre 25.5 %    GLOBAL Pre 23.34 %          Scores of 19 and below usually indicate a poorer quality of life in these areas.  A difference of  2-3 points is a clinically meaningful difference.  A difference of 2-3 points in the total score of the Quality of Life Index has been associated with significant improvement in overall quality of life, self-image, physical symptoms, and general health in studies assessing change in quality of life.   PHQ-9: Recent Review Flowsheet Data    Depression screen [facility] 2/9 10/17/2020   Decreased Interest 1   Down, Depressed, Hopeless 0   PHQ - 2 Score 1   Altered sleeping 2   Tired, decreased energy 1   Change in appetite 0   Feeling bad or failure about yourself  0   Trouble concentrating 0   Moving slowly or fidgety/restless 0   Suicidal thoughts 0   PHQ-9 Score 4   Difficult doing work/chores Not difficult at all     Interpretation of Total Score  Total Score Depression Severity:  1-4 =  Minimal depression, 5-9 = Mild depression, 10-14 = Moderate depression, 15-19 = Moderately severe depression, 20-27 = Severe depression   Psychosocial Evaluation and Intervention:  Psychosocial Evaluation - 10/17/20 1455      Psychosocial Evaluation & Interventions   Interventions Encouraged to exercise with the program and follow exercise prescription    Comments Patient has no identifiable psychosocial concerns at orientation.    Expected Outcomes Patient will continue to not have any  identifiable psychosocial concerns throughout his time is the program.           Psychosocial Re-Evaluation:  Psychosocial Re-Evaluation    [place] Name 10/28/20 0827             Psychosocial Re-Evaluation   Current issues with Current Stress Concerns       Comments Patient is new to the program completing 3 sessions. His initial QOL score was 23.34 and his PHQ-9 score was 5. He is using Vistaril 25 mg q6 hours for itching and sleep but he does use it for sleep. He seems to enjoy coming to the program. We will continue to monitor.       Expected Outcomes Patient's sleep with be managed at [facility] with no additional psychosocial issues identifiied.       Interventions Encouraged to attend Pulmonary Rehabilitation for the exercise;Relaxation education;Stress management education       Continue Psychosocial Services  No Follow up required               Initial Review   Source of Stress Concerns None Identified              Psychosocial Discharge (Final Psychosocial Re-Evaluation):  Psychosocial Re-Evaluation - 10/28/20 0827      Psychosocial Re-Evaluation   Current issues with Current Stress Concerns    Comments Patient is new to the program completing 3 sessions. His initial QOL score was 23.34 and his PHQ-9 score was 5. He is using Vistaril 25 mg q6 hours for itching and sleep but he does use it for sleep. He seems to enjoy coming to the program. We will continue to monitor.    Expected Outcomes Patient's sleep with be managed at [facility] with no additional psychosocial issues identifiied.    Interventions Encouraged to attend Pulmonary Rehabilitation for the exercise;Relaxation education;Stress management education    Continue Psychosocial Services  No Follow up required      Initial Review   Source of Stress Concerns None Identified            Education: Education Goals: Education classes will be provided on a weekly basis, covering required topics. Participant will  state understanding/return demonstration of topics presented.  Learning Barriers/Preferences:  Learning Barriers/Preferences - 10/17/20 1320      Learning Barriers/Preferences   Learning Barriers None    Learning Preferences Pictoral;Skilled Demonstration           Education Topics: How Lungs Work and Diseases: - Discuss the anatomy of the lungs and diseases that can affect the lungs, such as COPD.   Exercise: -Discuss the importance of exercise, FITT principles of exercise, normal and abnormal responses to exercise, and how to exercise safely.   Environmental Irritants: -Discuss types of environmental irritants and how to limit exposure to environmental irritants.   Meds/Inhalers and oxygen: - Discuss respiratory medications, definition of an inhaler and oxygen, and the proper way to use an inhaler and oxygen.   Energy Saving Techniques: - Discuss methods to conserve energy and decrease shortness of  breath when performing activities of daily living.  Flowsheet Row PULMONARY REHAB CHRONIC OBSTRUCTIVE PULMONARY DISEASE from 10/27/2020 in [place]  Date 10/20/20  Educator Hand Out  Instruction Review Code 1- Verbalizes Understanding      Bronchial Hygiene / Breathing Techniques: - Discuss breathing mechanics, pursed-lip breathing technique,  proper posture, effective ways to clear airways, and other functional breathing techniques Flowsheet Row PULMONARY REHAB CHRONIC OBSTRUCTIVE PULMONARY DISEASE from 10/27/2020 in [place]  Date 10/27/20  Educator pb  Instruction Review Code 1- Verbalizes Understanding      Cleaning Equipment: - Provides group verbal and written instruction about the health risks of elevated stress, cause of high stress, and healthy ways to reduce stress.   Nutrition I: Fats: - Discuss the types of cholesterol, what cholesterol does to the body, and how cholesterol levels can be  controlled.   Nutrition II: Labels: -Discuss the different components of food labels and how to read food labels.   Respiratory Infections: - Discuss the signs and symptoms of respiratory infections, ways to prevent respiratory infections, and the importance of seeking medical treatment when having a respiratory infection.   Stress I: Signs and Symptoms: - Discuss the causes of stress, how stress may lead to anxiety and depression, and ways to limit stress.   Stress II: Relaxation: -Discuss relaxation techniques to limit stress.   Oxygen for Home/Travel: - Discuss how to prepare for travel when on oxygen and proper ways to transport and store oxygen to ensure safety.   Knowledge Questionnaire Score:  Knowledge Questionnaire Score - 10/17/20 1326      Knowledge Questionnaire Score   Pre Score 14/18           Core Components/Risk Factors/Patient Goals at Admission:  Personal Goals and Risk Factors at Admission - 10/17/20 1321      Core Components/Risk Factors/Patient Goals on Admission    Weight Management Yes;Weight Loss    Intervention Weight Management: Develop a combined nutrition and exercise program designed to reach desired caloric intake, while maintaining appropriate intake of nutrient and fiber, sodium and fats, and appropriate energy expenditure required for the weight goal.;Weight Management: Provide education and appropriate resources to help participant work on and attain dietary goals.;Weight Management/Obesity: Establish reasonable short term and long term weight goals.;Obesity: Provide education and appropriate resources to help participant work on and attain dietary goals.    Expected Outcomes Short Term: Continue to assess and modify interventions until short term weight is achieved;Long Term: Adherence to nutrition and physical activity/exercise program aimed toward attainment of established weight goal;Weight Maintenance: Understanding of the daily nutrition  guidelines, which includes 25-35% calories from fat, 7% or less cal from saturated fats, less than 200mg  cholesterol, less than 1.5gm of sodium, & 5 or more servings of fruits and vegetables daily;Weight Loss: Understanding of general recommendations for a balanced deficit meal plan, which promotes 1-2 lb weight loss per week and includes a negative energy balance of [phone] kcal/d;Understanding of distribution of calorie intake throughout the day with the consumption of 4-5 meals/snacks;Understanding recommendations for meals to include 15-35% energy as protein, 25-35% energy from fat, 35-60% energy from carbohydrates, less than 200mg  of dietary cholesterol, 20-35 gm of total fiber daily;Weight Gain: Understanding of general recommendations for a high calorie, high protein meal plan that promotes weight gain by distributing calorie intake throughout the day with the consumption for 4-5 meals, snacks, and/or supplements    Tobacco Cessation Yes    Number of packs  per day 1/2    Intervention Assist the participant in steps to quit. Provide individualized education and counseling about committing to Tobacco Cessation, relapse prevention, and pharmacological support that can be provided by physician.;Advice worker, assist with locating and accessing local/national Quit Smoking programs, and support quit date choice.    Expected Outcomes Short Term: Will quit all tobacco product use, adhering to prevention of relapse plan.;Long Term: Complete abstinence from all tobacco products for at least 12 months from quit date.;Short Term: Will demonstrate readiness to quit, by selecting a quit date.    Improve shortness of breath with ADL's Yes    Intervention Provide education, individualized exercise plan and daily activity instruction to help decrease symptoms of SOB with activities of daily living.    Expected Outcomes Short Term: Improve cardiorespiratory fitness to achieve a reduction of symptoms  when performing ADLs;Long Term: Be able to perform more ADLs without symptoms or delay the onset of symptoms           Core Components/Risk Factors/Patient Goals Review:   Goals and Risk Factor Review    Row Name 10/28/20 0829             Core Components/Risk Factors/Patient Goals Review   Personal Goals Review Improve shortness of breath with ADL's;Tobacco Cessation;Weight Management/Obesity       Review Patient is new to the program completing 3 sessions. He was referred to pulmonary rehab with COPD. He continues to smoke 1/2 ppd cigerattes. His personal goals for the program are to breathe better and get stronger. We will continue to monitor as he works toward meeting these goals.       Expected Outcomes Patient will complete the program meeting both personal and program goal.s              Core Components/Risk Factors/Patient Goals at Discharge (Final Review):   Goals and Risk Factor Review - 10/28/20 0829      Core Components/Risk Factors/Patient Goals Review   Personal Goals Review Improve shortness of breath with ADL's;Tobacco Cessation;Weight Management/Obesity    Review Patient is new to the program completing 3 sessions. He was referred to pulmonary rehab with COPD. He continues to smoke 1/2 ppd cigerattes. His personal goals for the program are to breathe better and get stronger. We will continue to monitor as he works toward meeting these goals.    Expected Outcomes Patient will complete the program meeting both personal and program goal.s           ITP Comments:   Comments: ITP REVIEW Pt is making expected progress toward pulmonary rehab goals after completing 5 sessions. Recommend continued exercise, life style modification, education, and utilization of breathing techniques to increase stamina and strength and decrease shortness of breath with exertion.

## 2020-11-08 ENCOUNTER — Other Ambulatory Visit: Payer: Self-pay

## 2020-11-08 ENCOUNTER — Encounter (HOSPITAL_COMMUNITY)
Admission: RE | Admit: 2020-11-08 | Discharge: 2020-11-08 | Disposition: A | Discharge status: Home / Self Care | Payer: No Typology Code available for payment source | Source: Ambulatory Visit | Attending: Critical Care Medicine | Admitting: Critical Care Medicine

## 2020-11-08 DIAGNOSIS — J449 Chronic obstructive pulmonary disease, unspecified: Secondary | ICD-10-CM

## 2020-11-08 NOTE — Progress Notes (Signed)
Daily Session Note  Patient Details  Name: Glen Cobb MRN: [id number] Date of Birth: 04/13/1956 Referring Provider:   Flowsheet Row PULMONARY REHAB COPD ORIENTATION from 10/17/2020 in [place] [facility]  Referring Provider Dr. Marion      Encounter Date: 11/08/2020  Check In:  Session Check In - 11/08/20 1330      Check-In   Supervising physician immediately available to respond to emergencies [facility] MD immediately available    Physician(s) Dr. Nishan    Location [facility]    Staff Present Dalton Fletcher, MS, ACSM-CEP, Exercise Physiologist;Madison Karch, MS, Exercise Physiologist;Phyllis Billingsley, RN    Virtual Visit No    Medication changes reported     No    Fall or balance concerns reported    No    Tobacco Cessation No Change    Warm-up and Cool-down Performed as group-led instruction    Resistance Training Performed Yes    VAD Patient? No    PAD/SET Patient? No      Pain Assessment   Currently in Pain? Yes    Pain Score 2     Pain Location Knee    Pain Orientation Left    Pain Descriptors / Indicators Constant    Pain Type Acute pain    Pain Onset In the past 7 days    Pain Frequency Constant    Multiple Pain Sites No           Capillary Blood Glucose: No results found for this or any previous visit (from the past 24 hour(s)).    Social History   Tobacco Use  Smoking Status Current Every Day Smoker  . Packs/day: 0.25  . Years: 46.00  . Pack years: 11.50  . Types: Cigarettes  Smokeless Tobacco Former User  . Types: Snuff  Tobacco Comment   using a nicotine patch, trying to quit     Goals Met:  Independence with exercise equipment Exercise tolerated well No report of cardiac concerns or symptoms Strength training completed today  Goals Unmet:  Not Applicable  Comments: checkout time is 1430   Dr. Jehanzeb Memon is Medical Director for [facility]. 

## 2020-11-10 ENCOUNTER — Other Ambulatory Visit: Payer: Self-pay

## 2020-11-10 ENCOUNTER — Encounter (HOSPITAL_COMMUNITY)
Admission: RE | Admit: 2020-11-10 | Discharge: 2020-11-10 | Disposition: A | Discharge status: Home / Self Care | Payer: No Typology Code available for payment source | Source: Ambulatory Visit | Attending: Critical Care Medicine | Admitting: Critical Care Medicine

## 2020-11-10 DIAGNOSIS — J449 Chronic obstructive pulmonary disease, unspecified: Secondary | ICD-10-CM | POA: Diagnosis not present

## 2020-11-10 NOTE — Progress Notes (Signed)
I have reviewed a Home Exercise Prescription with Glen Cobb . Glen Cobb is currently exercising at home.  The patient was advised to walk 2-3 days a week for 25-30 minutes outside of rehab.  Glen Cobb and I discussed how to progress their exercise prescription.  The patient stated that their goals were to get back to living a more active lifestyle and to breathe better with exertion.  The patient stated that they understand the exercise prescription.  We reviewed exercise guidelines, target heart rate during exercise, RPE Scale, weather conditions, NTG use, endpoints for exercise, warmup and cool down.  Patient is encouraged to come to me with any questions. I will continue to follow up with the patient to assist them with progression and safety.

## 2020-11-10 NOTE — Progress Notes (Signed)
Daily Session Note  Patient Details  Name: Glen Cobb MRN: [id number] Date of Birth: 1956-03-19 Referring Provider:   Flowsheet Row PULMONARY REHAB COPD ORIENTATION from 10/17/2020 in [place]  Referring Provider Dr. Rosaria Ferries      Encounter Date: 11/10/2020  Check In:  Session Check In - 11/10/20 1330      Check-In   Supervising physician immediately available to respond to emergencies [facility] immediately available    Physician(s) Dr. Johnsie Cancel    Location [facility]    Staff Present Aundra Dubin, RN, BSN;Madison Audria Nine, MS, Exercise Physiologist;Dalton Kris Mouton, MS, ACSM-CEP, Exercise Physiologist    Virtual Visit No    Medication changes reported     No    Fall or balance concerns reported    No    Tobacco Cessation No Change    Warm-up and Cool-down Performed as group-led instruction    Resistance Training Performed Yes    VAD Patient? No    PAD/SET Patient? No      Pain Assessment   Currently in Pain? No/denies    Pain Score 0-No pain    Multiple Pain Sites No           Capillary Blood Glucose: No results found for this or any previous visit (from the past 24 hour(s)).    Social History   Tobacco Use  Smoking Status Current Every Day Smoker  . Packs/day: 0.25  . Years: 46.00  . Pack years: 11.50  . Types: Cigarettes  Smokeless Tobacco Former Systems developer  . Types: Snuff  Tobacco Comment   using a nicotine patch, trying to quit     Goals Met:  Proper associated with RPD/PD & O2 Sat Independence with exercise equipment Improved SOB with ADL's Using PLB without cueing & demonstrates good technique Exercise tolerated well No report of cardiac concerns or symptoms Strength training completed today  Goals Unmet:  Not Applicable  Comments: Check out 1430.   Dr. Kathie Dike is Medical Director for [facility].

## 2020-11-15 ENCOUNTER — Encounter (HOSPITAL_COMMUNITY): Payer: No Typology Code available for payment source

## 2020-11-17 ENCOUNTER — Encounter (HOSPITAL_COMMUNITY)
Admission: RE | Admit: 2020-11-17 | Discharge: 2020-11-17 | Disposition: A | Discharge status: Home / Self Care | Payer: No Typology Code available for payment source | Source: Ambulatory Visit | Attending: Critical Care Medicine | Admitting: Critical Care Medicine

## 2020-11-17 ENCOUNTER — Other Ambulatory Visit: Payer: Self-pay

## 2020-11-17 DIAGNOSIS — J449 Chronic obstructive pulmonary disease, unspecified: Secondary | ICD-10-CM | POA: Diagnosis not present

## 2020-11-17 NOTE — Progress Notes (Signed)
Daily Session Note  Patient Details  Name: Glen Cobb MRN: [id number] Date of Birth: 05/03/1956 Referring Provider:   Flowsheet Row PULMONARY REHAB COPD ORIENTATION from 10/17/2020 in [place]  Referring Provider Dr. Rosaria Ferries      Encounter Date: 11/17/2020  Check In:  Session Check In - 11/17/20 1330      Check-In   Supervising physician immediately available to respond to emergencies [facility] immediately available    Physician(s) Dr. Domenic Polite    Location [facility]    Staff Present Geanie Cooley, RN;Madison Audria Nine, MS, Exercise Physiologist;Dalton Kris Mouton, MS, ACSM-CEP, Exercise Physiologist    Virtual Visit No    Medication changes reported     No    Fall or balance concerns reported    No    Tobacco Cessation No Change    Warm-up and Cool-down Performed as group-led instruction    Resistance Training Performed Yes    VAD Patient? No    PAD/SET Patient? No      Pain Assessment   Currently in Pain? Yes    Pain Score 3     Pain Location Knee    Pain Orientation Left    Pain Descriptors / Indicators Constant    Pain Type Acute pain    Pain Onset 1 to 4 weeks ago    Pain Frequency Constant    Multiple Pain Sites No           Capillary Blood Glucose: No results found for this or any previous visit (from the past 24 hour(s)).    Social History   Tobacco Use  Smoking Status Current Every Day Smoker  . Packs/day: 0.25  . Years: 46.00  . Pack years: 11.50  . Types: Cigarettes  Smokeless Tobacco Former Systems developer  . Types: Snuff  Tobacco Comment   using a nicotine patch, trying to quit     Goals Met:  Independence with exercise equipment Exercise tolerated well No report of cardiac concerns or symptoms Strength training completed today  Goals Unmet:  Not Applicable  Comments: checkout time is 1430   Dr. Kathie Dike is Medical Director for [facility].

## 2020-11-22 ENCOUNTER — Encounter (HOSPITAL_COMMUNITY)
Admission: RE | Admit: 2020-11-22 | Discharge: 2020-11-22 | Disposition: A | Discharge status: Home / Self Care | Payer: No Typology Code available for payment source | Source: Ambulatory Visit | Attending: Critical Care Medicine | Admitting: Critical Care Medicine

## 2020-11-22 ENCOUNTER — Other Ambulatory Visit: Payer: Self-pay

## 2020-11-22 DIAGNOSIS — J449 Chronic obstructive pulmonary disease, unspecified: Secondary | ICD-10-CM | POA: Diagnosis present

## 2020-11-22 NOTE — Progress Notes (Signed)
Daily Session Note  Patient Details  Name: Glen Cobb MRN: [id number] Date of Birth: [birth year] Referring Provider:   Flowsheet Row PULMONARY REHAB COPD ORIENTATION from 10/17/2020 in [place]  Referring Provider Dr. Rosaria Ferries      Encounter Date: 11/22/2020  Check In:  Session Check In - 11/22/20 1330      Check-In   Supervising physician immediately available to respond to emergencies [facility] MD immediately available    Physician(s) Dr. Harl Bowie    Location [facility]    Staff Present Cathren Harsh, MS, Exercise Physiologist;Willman Cuny Kris Mouton, MS, ACSM-CEP, Exercise Physiologist    Virtual Visit No    Medication changes reported     No    Fall or balance concerns reported    No    Tobacco Cessation No Change    Warm-up and Cool-down Performed as group-led instruction    Resistance Training Performed Yes    VAD Patient? No    PAD/SET Patient? No      Pain Assessment   Currently in Pain? Yes    Pain Score 3     Pain Location Knee    Pain Orientation Left    Pain Descriptors / Indicators Constant    Pain Onset 1 to 4 weeks ago    Pain Frequency Constant    Multiple Pain Sites No           Capillary Blood Glucose: No results found for this or any previous visit (from the past 24 hour(s)).    Social History   Tobacco Use  Smoking Status Current Every Day Smoker  . Packs/day: 0.25  . Years: 46.00  . Pack years: 11.50  . Types: Cigarettes  Smokeless Tobacco Former Systems developer  . Types: Snuff  Tobacco Comment   using a nicotine patch, trying to quit     Goals Met:  Independence with exercise equipment Exercise tolerated well No report of cardiac concerns or symptoms Strength training completed today  Goals Unmet:  Not Applicable  Comments: checkout time is 1430   Dr. Kathie Dike is Medical Director for [facility].

## 2020-11-24 ENCOUNTER — Encounter (HOSPITAL_COMMUNITY)
Admission: RE | Admit: 2020-11-24 | Discharge: 2020-11-24 | Disposition: A | Discharge status: Home / Self Care | Payer: No Typology Code available for payment source | Source: Ambulatory Visit | Attending: Critical Care Medicine | Admitting: Critical Care Medicine

## 2020-11-24 ENCOUNTER — Other Ambulatory Visit: Payer: Self-pay

## 2020-11-24 DIAGNOSIS — J449 Chronic obstructive pulmonary disease, unspecified: Secondary | ICD-10-CM | POA: Diagnosis not present

## 2020-11-24 NOTE — Progress Notes (Signed)
Daily Session Note  Patient Details  Name: Glen Cobb MRN: [id number] Date of Birth: September 18, 1956 Referring Provider:   Flowsheet Row PULMONARY REHAB COPD ORIENTATION from 10/17/2020 in [place]  Referring Provider Dr. Rosaria Ferries      Encounter Date: 11/24/2020  Check In:  Session Check In - 11/24/20 1330      Check-In   Supervising physician immediately available to respond to emergencies [facility] MD immediately available    Physician(s) Dr. Harl Bowie    Location [facility]    Staff Present Cathren Harsh, MS, Exercise Physiologist;Lathyn Griggs Kris Mouton, MS, ACSM-CEP, Exercise Physiologist    Medication changes reported     No    Fall or balance concerns reported    No    Tobacco Cessation No Change    Warm-up and Cool-down Performed as group-led instruction    Resistance Training Performed Yes    VAD Patient? No    PAD/SET Patient? No      Pain Assessment   Currently in Pain? Yes    Pain Score 3     Pain Location Knee    Pain Orientation Left    Pain Descriptors / Indicators Constant    Pain Type Acute pain    Pain Onset 1 to 4 weeks ago    Pain Frequency Constant    Multiple Pain Sites No           Capillary Blood Glucose: No results found for this or any previous visit (from the past 24 hour(s)).    Social History   Tobacco Use  Smoking Status Current Every Day Smoker  . Packs/day: 0.25  . Years: 46.00  . Pack years: 11.50  . Types: Cigarettes  Smokeless Tobacco Former Systems developer  . Types: Snuff  Tobacco Comment   using a nicotine patch, trying to quit     Goals Met:  Independence with exercise equipment Exercise tolerated well No report of cardiac concerns or symptoms Strength training completed today  Goals Unmet:  Not Applicable  Comments: checkout time is 1430   Dr. Kathie Dike is Medical Director for [facility].

## 2020-11-29 ENCOUNTER — Other Ambulatory Visit: Payer: Self-pay

## 2020-11-29 ENCOUNTER — Encounter (HOSPITAL_COMMUNITY)
Admission: RE | Admit: 2020-11-29 | Discharge: 2020-11-29 | Disposition: A | Discharge status: Home / Self Care | Payer: No Typology Code available for payment source | Source: Ambulatory Visit | Attending: Critical Care Medicine | Admitting: Critical Care Medicine

## 2020-11-29 VITALS — Wt 175.5 lb

## 2020-11-29 DIAGNOSIS — J449 Chronic obstructive pulmonary disease, unspecified: Secondary | ICD-10-CM | POA: Diagnosis not present

## 2020-11-29 NOTE — Progress Notes (Signed)
Daily Session Note  Patient Details  Name: Glen Cobb MRN: [id number] Date of Birth: 22-Jun-1956 Referring Provider:   Flowsheet Row PULMONARY REHAB COPD ORIENTATION from 10/17/2020 in [place]  Referring Provider Dr. Rosaria Ferries      Encounter Date: 11/29/2020  Check In:  Session Check In - 11/29/20 1330      Check-In   Supervising physician immediately available to respond to emergencies [facility] MD immediately available    Physician(s) Dr. Harl Bowie    Location [facility]    Staff Present Hoy Register, MS, ACSM-CEP, Exercise Physiologist;Madison Audria Nine, MS, Exercise Physiologist    Virtual Visit No    Medication changes reported     No    Fall or balance concerns reported    No    Tobacco Cessation No Change    Warm-up and Cool-down Performed as group-led instruction    Resistance Training Performed Yes    VAD Patient? No    PAD/SET Patient? No      Pain Assessment   Currently in Pain? Yes    Pain Score 3     Pain Location Knee    Pain Orientation Left    Pain Descriptors / Indicators Constant    Pain Type Acute pain    Pain Onset 1 to 4 weeks ago    Pain Frequency Constant    Multiple Pain Sites Yes      2nd Pain Site   Pain Score 5    Pain Type Chronic pain    Pain Location Back    Pain Orientation Lower    Pain Descriptors / Indicators Constant    Pain Frequency Constant      Pain   Pain Onset More than a month ago           Capillary Blood Glucose: No results found for this or any previous visit (from the past 24 hour(s)).    Social History   Tobacco Use  Smoking Status Current Every Day Smoker  . Packs/day: 0.25  . Years: 46.00  . Pack years: 11.50  . Types: Cigarettes  Smokeless Tobacco Former Systems developer  . Types: Snuff  Tobacco Comment   using a nicotine patch, trying to quit     Goals Met:  Independence with exercise equipment Exercise tolerated well No report of cardiac concerns or symptoms Strength  training completed today  Goals Unmet:  Not Applicable  Comments: checkout time is 1430   Dr. Kathie Dike is Medical Director for [facility].

## 2020-12-01 ENCOUNTER — Other Ambulatory Visit: Payer: Self-pay

## 2020-12-01 ENCOUNTER — Encounter (HOSPITAL_COMMUNITY)
Admission: RE | Admit: 2020-12-01 | Discharge: 2020-12-01 | Disposition: A | Discharge status: Home / Self Care | Payer: No Typology Code available for payment source | Source: Ambulatory Visit | Attending: Critical Care Medicine | Admitting: Critical Care Medicine

## 2020-12-01 DIAGNOSIS — J449 Chronic obstructive pulmonary disease, unspecified: Secondary | ICD-10-CM

## 2020-12-01 NOTE — Progress Notes (Signed)
Pulmonary Individual Treatment Plan  Patient Details  Name: Glen Cobb MRN: [id number] Date of Birth: 1955-10-17 Referring Provider:   Flowsheet Row PULMONARY REHAB COPD ORIENTATION from 10/17/2020 in [place]  Referring Provider Dr. Rosaria Ferries      Initial Encounter Date:  Flowsheet Row PULMONARY REHAB COPD ORIENTATION from 10/17/2020 in [place]  Date 10/17/20      Visit Diagnosis: Chronic obstructive pulmonary disease, unspecified COPD type ([place])  Patient's Home Medications on Admission:   Current Outpatient Medications:  .  Acidophilus Lactobacillus CAPS, Take 2 tablets by mouth daily at 12 noon., Disp: , Rfl:  .  acyclovir (ZOVIRAX) 400 MG tablet, Take 400 mg by mouth 2 (two) times daily as needed (for flareup)., Disp: , Rfl:  .  albuterol-ipratropium (COMBIVENT) 18-103 MCG/ACT inhaler, Inhale 1 puff into the lungs every 6 (six) hours as needed., Disp: , Rfl:  .  allopurinol (ZYLOPRIM) 100 MG tablet, Take 100 mg by mouth daily., Disp: , Rfl:  .  amLODipine (NORVASC) 5 MG tablet, Take 10 mg by mouth daily., Disp: , Rfl:  .  budesonide-formoterol (SYMBICORT) 160-4.5 MCG/ACT inhaler, Inhale 2 puffs into the lungs 2 (two) times daily., Disp: , Rfl:  .  cyclobenzaprine (FLEXERIL) 10 MG tablet, Take 15 mg by mouth at bedtime., Disp: , Rfl:  .  gabapentin (NEURONTIN) 600 MG tablet, Take 1,200 mg by mouth 3 (three) times daily., Disp: , Rfl:  .  hydrOXYzine (VISTARIL) 25 MG capsule, Take 25 mg by mouth every 6 (six) hours as needed for itching (sleep)., Disp: , Rfl:  .  lidocaine (LIDODERM) 5 %, Place 1 patch onto the skin daily. Remove & Discard patch within 12 hours or as directed by MD, Disp: , Rfl:  .  omeprazole (PRILOSEC) 40 MG capsule, Take 40 mg by mouth daily., Disp: , Rfl:  .  potassium chloride (KLOR-CON) 10 MEQ tablet, Take 10 mEq by mouth daily., Disp: , Rfl:  .  predniSONE (DELTASONE) 20 MG tablet, Take 3 tablets by mouth  daily x1 day; then 2 tablets by mouth daily x2 days; then 1 tablet by mouth daily x3 days; then half tablet by mouth daily x3 days and stop prednisone. (Patient taking differently: Take 3 tablets by mouth daily x1 day; then 2 tablets by mouth daily x2 days; then 1 tablet by mouth daily x3 days; then half tablet by mouth daily x3 days and stop prednisone. As needed for gout flareup), Disp: 30 tablet, Rfl: 0 .  Propylene Glycol (SYSTANE BALANCE OP), Apply 1 drop to eye 4 (four) times daily. Both eyes, Disp: , Rfl:  .  Riboflavin 100 MG TABS, Take 400 mg by mouth daily., Disp: , Rfl:  .  sildenafil (VIAGRA) 100 MG tablet, Take 50 mg by mouth daily as needed for erectile dysfunction., Disp: , Rfl:  .  Tiotropium Bromide Monohydrate (SPIRIVA RESPIMAT) 2.5 MCG/ACT AERS, Inhale 2 puffs into the lungs daily., Disp: , Rfl:  .  traMADol (ULTRAM) 50 MG tablet, Take 100 mg by mouth 3 (three) times daily as needed for moderate pain., Disp: , Rfl:   Past Medical History: Past Medical History:  Diagnosis Date  . Arthritis   . Cancer ([place])    bladder cancer x 2  . Cataract    MD just watching  . COPD (chronic obstructive pulmonary disease) ([place])   . Emphysema of lung ([place])   . FH: migraines    last one 1 yr ago - OTC  med prn  . Gout   . HSV infection   . Hyperlipidemia    no med. diet  . Hypertension   . Kidney stone    passed stone - no surgery  . Osteoporosis   . Sleep apnea    Does not use CPAP nightly.    Tobacco Use: Social History   Tobacco Use  Smoking Status Current Every Day Smoker  . Packs/day: 0.25  . Years: 46.00  . Pack years: 11.50  . Types: Cigarettes  Smokeless Tobacco Former Systems developer  . Types: Snuff  Tobacco Comment   using a nicotine patch, trying to quit     Labs: Recent Review Flowsheet Data   There is no flowsheet data to display.     Capillary Blood Glucose: Lab Results  Component Value Date   GLUCAP 191 (H) 08/22/2019   GLUCAP 145 (H) 08/20/2019   GLUCAP  182 (H) 08/19/2019     Pulmonary Assessment Scores:  Pulmonary Assessment Scores    Row Name 10/17/20 1329         ADL UCSD   ADL Phase Entry     SOB Score total 24           CAT Score   CAT Score 22           mMRC Score   mMRC Score 2           UCSD: Self-administered rating of dyspnea associated with activities of daily living (ADLs) 6-point scale (0 = "not at all" to 5 = "maximal or unable to do because of breathlessness")  Scoring Scores range from 0 to 120.  Minimally important difference is 5 units  CAT: CAT can identify the health impairment of COPD patients and is better correlated with disease progression.  CAT has a scoring range of zero to 40. The CAT score is classified into four groups of low (less than 10), medium (10 - 20), high (21-30) and very high (31-40) based on the impact level of disease on health status. A CAT score over 10 suggests significant symptoms.  A worsening CAT score could be explained by an exacerbation, poor medication adherence, poor inhaler technique, or progression of COPD or comorbid conditions.  CAT MCID is 2 points  mMRC: mMRC (Modified Medical Research Council) Dyspnea Scale is used to assess the degree of baseline functional disability in patients of respiratory disease due to dyspnea. No minimal important difference is established. A decrease in score of 1 point or greater is considered a positive change.   Pulmonary Function Assessment:  Pulmonary Function Assessment - 10/17/20 1325      Breath   Shortness of Breath Yes;Limiting activity;Fear of Shortness of Breath           Exercise Target Goals: Exercise Program Goal: Individual exercise prescription set using results from initial 6 min walk test and THRR while considering  patient's activity barriers and safety.   Exercise Prescription Goal: Initial exercise prescription builds to 30-45 minutes a day of aerobic activity, 2-3 days per week.  Home exercise guidelines  will be given to patient during program as part of exercise prescription that the participant will acknowledge.  Activity Barriers & Risk Stratification:  Activity Barriers & Cardiac Risk Stratification - 10/17/20 1322      Activity Barriers & Cardiac Risk Stratification   Activity Barriers Arthritis;Back Problems;Neck/Spine Problems;Joint Problems;Deconditioning;Muscular Weakness;Shortness of Breath;Balance Concerns;History of Falls    Cardiac Risk Stratification Moderate  6 Minute Walk:  6 Minute Walk    Row Name 10/17/20 1459         6 Minute Walk   Phase Initial     Distance 1100 feet     Walk Time 6 minutes     # of Rest Breaks 0     MPH 2.08     METS 3.14     RPE 9     Perceived Dyspnea  11     VO2 Peak 10.99     Symptoms Yes (comment)     Comments lower back pain 5/10, left foot pain 3/10, right hip pain 3/10     Resting HR 77 bpm     Resting BP 132/78     Resting Oxygen Saturation  94 %     Exercise Oxygen Saturation  during 6 min walk 88 %     Max Ex. HR 94 bpm     Max Ex. BP 142/78     2 Minute Post BP 128/76           Interval HR   1 Minute HR 92     2 Minute HR 91     3 Minute HR 92     4 Minute HR 92     5 Minute HR 94     6 Minute HR 90     2 Minute Post HR 87     Interval Heart Rate? Yes           Interval Oxygen   Interval Oxygen? Yes     Baseline Oxygen Saturation % 94 %     1 Minute Oxygen Saturation % 92 %     1 Minute Liters of Oxygen 0 L     2 Minute Oxygen Saturation % 92 %     2 Minute Liters of Oxygen 0 L     3 Minute Oxygen Saturation % 91 %     3 Minute Liters of Oxygen 0 L     4 Minute Oxygen Saturation % 91 %     4 Minute Liters of Oxygen 0 L     5 Minute Oxygen Saturation % 89 %     5 Minute Liters of Oxygen 0 L     6 Minute Oxygen Saturation % 88 %     6 Minute Liters of Oxygen 0 L     2 Minute Post Oxygen Saturation % 97 %     2 Minute Post Liters of Oxygen 0 L            Oxygen Initial Assessment:   Oxygen Initial Assessment - 10/17/20 1458      Initial 6 min Walk   Oxygen Used None      Program Oxygen Prescription   Program Oxygen Prescription None      Intervention   Short Term Goals To learn and exhibit compliance with exercise, home and travel O2 prescription;To learn and understand importance of monitoring SPO2 with pulse oximeter and demonstrate accurate use of the pulse oximeter.;To learn and understand importance of maintaining oxygen saturations>88%;To learn and demonstrate proper pursed lip breathing techniques or other breathing techniques.;To learn and demonstrate proper use of respiratory medications    Long  Term Goals Exhibits compliance with exercise, home and travel O2 prescription;Verbalizes importance of monitoring SPO2 with pulse oximeter and return demonstration;Maintenance of O2 saturations>88%;Exhibits proper breathing techniques, such as pursed lip breathing or other method taught during program session;Compliance with respiratory medication;Demonstrates proper  use of MDI's           Oxygen Re-Evaluation:  Oxygen Re-Evaluation    Row Name 11/01/20 0747 11/29/20 1455           Program Oxygen Prescription   Program Oxygen Prescription None None             Home Oxygen   Home Oxygen Device None None      Sleep Oxygen Prescription None None      Home Exercise Oxygen Prescription None None      Home Resting Oxygen Prescription None None      Compliance with Home Oxygen Use Yes Yes             Goals/Expected Outcomes   Short Term Goals To learn and exhibit compliance with exercise, home and travel O2 prescription;To learn and understand importance of monitoring SPO2 with pulse oximeter and demonstrate accurate use of the pulse oximeter.;To learn and understand importance of maintaining oxygen saturations>88%;To learn and demonstrate proper pursed lip breathing techniques or other breathing techniques.;To learn and demonstrate proper use of respiratory  medications To learn and exhibit compliance with exercise, home and travel O2 prescription;To learn and understand importance of monitoring SPO2 with pulse oximeter and demonstrate accurate use of the pulse oximeter.;To learn and understand importance of maintaining oxygen saturations>88%;To learn and demonstrate proper pursed lip breathing techniques or other breathing techniques.;To learn and demonstrate proper use of respiratory medications      Long  Term Goals Exhibits compliance with exercise, home and travel O2 prescription;Verbalizes importance of monitoring SPO2 with pulse oximeter and return demonstration;Maintenance of O2 saturations>88%;Exhibits proper breathing techniques, such as pursed lip breathing or other method taught during program session;Compliance with respiratory medication;Demonstrates proper use of MDI's Exhibits compliance with exercise, home and travel O2 prescription;Verbalizes importance of monitoring SPO2 with pulse oximeter and return demonstration;Maintenance of O2 saturations>88%;Exhibits proper breathing techniques, such as pursed lip breathing or other method taught during program session;Compliance with respiratory medication;Demonstrates proper use of MDI's      Goals/Expected Outcomes Compliance Compliance             Oxygen Discharge (Final Oxygen Re-Evaluation):  Oxygen Re-Evaluation - 11/29/20 1455      Program Oxygen Prescription   Program Oxygen Prescription None      Home Oxygen   Home Oxygen Device None    Sleep Oxygen Prescription None    Home Exercise Oxygen Prescription None    Home Resting Oxygen Prescription None    Compliance with Home Oxygen Use Yes      Goals/Expected Outcomes   Short Term Goals To learn and exhibit compliance with exercise, home and travel O2 prescription;To learn and understand importance of monitoring SPO2 with pulse oximeter and demonstrate accurate use of the pulse oximeter.;To learn and understand importance of  maintaining oxygen saturations>88%;To learn and demonstrate proper pursed lip breathing techniques or other breathing techniques.;To learn and demonstrate proper use of respiratory medications    Long  Term Goals Exhibits compliance with exercise, home and travel O2 prescription;Verbalizes importance of monitoring SPO2 with pulse oximeter and return demonstration;Maintenance of O2 saturations>88%;Exhibits proper breathing techniques, such as pursed lip breathing or other method taught during program session;Compliance with respiratory medication;Demonstrates proper use of MDI's    Goals/Expected Outcomes Compliance           Initial Exercise Prescription:  Initial Exercise Prescription - 10/17/20 1500      Date of Initial Exercise RX and Referring Provider   Date  10/17/20    Referring Provider Dr. Rosaria Ferries    Expected Discharge Date 01/02/21   This is his current [place]. date. We have requested it be extended.     NuStep   Level 1    SPM 60    Minutes 39      Prescription Details   Frequency (times per week) 2    Duration Progress to 30 minutes of continuous aerobic without signs/symptoms of physical distress      Intensity   THRR 40-80% of Max Heartrate 63-126    Ratings of Perceived Exertion 11-13    Perceived Dyspnea 0-4      Progression   Progression Continue progressive overload as per policy without signs/symptoms or physical distress.      Resistance Training   Training Prescription Yes    Weight 3 lbs    Reps 10-15           Perform Capillary Blood Glucose checks as needed.  Exercise Prescription Changes:   Exercise Prescription Changes    Row Name 11/02/20 0700 11/10/20 1400 11/29/20 1400         Response to Exercise   Blood Pressure (Admit) 142/80 142/72 122/74     Blood Pressure (Exercise) 160/78 170/76 150/76     Blood Pressure (Exit) 144/82 120/60 124/76     Heart Rate (Admit) 83 bpm 84 bpm 85 bpm     Heart Rate (Exercise) 93 bpm 104 bpm 107 bpm      Heart Rate (Exit) 88 bpm 89 bpm 93 bpm     Oxygen Saturation (Admit) 97 % 95 % 96 %     Oxygen Saturation (Exercise) 95 % 96 % 93 %     Oxygen Saturation (Exit) 95 % 94 % 94 %     Rating of Perceived Exertion (Exercise) 11 10 10      Perceived Dyspnea (Exercise) 12 10 10      Duration Continue with 30 min of aerobic exercise without signs/symptoms of physical distress. Continue with 30 min of aerobic exercise without signs/symptoms of physical distress. Continue with 30 min of aerobic exercise without signs/symptoms of physical distress.     Intensity THRR unchanged THRR unchanged THRR unchanged           Progression   Progression Continue to progress workloads to maintain intensity without signs/symptoms of physical distress. Continue to progress workloads to maintain intensity without signs/symptoms of physical distress. Continue to progress workloads to maintain intensity without signs/symptoms of physical distress.           Resistance Training   Training Prescription Yes Yes Yes     Weight 3 lbs 5 5 lbs     Reps 10-15 10-15 10-15     Time 10 Minutes 10 Minutes 10 Minutes           NuStep   Level 2 3 3      SPM 109 112 116     Minutes 39 39 22     METs 2.2 2.4 2.5           Recumbant Elliptical   Level - - 1     RPM - - 66     Minutes - - 17     METs - - 4.9           Home Exercise Plan   Plans to continue exercise at - Home (comment) -     Frequency - Add 2 additional days to program exercise sessions. -  Initial Home Exercises Provided - 11/10/20 -            Exercise Comments:   Exercise Comments    Row Name 10/20/20 1441 11/10/20 1438         Exercise Comments Patient completed first exercise session today. He tolerated exercise well and was setting goals for himself to complete 1 mile on each station. He is excited to come back to rehab and continue the program. Reviewed home exercise with patient today. He is currently exercising at home by walking a  little bit and doing some exercises he learned from physical therapy. He is going to start walking more and is going to ask a family member to walk with him.             Exercise Goals and Review:   Exercise Goals    Row Name 10/17/20 1506 11/02/20 0744 11/29/20 1457         Exercise Goals   Increase Physical Activity Yes Yes Yes     Intervention Provide advice, education, support and counseling about physical activity/exercise needs.;Develop an individualized exercise prescription for aerobic and resistive training based on initial evaluation findings, risk stratification, comorbidities and participant's personal goals. Provide advice, education, support and counseling about physical activity/exercise needs.;Develop an individualized exercise prescription for aerobic and resistive training based on initial evaluation findings, risk stratification, comorbidities and participant's personal goals. Provide advice, education, support and counseling about physical activity/exercise needs.;Develop an individualized exercise prescription for aerobic and resistive training based on initial evaluation findings, risk stratification, comorbidities and participant's personal goals.     Expected Outcomes Short Term: Attend rehab on a regular basis to increase amount of physical activity.;Long Term: Add in home exercise to make exercise part of routine and to increase amount of physical activity.;Long Term: Exercising regularly at least 3-5 days a week. Short Term: Attend rehab on a regular basis to increase amount of physical activity.;Long Term: Add in home exercise to make exercise part of routine and to increase amount of physical activity.;Long Term: Exercising regularly at least 3-5 days a week. Short Term: Attend rehab on a regular basis to increase amount of physical activity.;Long Term: Add in home exercise to make exercise part of routine and to increase amount of physical activity.;Long Term: Exercising  regularly at least 3-5 days a week.     Increase Strength and Stamina Yes Yes Yes     Intervention Provide advice, education, support and counseling about physical activity/exercise needs.;Develop an individualized exercise prescription for aerobic and resistive training based on initial evaluation findings, risk stratification, comorbidities and participant's personal goals. Provide advice, education, support and counseling about physical activity/exercise needs.;Develop an individualized exercise prescription for aerobic and resistive training based on initial evaluation findings, risk stratification, comorbidities and participant's personal goals. Provide advice, education, support and counseling about physical activity/exercise needs.;Develop an individualized exercise prescription for aerobic and resistive training based on initial evaluation findings, risk stratification, comorbidities and participant's personal goals.     Expected Outcomes Short Term: Increase workloads from initial exercise prescription for resistance, speed, and METs.;Short Term: Perform resistance training exercises routinely during rehab and add in resistance training at home;Long Term: Improve cardiorespiratory fitness, muscular endurance and strength as measured by increased METs and functional capacity (6MWT) Short Term: Increase workloads from initial exercise prescription for resistance, speed, and METs.;Short Term: Perform resistance training exercises routinely during rehab and add in resistance training at home;Long Term: Improve cardiorespiratory fitness, muscular endurance and strength as measured by  increased METs and functional capacity (6MWT) Short Term: Increase workloads from initial exercise prescription for resistance, speed, and METs.;Short Term: Perform resistance training exercises routinely during rehab and add in resistance training at home;Long Term: Improve cardiorespiratory fitness, muscular endurance and  strength as measured by increased METs and functional capacity (6MWT)     Able to understand and use rate of perceived exertion (RPE) scale Yes Yes Yes     Intervention Provide education and explanation on how to use RPE scale Provide education and explanation on how to use RPE scale Provide education and explanation on how to use RPE scale     Expected Outcomes Short Term: Able to use RPE daily in rehab to express subjective intensity level;Long Term:  Able to use RPE to guide intensity level when exercising independently Short Term: Able to use RPE daily in rehab to express subjective intensity level;Long Term:  Able to use RPE to guide intensity level when exercising independently Short Term: Able to use RPE daily in rehab to express subjective intensity level;Long Term:  Able to use RPE to guide intensity level when exercising independently     Able to understand and use Dyspnea scale Yes Yes Yes     Intervention Provide education and explanation on how to use Dyspnea scale Provide education and explanation on how to use Dyspnea scale Provide education and explanation on how to use Dyspnea scale     Expected Outcomes Short Term: Able to use Dyspnea scale daily in rehab to express subjective sense of shortness of breath during exertion;Long Term: Able to use Dyspnea scale to guide intensity level when exercising independently Short Term: Able to use Dyspnea scale daily in rehab to express subjective sense of shortness of breath during exertion;Long Term: Able to use Dyspnea scale to guide intensity level when exercising independently Short Term: Able to use Dyspnea scale daily in rehab to express subjective sense of shortness of breath during exertion;Long Term: Able to use Dyspnea scale to guide intensity level when exercising independently     Knowledge and understanding of Target Heart Rate Range (THRR) Yes Yes Yes     Intervention Provide education and explanation of THRR including how the numbers  were predicted and where they are located for reference Provide education and explanation of THRR including how the numbers were predicted and where they are located for reference Provide education and explanation of THRR including how the numbers were predicted and where they are located for reference     Expected Outcomes Short Term: Able to state/look up THRR;Long Term: Able to use THRR to govern intensity when exercising independently;Short Term: Able to use daily as guideline for intensity in rehab Short Term: Able to state/look up THRR;Long Term: Able to use THRR to govern intensity when exercising independently;Short Term: Able to use daily as guideline for intensity in rehab Short Term: Able to state/look up THRR;Long Term: Able to use THRR to govern intensity when exercising independently;Short Term: Able to use daily as guideline for intensity in rehab     Understanding of Exercise Prescription Yes Yes Yes     Intervention Provide education, explanation, and written materials on patient's individual exercise prescription Provide education, explanation, and written materials on patient's individual exercise prescription Provide education, explanation, and written materials on patient's individual exercise prescription     Expected Outcomes Short Term: Able to explain program exercise prescription;Long Term: Able to explain home exercise prescription to exercise independently Short Term: Able to explain program exercise prescription;Long Term:  Able to explain home exercise prescription to exercise independently Short Term: Able to explain program exercise prescription;Long Term: Able to explain home exercise prescription to exercise independently            Exercise Goals Re-Evaluation :  Exercise Goals Re-Evaluation    Row Name 11/01/20 0744 11/29/20 1458           Exercise Goal Re-Evaluation   Exercise Goals Review Increase Strength and Stamina;Increase Physical Activity;Able to  understand and use rate of perceived exertion (RPE) scale;Knowledge and understanding of Target Heart Rate Range (THRR);Able to check pulse independently;Understanding of Exercise Prescription Increase Strength and Stamina;Increase Physical Activity;Able to understand and use rate of perceived exertion (RPE) scale;Knowledge and understanding of Target Heart Rate Range (THRR);Able to check pulse independently;Understanding of Exercise Prescription      Comments Patient has completed 4 exercise sessions. He has tolerated exercise well and has settled in to the routine. He keeps setting goals for himself to complete on the NuStep. He is progressing quickly. He is currently exercising at 2.2 METs on the NuStep. Will continue to monitor and progress as able. Patient has completed 11 sessions of pulmonary rehab. He has been able to tolerate exercise well has been progressing through the program. He has been able to use the elliptical for one of his stations now which is more challenging for him. He is currently exercising at 2.5 METs on the stepper. Will continue to monitor and progress as able.      Expected Outcomes Through exercise at rehab and with a home exercise program, patient will achieve their goals. Through exercise at rehab and with a home exercise program, patient will achieve their goals.             Discharge Exercise Prescription (Final Exercise Prescription Changes):  Exercise Prescription Changes - 11/29/20 1400      Response to Exercise   Blood Pressure (Admit) 122/74    Blood Pressure (Exercise) 150/76    Blood Pressure (Exit) 124/76    Heart Rate (Admit) 85 bpm    Heart Rate (Exercise) 107 bpm    Heart Rate (Exit) 93 bpm    Oxygen Saturation (Admit) 96 %    Oxygen Saturation (Exercise) 93 %    Oxygen Saturation (Exit) 94 %    Rating of Perceived Exertion (Exercise) 10    Perceived Dyspnea (Exercise) 10    Duration Continue with 30 min of aerobic exercise without signs/symptoms  of physical distress.    Intensity THRR unchanged      Progression   Progression Continue to progress workloads to maintain intensity without signs/symptoms of physical distress.      Resistance Training   Training Prescription Yes    Weight 5 lbs    Reps 10-15    Time 10 Minutes      NuStep   Level 3    SPM 116    Minutes 22    METs 2.5      Recumbant Elliptical   Level 1    RPM 66    Minutes 17    METs 4.9           Nutrition:  Target Goals: Understanding of nutrition guidelines, daily intake of sodium 1500mg , cholesterol 200mg , calories 30% from fat and 7% or less from saturated fats, daily to have 5 or more servings of fruits and vegetables.  Biometrics:  Pre Biometrics - 11/01/20 0748      Pre Biometrics   Weight 79.4 kg  BMI (Calculated) 25.84            Nutrition Therapy Plan and Nutrition Goals:  Nutrition Therapy & Goals - 11/21/20 1454      Personal Nutrition Goals   Comments We will continue to provide nutrition education through handouts.      Intervention Plan   Intervention Nutrition handout(s) given to patient.           Nutrition Assessments:  Nutrition Assessments - 10/17/20 1340      MEDFICTS Scores   Pre Score 62          MEDIFICTS Score Key:  ?70 Need to make dietary changes   40-70 Heart Healthy Diet  ? 40 Therapeutic Level Cholesterol Diet   Picture Your Plate Scores:  <88 Unhealthy dietary pattern with much room for improvement.  41-50 Dietary pattern unlikely to meet recommendations for good health and room for improvement.  51-60 More healthful dietary pattern, with some room for improvement.   >60 Healthy dietary pattern, although there may be some specific behaviors that could be improved.    Nutrition Goals Re-Evaluation:   Nutrition Goals Discharge (Final Nutrition Goals Re-Evaluation):   Psychosocial: Target Goals: Acknowledge presence or absence of significant depression and/or stress,  maximize coping skills, provide positive support system. Participant is able to verbalize types and ability to use techniques and skills needed for reducing stress and depression.  Initial Review & Psychosocial Screening:  Initial Psych Review & Screening - 10/17/20 1318      Initial Review   Current issues with Current Sleep Concerns   He has a hard time falling asleep and staying asleep. He cannot sleep more than a couple of hours at a time. He has been tested for OSA, but he has not gotten his results back yet.     Family Dynamics   Good Support System? Yes    Comments His older brother and younger brother support him. He has some friends that he can call and talk to about his problems.      Barriers   Psychosocial barriers to participate in program There are no identifiable barriers or psychosocial needs.      Screening Interventions   Interventions Encouraged to exercise    Expected Outcomes Short Term goal: Utilizing psychosocial counselor, staff and physician to assist with identification of specific Stressors or current issues interfering with healing process. Setting desired goal for each stressor or current issue identified.;Long Term goal: The participant improves quality of Life and PHQ9 Scores as seen by post scores and/or verbalization of changes           Quality of Life Scores:  Quality of Life - 10/17/20 1456      Quality of Life   Select Quality of Life      Quality of Life Scores   Health/Function Pre 21.19 %    Socioeconomic Pre 26.92 %    Psych/Spiritual Pre 24.29 %    Family Pre 25.5 %    GLOBAL Pre 23.34 %          Scores of 19 and below usually indicate a poorer quality of life in these areas.  A difference of  2-3 points is a clinically meaningful difference.  A difference of 2-3 points in the total score of the Quality of Life Index has been associated with significant improvement in overall quality of life, self-image, physical symptoms, and general  health in studies assessing change in quality of life.   PHQ-9: Recent  Review Flowsheet Data    Depression screen [facility] 2/9 10/17/2020   Decreased Interest 1   Down, Depressed, Hopeless 0   PHQ - 2 Score 1   Altered sleeping 2   Tired, decreased energy 1   Change in appetite 0   Feeling bad or failure about yourself  0   Trouble concentrating 0   Moving slowly or fidgety/restless 0   Suicidal thoughts 0   PHQ-9 Score 4   Difficult doing work/chores Not difficult at all     Interpretation of Total Score  Total Score Depression Severity:  1-4 = Minimal depression, 5-9 = Mild depression, 10-14 = Moderate depression, 15-19 = Moderately severe depression, 20-27 = Severe depression   Psychosocial Evaluation and Intervention:  Psychosocial Evaluation - 10/17/20 1455      Psychosocial Evaluation & Interventions   Interventions Encouraged to exercise with the program and follow exercise prescription    Comments Patient has no identifiable psychosocial concerns at orientation.    Expected Outcomes Patient will continue to not have any identifiable psychosocial concerns throughout his time is the program.           Psychosocial Re-Evaluation:  Psychosocial Re-Evaluation    [place] Name 10/28/20 0827 11/21/20 1434           Psychosocial Re-Evaluation   Current issues with Current Stress Concerns None Identified      Comments Patient is new to the program completing 3 sessions. His initial QOL score was 23.34 and his PHQ-9 score was 5. He is using Vistaril 25 mg q6 hours for itching and sleep but he does use it for sleep. He seems to enjoy coming to the program. We will continue to monitor. Patient continues to have no psychosocial issues identified.  He has completed 8 sessions with consistent attendance. He demonstrates a very positive attitude and works hard during the sessions. Will continue to monitor      Expected Outcomes Patient's sleep with be managed at [facility] with no additional  psychosocial issues identifiied. Patient will have no psychosocial issues identified at discharge.      Interventions Encouraged to attend Pulmonary Rehabilitation for the exercise;Relaxation education;Stress management education Encouraged to attend Pulmonary Rehabilitation for the exercise;Relaxation education;Stress management education      Continue Psychosocial Services  No Follow up required No Follow up required             Initial Review   Source of Stress Concerns None Identified None Identified             Psychosocial Discharge (Final Psychosocial Re-Evaluation):  Psychosocial Re-Evaluation - 11/21/20 1434      Psychosocial Re-Evaluation   Current issues with None Identified    Comments Patient continues to have no psychosocial issues identified.  He has completed 8 sessions with consistent attendance. He demonstrates a very positive attitude and works hard during the sessions. Will continue to monitor    Expected Outcomes Patient will have no psychosocial issues identified at discharge.    Interventions Encouraged to attend Pulmonary Rehabilitation for the exercise;Relaxation education;Stress management education    Continue Psychosocial Services  No Follow up required      Initial Review   Source of Stress Concerns None Identified            Education: Education Goals: Education classes will be provided on a weekly basis, covering required topics. Participant will state understanding/return demonstration of topics presented.  Learning Barriers/Preferences:  Learning Barriers/Preferences - 10/17/20 1320  Learning Barriers/Preferences   Learning Barriers None    Learning Preferences Pictoral;Skilled Demonstration           Education Topics: How Lungs Work and Diseases: - Discuss the anatomy of the lungs and diseases that can affect the lungs, such as COPD.   Exercise: -Discuss the importance of exercise, FITT principles of exercise, normal and abnormal  responses to exercise, and how to exercise safely.   Environmental Irritants: -Discuss types of environmental irritants and how to limit exposure to environmental irritants.   Meds/Inhalers and oxygen: - Discuss respiratory medications, definition of an inhaler and oxygen, and the proper way to use an inhaler and oxygen.   Energy Saving Techniques: - Discuss methods to conserve energy and decrease shortness of breath when performing activities of daily living.  Flowsheet Row PULMONARY REHAB CHRONIC OBSTRUCTIVE PULMONARY DISEASE from 11/24/2020 in [place]  Date 10/20/20  Educator Hand Out  Instruction Review Code 1- Verbalizes Understanding      Bronchial Hygiene / Breathing Techniques: - Discuss breathing mechanics, pursed-lip breathing technique,  proper posture, effective ways to clear airways, and other functional breathing techniques Flowsheet Row PULMONARY REHAB CHRONIC OBSTRUCTIVE PULMONARY DISEASE from 11/24/2020 in [place]  Date 10/27/20  Educator pb  Instruction Review Code 1- Verbalizes Understanding      Cleaning Equipment: - Provides group verbal and written instruction about the health risks of elevated stress, cause of high stress, and healthy ways to reduce stress. Flowsheet Row PULMONARY REHAB CHRONIC OBSTRUCTIVE PULMONARY DISEASE from 11/24/2020 in [place]  Date 11/03/20  Educator Handout  Instruction Review Code 1- Verbalizes Understanding      Nutrition I: Fats: - Discuss the types of cholesterol, what cholesterol does to the body, and how cholesterol levels can be controlled. Flowsheet Row PULMONARY REHAB CHRONIC OBSTRUCTIVE PULMONARY DISEASE from 11/24/2020 in [place]  Date 11/10/20  Educator handout  Instruction Review Code 1- Verbalizes Understanding      Nutrition II: Labels: -Discuss the different components of food labels and how to read food  labels. Flowsheet Row PULMONARY REHAB CHRONIC OBSTRUCTIVE PULMONARY DISEASE from 11/24/2020 in [place]  Date 11/17/20  Educator DF  Instruction Review Code 2- Demonstrated Understanding      Respiratory Infections: - Discuss the signs and symptoms of respiratory infections, ways to prevent respiratory infections, and the importance of seeking medical treatment when having a respiratory infection. Flowsheet Row PULMONARY REHAB CHRONIC OBSTRUCTIVE PULMONARY DISEASE from 11/24/2020 in [place]  Date 11/24/20  Educator DF  Instruction Review Code 2- Demonstrated Understanding      Stress I: Signs and Symptoms: - Discuss the causes of stress, how stress may lead to anxiety and depression, and ways to limit stress.   Stress II: Relaxation: -Discuss relaxation techniques to limit stress.   Oxygen for Home/Travel: - Discuss how to prepare for travel when on oxygen and proper ways to transport and store oxygen to ensure safety.   Knowledge Questionnaire Score:  Knowledge Questionnaire Score - 10/17/20 1326      Knowledge Questionnaire Score   Pre Score 14/18           Core Components/Risk Factors/Patient Goals at Admission:  Personal Goals and Risk Factors at Admission - 10/17/20 1321      Core Components/Risk Factors/Patient Goals on Admission    Weight Management Yes;Weight Loss    Intervention Weight Management: Develop a combined nutrition and exercise program designed to  reach desired caloric intake, while maintaining appropriate intake of nutrient and fiber, sodium and fats, and appropriate energy expenditure required for the weight goal.;Weight Management: Provide education and appropriate resources to help participant work on and attain dietary goals.;Weight Management/Obesity: Establish reasonable short term and long term weight goals.;Obesity: Provide education and appropriate resources to help participant work on and attain  dietary goals.    Expected Outcomes Short Term: Continue to assess and modify interventions until short term weight is achieved;Long Term: Adherence to nutrition and physical activity/exercise program aimed toward attainment of established weight goal;Weight Maintenance: Understanding of the daily nutrition guidelines, which includes 25-35% calories from fat, 7% or less cal from saturated fats, less than 200mg  cholesterol, less than 1.5gm of sodium, & 5 or more servings of fruits and vegetables daily;Weight Loss: Understanding of general recommendations for a balanced deficit meal plan, which promotes 1-2 lb weight loss per week and includes a negative energy balance of [phone] kcal/d;Understanding of distribution of calorie intake throughout the day with the consumption of 4-5 meals/snacks;Understanding recommendations for meals to include 15-35% energy as protein, 25-35% energy from fat, 35-60% energy from carbohydrates, less than 200mg  of dietary cholesterol, 20-35 gm of total fiber daily;Weight Gain: Understanding of general recommendations for a high calorie, high protein meal plan that promotes weight gain by distributing calorie intake throughout the day with the consumption for 4-5 meals, snacks, and/or supplements    Tobacco Cessation Yes    Number of packs per day 1/2    Intervention Assist the participant in steps to quit. Provide individualized education and counseling about committing to Tobacco Cessation, relapse prevention, and pharmacological support that can be provided by physician.;Advice worker, assist with locating and accessing local/national Quit Smoking programs, and support quit date choice.    Expected Outcomes Short Term: Will quit all tobacco product use, adhering to prevention of relapse plan.;Long Term: Complete abstinence from all tobacco products for at least 12 months from quit date.;Short Term: Will demonstrate readiness to quit, by selecting a quit date.     Improve shortness of breath with ADL's Yes    Intervention Provide education, individualized exercise plan and daily activity instruction to help decrease symptoms of SOB with activities of daily living.    Expected Outcomes Short Term: Improve cardiorespiratory fitness to achieve a reduction of symptoms when performing ADLs;Long Term: Be able to perform more ADLs without symptoms or delay the onset of symptoms           Core Components/Risk Factors/Patient Goals Review:   Goals and Risk Factor Review    Row Name 10/28/20 0829 11/21/20 1456           Core Components/Risk Factors/Patient Goals Review   Personal Goals Review Improve shortness of breath with ADL's;Tobacco Cessation;Weight Management/Obesity Improve shortness of breath with ADL's;Tobacco Cessation      Review Patient is new to the program completing 3 sessions. He was referred to pulmonary rehab with COPD. He continues to smoke 1/2 ppd cigerattes. His personal goals for the program are to breathe better and get stronger. We will continue to monitor as he works toward meeting these goals. Patient has completed 8 sessions and maintianing his weight. He continues to smoke, will encourge to start a smoking cessation plan. His personal goals are to get stronger and breath better. His O2 sats average in the upper 90's Doing good with Purse lipped breathing techniques.  We will continue to monitor as patient works toward meeting these goals.  Expected Outcomes Patient will complete the program meeting both personal and program goal.s Patient will complete the program meeting both personal and program goal.s             Core Components/Risk Factors/Patient Goals at Discharge (Final Review):   Goals and Risk Factor Review - 11/21/20 1456      Core Components/Risk Factors/Patient Goals Review   Personal Goals Review Improve shortness of breath with ADL's;Tobacco Cessation    Review Patient has completed 8 sessions and maintianing  his weight. He continues to smoke, will encourge to start a smoking cessation plan. His personal goals are to get stronger and breath better. His O2 sats average in the upper 90's Doing good with Purse lipped breathing techniques.  We will continue to monitor as patient works toward meeting these goals.    Expected Outcomes Patient will complete the program meeting both personal and program goal.s           ITP Comments:   Comments: ITP REVIEW Pt is making expected progress toward pulmonary rehab goals after completing 12 sessions. Recommend continued exercise, life style modification, education, and utilization of breathing techniques to increase stamina and strength and decrease shortness of breath with exertion.

## 2020-12-01 NOTE — Progress Notes (Signed)
Daily Session Note  Patient Details  Name: Glen Cobb MRN: [id number] Date of Birth: [birth year] Referring Provider:   Flowsheet Row PULMONARY REHAB COPD ORIENTATION from 10/17/2020 in [place]  Referring Provider Dr. Rosaria Ferries      Encounter Date: 12/01/2020  Check In:  Session Check In - 12/01/20 1330      Check-In   Supervising physician immediately available to respond to emergencies [facility] MD immediately available    Physician(s) Dr. Domenic Polite    Location [facility]    Staff Present Hoy Register, MS, ACSM-CEP, Exercise Physiologist;Madison Audria Nine, MS, Exercise Physiologist;Phyllis Billingsley, RN    Virtual Visit No    Medication changes reported     No    Fall or balance concerns reported    No    Tobacco Cessation No Change    Warm-up and Cool-down Performed as group-led instruction    Resistance Training Performed Yes    VAD Patient? No    PAD/SET Patient? No      Pain Assessment   Currently in Pain? Yes    Pain Score 5     Pain Location Back    Pain Orientation Lower    Pain Descriptors / Indicators Constant    Pain Type Chronic pain    Pain Onset More than a month ago    Pain Frequency Constant    Multiple Pain Sites No           Capillary Blood Glucose: No results found for this or any previous visit (from the past 24 hour(s)).    Social History   Tobacco Use  Smoking Status Current Every Day Smoker  . Packs/day: 0.25  . Years: 46.00  . Pack years: 11.50  . Types: Cigarettes  Smokeless Tobacco Former Systems developer  . Types: Snuff  Tobacco Comment   using a nicotine patch, trying to quit     Goals Met:  Independence with exercise equipment Exercise tolerated well No report of cardiac concerns or symptoms Strength training completed today  Goals Unmet:  Not Applicable  Comments: checkout time is 1430   Dr. Kathie Dike is Medical Director for [facility] Pulmonary Rehab.

## 2020-12-06 ENCOUNTER — Encounter (HOSPITAL_COMMUNITY)
Admission: RE | Admit: 2020-12-06 | Discharge: 2020-12-06 | Disposition: A | Discharge status: Home / Self Care | Payer: No Typology Code available for payment source | Source: Ambulatory Visit | Attending: Critical Care Medicine | Admitting: Critical Care Medicine

## 2020-12-06 ENCOUNTER — Other Ambulatory Visit: Payer: Self-pay

## 2020-12-06 DIAGNOSIS — J449 Chronic obstructive pulmonary disease, unspecified: Secondary | ICD-10-CM

## 2020-12-06 NOTE — Progress Notes (Signed)
Daily Session Note  Patient Details  Name: Glen Cobb MRN: [id number] Date of Birth: 01/24/1956 Referring Provider:   Flowsheet Row PULMONARY REHAB COPD ORIENTATION from 10/17/2020 in [place]  Referring Provider Dr. Rosaria Ferries      Encounter Date: 12/06/2020  Check In:  Session Check In - 12/06/20 1330      Check-In   Supervising physician immediately available to respond to emergencies [facility] MD immediately available    Physician(s) Dr. Harrington Challenger    Location [facility]    Staff Present Cathren Harsh, MS, Exercise Physiologist;Phyllis Billingsley, RN    Virtual Visit No    Medication changes reported     No    Fall or balance concerns reported    No    Tobacco Cessation No Change    Warm-up and Cool-down Performed as group-led instruction    Resistance Training Performed Yes    VAD Patient? No    PAD/SET Patient? No      Pain Assessment   Currently in Pain? Yes    Pain Score 4     Pain Location Back    Pain Orientation Lower    Pain Descriptors / Indicators Constant    Pain Type Chronic pain    Multiple Pain Sites No           Capillary Blood Glucose: No results found for this or any previous visit (from the past 24 hour(s)).    Social History   Tobacco Use  Smoking Status Current Every Day Smoker  . Packs/day: 0.25  . Years: 46.00  . Pack years: 11.50  . Types: Cigarettes  Smokeless Tobacco Former Systems developer  . Types: Snuff  Tobacco Comment   using a nicotine patch, trying to quit     Goals Met:  Independence with exercise equipment Exercise tolerated well No report of cardiac concerns or symptoms Strength training completed today  Goals Unmet:  Not Applicable  Comments: check out 1430   Dr. Kathie Dike is Medical Director for [facility] Pulmonary Rehab.

## 2020-12-08 ENCOUNTER — Other Ambulatory Visit: Payer: Self-pay

## 2020-12-08 ENCOUNTER — Encounter (HOSPITAL_COMMUNITY)
Admission: RE | Admit: 2020-12-08 | Discharge: 2020-12-08 | Disposition: A | Discharge status: Home / Self Care | Payer: No Typology Code available for payment source | Source: Ambulatory Visit | Attending: Critical Care Medicine | Admitting: Critical Care Medicine

## 2020-12-08 DIAGNOSIS — J449 Chronic obstructive pulmonary disease, unspecified: Secondary | ICD-10-CM | POA: Diagnosis not present

## 2020-12-08 NOTE — Progress Notes (Signed)
Daily Session Note  Patient Details  Name: Glen Cobb MRN: [id number] Date of Birth: [birth year] Referring Provider:   Flowsheet Row PULMONARY REHAB COPD ORIENTATION from 10/17/2020 in [place]  Referring Provider Dr. Rosaria Ferries      Encounter Date: 12/08/2020  Check In:  Session Check In - 12/08/20 1330      Check-In   Supervising physician immediately available to respond to emergencies [facility] MD immediately available    Physician(s) Dr. Domenic Polite    Location [facility]    Staff Present Geanie Cooley, RN;Madison Audria Nine, MS, Exercise Physiologist;[place] Kris Mouton, MS, ACSM-CEP, Exercise Physiologist    Virtual Visit No    Medication changes reported     No    Fall or balance concerns reported    No    Tobacco Cessation No Change    Warm-up and Cool-down Performed as group-led instruction    Resistance Training Performed Yes    VAD Patient? No    PAD/SET Patient? No      Pain Assessment   Currently in Pain? Yes    Pain Score 5     Pain Location Back    Pain Orientation Lower    Pain Descriptors / Indicators Constant    Pain Type Chronic pain    Pain Onset More than a month ago    Pain Frequency Constant    Multiple Pain Sites Yes      2nd Pain Site   Pain Score 5    Pain Type Chronic pain    Pain Location Leg    Pain Orientation Left    Pain Descriptors / Indicators Constant    Pain Frequency Constant      Pain   Pain Onset More than a month ago           Capillary Blood Glucose: No results found for this or any previous visit (from the past 24 hour(s)).    Social History   Tobacco Use  Smoking Status Current Every Day Smoker  . Packs/day: 0.25  . Years: 46.00  . Pack years: 11.50  . Types: Cigarettes  Smokeless Tobacco Former Systems developer  . Types: Snuff  Tobacco Comment   using a nicotine patch, trying to quit     Goals Met:  Proper associated with RPD/PD & O2 Sat Independence with exercise  equipment Exercise tolerated well No report of cardiac concerns or symptoms Strength training completed today  Goals Unmet:  Not Applicable  Comments: check out @ 14:30   Dr. Kathie Dike is Medical Director for [facility] Pulmonary Rehab.

## 2020-12-13 ENCOUNTER — Encounter (HOSPITAL_COMMUNITY)
Admission: RE | Admit: 2020-12-13 | Discharge: 2020-12-13 | Disposition: A | Discharge status: Home / Self Care | Payer: No Typology Code available for payment source | Source: Ambulatory Visit | Attending: Critical Care Medicine | Admitting: Critical Care Medicine

## 2020-12-13 ENCOUNTER — Other Ambulatory Visit: Payer: Self-pay

## 2020-12-13 VITALS — Wt 162.7 lb

## 2020-12-13 DIAGNOSIS — J449 Chronic obstructive pulmonary disease, unspecified: Secondary | ICD-10-CM | POA: Diagnosis not present

## 2020-12-13 NOTE — Progress Notes (Signed)
Daily Session Note  Patient Details  Name: Glen Cobb MRN: [id number] Date of Birth: [birth year] Referring Provider:   Flowsheet Row PULMONARY REHAB COPD ORIENTATION from 10/17/2020 in [place]  Referring Provider Dr. Rosaria Ferries      Encounter Date: 12/13/2020  Check In:  Session Check In - 12/13/20 1330      Check-In   Supervising physician immediately available to respond to emergencies [facility] MD immediately available    Physician(s) Dr. Harl Bowie    Location [facility]    Staff Present Geanie Cooley, RN;Madison Audria Nine, MS, Exercise Physiologist;Dalton Kris Mouton, MS, ACSM-CEP, Exercise Physiologist    Virtual Visit No    Medication changes reported     No    Fall or balance concerns reported    No    Tobacco Cessation No Change    Warm-up and Cool-down Performed as group-led instruction    Resistance Training Performed Yes    VAD Patient? No    PAD/SET Patient? No      Pain Assessment   Currently in Pain? No/denies    Multiple Pain Sites No           Capillary Blood Glucose: No results found for this or any previous visit (from the past 24 hour(s)).    Social History   Tobacco Use  Smoking Status Current Every Day Smoker  . Packs/day: 0.25  . Years: 46.00  . Pack years: 11.50  . Types: Cigarettes  Smokeless Tobacco Former Systems developer  . Types: Snuff  Tobacco Comment   using a nicotine patch, trying to quit     Goals Met:  Independence with exercise equipment Exercise tolerated well No report of cardiac concerns or symptoms Strength training completed today  Goals Unmet:  Not Applicable  Comments: checkout time Is 1430   Dr. Kathie Dike is Medical Director for [facility].

## 2020-12-15 ENCOUNTER — Encounter (HOSPITAL_COMMUNITY)
Admission: RE | Admit: 2020-12-15 | Discharge: 2020-12-15 | Disposition: A | Discharge status: Home / Self Care | Payer: No Typology Code available for payment source | Source: Ambulatory Visit | Attending: Critical Care Medicine | Admitting: Critical Care Medicine

## 2020-12-15 ENCOUNTER — Other Ambulatory Visit: Payer: Self-pay

## 2020-12-15 DIAGNOSIS — J449 Chronic obstructive pulmonary disease, unspecified: Secondary | ICD-10-CM

## 2020-12-15 NOTE — Progress Notes (Signed)
Daily Session Note  Patient Details  Name: Glen Cobb MRN: [id number] Date of Birth: 1956-02-12 Referring Provider:   Flowsheet Row PULMONARY REHAB COPD ORIENTATION from 10/17/2020 in [place]  Referring Provider Dr. Rosaria Ferries      Encounter Date: 12/15/2020  Check In:  Session Check In - 12/15/20 1330      Check-In   Supervising physician immediately available to respond to emergencies [facility] MD immediately available    Physician(s) Dr. Harl Bowie    Location [facility]    Staff Present Aundra Dubin, RN, BSN;Madison Audria Nine, MS, Exercise Physiologist;Dalton Kris Mouton, MS, ACSM-CEP, Exercise Physiologist    Virtual Visit No    Medication changes reported     No    Fall or balance concerns reported    No    Tobacco Cessation No Change    Warm-up and Cool-down Performed as group-led instruction    Resistance Training Performed Yes    VAD Patient? No    PAD/SET Patient? No      Pain Assessment   Currently in Pain? No/denies    Pain Score 0-No pain    Multiple Pain Sites No           Capillary Blood Glucose: No results found for this or any previous visit (from the past 24 hour(s)).    Social History   Tobacco Use  Smoking Status Current Every Day Smoker  . Packs/day: 0.25  . Years: 46.00  . Pack years: 11.50  . Types: Cigarettes  Smokeless Tobacco Former Systems developer  . Types: Snuff  Tobacco Comment   using a nicotine patch, trying to quit     Goals Met:  Proper associated with RPD/PD & O2 Sat Independence with exercise equipment Improved SOB with ADL's Using PLB without cueing & demonstrates good technique Exercise tolerated well No report of cardiac concerns or symptoms Strength training completed today  Goals Unmet:  Not Applicable  Comments: Check out 1430.   Dr. Kathie Dike is Medical Director for [facility].

## 2020-12-20 ENCOUNTER — Other Ambulatory Visit: Payer: Self-pay

## 2020-12-20 ENCOUNTER — Encounter (HOSPITAL_COMMUNITY)
Admission: RE | Admit: 2020-12-20 | Discharge: 2020-12-20 | Disposition: A | Discharge status: Home / Self Care | Payer: No Typology Code available for payment source | Source: Ambulatory Visit | Attending: Critical Care Medicine | Admitting: Critical Care Medicine

## 2020-12-20 DIAGNOSIS — J449 Chronic obstructive pulmonary disease, unspecified: Secondary | ICD-10-CM | POA: Diagnosis not present

## 2020-12-20 NOTE — Progress Notes (Signed)
Daily Session Note  Patient Details  Name: Glen Cobb MRN: [id number] Date of Birth: January 16, 1956 Referring Provider:   Flowsheet Row PULMONARY REHAB COPD ORIENTATION from 10/17/2020 in [place]  Referring Provider Dr. Rosaria Ferries      Encounter Date: 12/20/2020  Check In:  Session Check In - 12/20/20 1330      Check-In   Supervising physician immediately available to respond to emergencies [facility] MD immediately available    Physician(s) Dr. Domenic Polite    Location [facility]    Staff Present Cathren Harsh, MS, Exercise Physiologist;Phyllis Billingsley, Edison Simon, MS, ACSM-CEP, Exercise Physiologist    Virtual Visit No    Medication changes reported     No    Fall or balance concerns reported    No    Tobacco Cessation No Change    Warm-up and Cool-down Performed as group-led instruction    Resistance Training Performed Yes    VAD Patient? No    PAD/SET Patient? No      Pain Assessment   Currently in Pain? Yes    Pain Score 5     Pain Location Back    Pain Orientation Lower    Multiple Pain Sites No           Capillary Blood Glucose: No results found for this or any previous visit (from the past 24 hour(s)).    Social History   Tobacco Use  Smoking Status Current Every Day Smoker  . Packs/day: 0.25  . Years: 46.00  . Pack years: 11.50  . Types: Cigarettes  Smokeless Tobacco Former Systems developer  . Types: Snuff  Tobacco Comment   using a nicotine patch, trying to quit     Goals Met:  Independence with exercise equipment Exercise tolerated well No report of cardiac concerns or symptoms Strength training completed today  Goals Unmet:  Not Applicable  Comments: check out 1430   Dr. Kathie Dike is Medical Director for [facility].

## 2020-12-22 ENCOUNTER — Encounter (HOSPITAL_COMMUNITY)
Admission: RE | Admit: 2020-12-22 | Discharge: 2020-12-22 | Disposition: A | Discharge status: Home / Self Care | Payer: No Typology Code available for payment source | Source: Ambulatory Visit | Attending: Critical Care Medicine | Admitting: Critical Care Medicine

## 2020-12-22 ENCOUNTER — Other Ambulatory Visit: Payer: Self-pay

## 2020-12-22 DIAGNOSIS — J449 Chronic obstructive pulmonary disease, unspecified: Secondary | ICD-10-CM | POA: Diagnosis not present

## 2020-12-22 NOTE — Progress Notes (Signed)
Daily Session Note  Patient Details  Name: Glen Cobb MRN: [id number] Date of Birth: 04/27/1956 Referring Provider:   Flowsheet Row PULMONARY REHAB COPD ORIENTATION from 10/17/2020 in [place]  Referring Provider Dr. Rosaria Ferries      Encounter Date: 12/22/2020  Check In:  Session Check In - 12/22/20 1330      Check-In   Supervising physician immediately available to respond to emergencies [facility] MD immediately available    Physician(s) Dr. Domenic Polite    Location [facility]    Staff Present Aundra Dubin, RN, BSN;Madison Audria Nine, MS, Exercise Physiologist    Virtual Visit No    Medication changes reported     No    Fall or balance concerns reported    No    Tobacco Cessation No Change    Warm-up and Cool-down Performed as group-led instruction    Resistance Training Performed Yes    VAD Patient? No    PAD/SET Patient? No      Pain Assessment   Currently in Pain? Yes    Pain Score 5     Pain Location Back    Pain Orientation Lower    Pain Descriptors / Indicators Constant    Pain Type Chronic pain    Pain Onset More than a month ago    Pain Frequency Constant    Multiple Pain Sites Yes      3rd Pain Site   Pain Score 4    Pain Location Knee    Pain Orientation Left    Pain Descriptors / Indicators Aching    Pain Type Chronic pain    Pain Onset In the past 7 days           Capillary Blood Glucose: No results found for this or any previous visit (from the past 24 hour(s)).    Social History   Tobacco Use  Smoking Status Current Every Day Smoker  . Packs/day: 0.25  . Years: 46.00  . Pack years: 11.50  . Types: Cigarettes  Smokeless Tobacco Former Systems developer  . Types: Snuff  Tobacco Comment   using a nicotine patch, trying to quit     Goals Met:  Proper associated with RPD/PD & O2 Sat Independence with exercise equipment Improved SOB with ADL's Using PLB without cueing & demonstrates good technique Exercise tolerated  well No report of cardiac concerns or symptoms Strength training completed today  Goals Unmet:  Not Applicable  Comments: Check out 1430.   Dr. Kathie Dike is Medical Director for [facility] Pul[facility].

## 2020-12-27 ENCOUNTER — Encounter (HOSPITAL_COMMUNITY)
Admission: RE | Admit: 2020-12-27 | Discharge: 2020-12-27 | Disposition: A | Discharge status: Home / Self Care | Payer: No Typology Code available for payment source | Source: Ambulatory Visit | Attending: Critical Care Medicine | Admitting: Critical Care Medicine

## 2020-12-27 ENCOUNTER — Other Ambulatory Visit: Payer: Self-pay

## 2020-12-27 VITALS — Wt 173.1 lb

## 2020-12-27 DIAGNOSIS — J449 Chronic obstructive pulmonary disease, unspecified: Secondary | ICD-10-CM | POA: Insufficient documentation

## 2020-12-27 NOTE — Progress Notes (Signed)
Daily Session Note  Patient Details  Name: Glen Cobb MRN: [id number] Date of Birth: 1956-09-23 Referring Provider:   Flowsheet Row PULMONARY REHAB COPD ORIENTATION from 10/17/2020 in [place]  Referring Provider Dr. Rosaria Ferries      Encounter Date: 12/27/2020  Check In:  Session Check In - 12/27/20 1330      Check-In   Supervising physician immediately available to respond to emergencies [facility] MD immediately available    Physician(s) Dr. Harl Bowie    Location [facility]    Staff Present Cathren Harsh, MS, Exercise Physiologist;Yiannis Tulloch Kris Mouton, MS, ACSM-CEP, Exercise Physiologist;Phyllis Billingsley, RN    Virtual Visit No    Medication changes reported     No    Fall or balance concerns reported    No    Tobacco Cessation No Change    Warm-up and Cool-down Performed as group-led instruction    Resistance Training Performed Yes    VAD Patient? No    PAD/SET Patient? No      Pain Assessment   Currently in Pain? Yes    Pain Score 5     Pain Location Back    Pain Orientation Lower    Pain Descriptors / Indicators Constant    Pain Type Chronic pain    Pain Onset More than a month ago    Pain Frequency Constant    Aggravating Factors  standing or sitting too long    Pain Relieving Factors cortisone injections    Multiple Pain Sites Yes    Aggravating Factors  weight bearing or exercise      3rd Pain Site   Pain Score 4    Pain Type Chronic pain    Pain Location Knee    Pain Orientation Left    Pain Descriptors / Indicators Aching    Pain Frequency Constant      Pain   Pain Onset 1 to 4 weeks ago           Capillary Blood Glucose: No results found for this or any previous visit (from the past 24 hour(s)).    Social History   Tobacco Use  Smoking Status Current Every Day Smoker  . Packs/day: 0.25  . Years: 46.00  . Pack years: 11.50  . Types: Cigarettes  Smokeless Tobacco Former Systems developer  . Types: Snuff  Tobacco Comment    using a nicotine patch, trying to quit     Goals Met:  Independence with exercise equipment Exercise tolerated well No report of cardiac concerns or symptoms Strength training completed today  Goals Unmet:  Not Applicable  Comments: checkout time is 1430   Dr. Kathie Dike is Medical Director for [facility].

## 2020-12-28 NOTE — Progress Notes (Signed)
Pulmonary Individual Treatment Plan  Patient Details  Name: Glen Cobb MRN: [id number] Date of Birth: 1955-10-17 Referring Provider:   Flowsheet Row PULMONARY REHAB COPD ORIENTATION from 10/17/2020 in [place]  Referring Provider Dr. Rosaria Ferries      Initial Encounter Date:  Flowsheet Row PULMONARY REHAB COPD ORIENTATION from 10/17/2020 in [place]  Date 10/17/20      Visit Diagnosis: Chronic obstructive pulmonary disease, unspecified COPD type ([place])  Patient's Home Medications on Admission:   Current Outpatient Medications:  .  Acidophilus Lactobacillus CAPS, Take 2 tablets by mouth daily at 12 noon., Disp: , Rfl:  .  acyclovir (ZOVIRAX) 400 MG tablet, Take 400 mg by mouth 2 (two) times daily as needed (for flareup)., Disp: , Rfl:  .  albuterol-ipratropium (COMBIVENT) 18-103 MCG/ACT inhaler, Inhale 1 puff into the lungs every 6 (six) hours as needed., Disp: , Rfl:  .  allopurinol (ZYLOPRIM) 100 MG tablet, Take 100 mg by mouth daily., Disp: , Rfl:  .  amLODipine (NORVASC) 5 MG tablet, Take 10 mg by mouth daily., Disp: , Rfl:  .  budesonide-formoterol (SYMBICORT) 160-4.5 MCG/ACT inhaler, Inhale 2 puffs into the lungs 2 (two) times daily., Disp: , Rfl:  .  cyclobenzaprine (FLEXERIL) 10 MG tablet, Take 15 mg by mouth at bedtime., Disp: , Rfl:  .  gabapentin (NEURONTIN) 600 MG tablet, Take 1,200 mg by mouth 3 (three) times daily., Disp: , Rfl:  .  hydrOXYzine (VISTARIL) 25 MG capsule, Take 25 mg by mouth every 6 (six) hours as needed for itching (sleep)., Disp: , Rfl:  .  lidocaine (LIDODERM) 5 %, Place 1 patch onto the skin daily. Remove & Discard patch within 12 hours or as directed by MD, Disp: , Rfl:  .  omeprazole (PRILOSEC) 40 MG capsule, Take 40 mg by mouth daily., Disp: , Rfl:  .  potassium chloride (KLOR-CON) 10 MEQ tablet, Take 10 mEq by mouth daily., Disp: , Rfl:  .  predniSONE (DELTASONE) 20 MG tablet, Take 3 tablets by mouth  daily x1 day; then 2 tablets by mouth daily x2 days; then 1 tablet by mouth daily x3 days; then half tablet by mouth daily x3 days and stop prednisone. (Patient taking differently: Take 3 tablets by mouth daily x1 day; then 2 tablets by mouth daily x2 days; then 1 tablet by mouth daily x3 days; then half tablet by mouth daily x3 days and stop prednisone. As needed for gout flareup), Disp: 30 tablet, Rfl: 0 .  Propylene Glycol (SYSTANE BALANCE OP), Apply 1 drop to eye 4 (four) times daily. Both eyes, Disp: , Rfl:  .  Riboflavin 100 MG TABS, Take 400 mg by mouth daily., Disp: , Rfl:  .  sildenafil (VIAGRA) 100 MG tablet, Take 50 mg by mouth daily as needed for erectile dysfunction., Disp: , Rfl:  .  Tiotropium Bromide Monohydrate (SPIRIVA RESPIMAT) 2.5 MCG/ACT AERS, Inhale 2 puffs into the lungs daily., Disp: , Rfl:  .  traMADol (ULTRAM) 50 MG tablet, Take 100 mg by mouth 3 (three) times daily as needed for moderate pain., Disp: , Rfl:   Past Medical History: Past Medical History:  Diagnosis Date  . Arthritis   . Cancer ([place])    bladder cancer x 2  . Cataract    MD just watching  . COPD (chronic obstructive pulmonary disease) ([place])   . Emphysema of lung ([place])   . FH: migraines    last one 1 yr ago - OTC  med prn  . Gout   . HSV infection   . Hyperlipidemia    no med. diet  . Hypertension   . Kidney stone    passed stone - no surgery  . Osteoporosis   . Sleep apnea    Does not use CPAP nightly.    Tobacco Use: Social History   Tobacco Use  Smoking Status Current Every Day Smoker  . Packs/day: 0.25  . Years: 46.00  . Pack years: 11.50  . Types: Cigarettes  Smokeless Tobacco Former Systems developer  . Types: Snuff  Tobacco Comment   using a nicotine patch, trying to quit     Labs: Recent Review Flowsheet Data   There is no flowsheet data to display.     Capillary Blood Glucose: Lab Results  Component Value Date   GLUCAP 191 (H) 08/22/2019   GLUCAP 145 (H) 08/20/2019   GLUCAP  182 (H) 08/19/2019     Pulmonary Assessment Scores:  Pulmonary Assessment Scores    Row Name 10/17/20 1329         ADL UCSD   ADL Phase Entry     SOB Score total 24           CAT Score   CAT Score 22           mMRC Score   mMRC Score 2           UCSD: Self-administered rating of dyspnea associated with activities of daily living (ADLs) 6-point scale (0 = "not at all" to 5 = "maximal or unable to do because of breathlessness")  Scoring Scores range from 0 to 120.  Minimally important difference is 5 units  CAT: CAT can identify the health impairment of COPD patients and is better correlated with disease progression.  CAT has a scoring range of zero to 40. The CAT score is classified into four groups of low (less than 10), medium (10 - 20), high (21-30) and very high (31-40) based on the impact level of disease on health status. A CAT score over 10 suggests significant symptoms.  A worsening CAT score could be explained by an exacerbation, poor medication adherence, poor inhaler technique, or progression of COPD or comorbid conditions.  CAT MCID is 2 points  mMRC: mMRC (Modified Medical Research Council) Dyspnea Scale is used to assess the degree of baseline functional disability in patients of respiratory disease due to dyspnea. No minimal important difference is established. A decrease in score of 1 point or greater is considered a positive change.   Pulmonary Function Assessment:  Pulmonary Function Assessment - 10/17/20 1325      Breath   Shortness of Breath Yes;Limiting activity;Fear of Shortness of Breath           Exercise Target Goals: Exercise Program Goal: Individual exercise prescription set using results from initial 6 min walk test and THRR while considering  patient's activity barriers and safety.   Exercise Prescription Goal: Initial exercise prescription builds to 30-45 minutes a day of aerobic activity, 2-3 days per week.  Home exercise guidelines  will be given to patient during program as part of exercise prescription that the participant will acknowledge.  Activity Barriers & Risk Stratification:  Activity Barriers & Cardiac Risk Stratification - 10/17/20 1322      Activity Barriers & Cardiac Risk Stratification   Activity Barriers Arthritis;Back Problems;Neck/Spine Problems;Joint Problems;Deconditioning;Muscular Weakness;Shortness of Breath;Balance Concerns;History of Falls    Cardiac Risk Stratification Moderate  6 Minute Walk:  6 Minute Walk    Row Name 10/17/20 1459         6 Minute Walk   Phase Initial     Distance 1100 feet     Walk Time 6 minutes     # of Rest Breaks 0     MPH 2.08     METS 3.14     RPE 9     Perceived Dyspnea  11     VO2 Peak 10.99     Symptoms Yes (comment)     Comments lower back pain 5/10, left foot pain 3/10, right hip pain 3/10     Resting HR 77 bpm     Resting BP 132/78     Resting Oxygen Saturation  94 %     Exercise Oxygen Saturation  during 6 min walk 88 %     Max Ex. HR 94 bpm     Max Ex. BP 142/78     2 Minute Post BP 128/76           Interval HR   1 Minute HR 92     2 Minute HR 91     3 Minute HR 92     4 Minute HR 92     5 Minute HR 94     6 Minute HR 90     2 Minute Post HR 87     Interval Heart Rate? Yes           Interval Oxygen   Interval Oxygen? Yes     Baseline Oxygen Saturation % 94 %     1 Minute Oxygen Saturation % 92 %     1 Minute Liters of Oxygen 0 L     2 Minute Oxygen Saturation % 92 %     2 Minute Liters of Oxygen 0 L     3 Minute Oxygen Saturation % 91 %     3 Minute Liters of Oxygen 0 L     4 Minute Oxygen Saturation % 91 %     4 Minute Liters of Oxygen 0 L     5 Minute Oxygen Saturation % 89 %     5 Minute Liters of Oxygen 0 L     6 Minute Oxygen Saturation % 88 %     6 Minute Liters of Oxygen 0 L     2 Minute Post Oxygen Saturation % 97 %     2 Minute Post Liters of Oxygen 0 L            Oxygen Initial Assessment:   Oxygen Initial Assessment - 10/17/20 1458      Initial 6 min Walk   Oxygen Used None      Program Oxygen Prescription   Program Oxygen Prescription None      Intervention   Short Term Goals To learn and exhibit compliance with exercise, home and travel O2 prescription;To learn and understand importance of monitoring SPO2 with pulse oximeter and demonstrate accurate use of the pulse oximeter.;To learn and understand importance of maintaining oxygen saturations>88%;To learn and demonstrate proper pursed lip breathing techniques or other breathing techniques.;To learn and demonstrate proper use of respiratory medications    Long  Term Goals Exhibits compliance with exercise, home and travel O2 prescription;Verbalizes importance of monitoring SPO2 with pulse oximeter and return demonstration;Maintenance of O2 saturations>88%;Exhibits proper breathing techniques, such as pursed lip breathing or other method taught during program session;Compliance with respiratory medication;Demonstrates proper  use of MDI's           Oxygen Re-Evaluation:  Oxygen Re-Evaluation    Row Name 11/01/20 0747 11/29/20 1455 12/27/20 1541         Program Oxygen Prescription   Program Oxygen Prescription None None None           Home Oxygen   Home Oxygen Device None None None     Sleep Oxygen Prescription None None None     Home Exercise Oxygen Prescription None None None     Home Resting Oxygen Prescription None None None     Compliance with Home Oxygen Use Yes Yes Yes           Goals/Expected Outcomes   Short Term Goals To learn and exhibit compliance with exercise, home and travel O2 prescription;To learn and understand importance of monitoring SPO2 with pulse oximeter and demonstrate accurate use of the pulse oximeter.;To learn and understand importance of maintaining oxygen saturations>88%;To learn and demonstrate proper pursed lip breathing techniques or other breathing techniques.;To learn and  demonstrate proper use of respiratory medications To learn and exhibit compliance with exercise, home and travel O2 prescription;To learn and understand importance of monitoring SPO2 with pulse oximeter and demonstrate accurate use of the pulse oximeter.;To learn and understand importance of maintaining oxygen saturations>88%;To learn and demonstrate proper pursed lip breathing techniques or other breathing techniques.;To learn and demonstrate proper use of respiratory medications To learn and exhibit compliance with exercise, home and travel O2 prescription;To learn and understand importance of monitoring SPO2 with pulse oximeter and demonstrate accurate use of the pulse oximeter.;To learn and understand importance of maintaining oxygen saturations>88%;To learn and demonstrate proper pursed lip breathing techniques or other breathing techniques.;To learn and demonstrate proper use of respiratory medications     Long  Term Goals Exhibits compliance with exercise, home and travel O2 prescription;Verbalizes importance of monitoring SPO2 with pulse oximeter and return demonstration;Maintenance of O2 saturations>88%;Exhibits proper breathing techniques, such as pursed lip breathing or other method taught during program session;Compliance with respiratory medication;Demonstrates proper use of MDI's Exhibits compliance with exercise, home and travel O2 prescription;Verbalizes importance of monitoring SPO2 with pulse oximeter and return demonstration;Maintenance of O2 saturations>88%;Exhibits proper breathing techniques, such as pursed lip breathing or other method taught during program session;Compliance with respiratory medication;Demonstrates proper use of MDI's Exhibits compliance with exercise, home and travel O2 prescription;Verbalizes importance of monitoring SPO2 with pulse oximeter and return demonstration;Maintenance of O2 saturations>88%;Exhibits proper breathing techniques, such as pursed lip breathing or  other method taught during program session;Compliance with respiratory medication;Demonstrates proper use of MDI's     Goals/Expected Outcomes Compliance Compliance Compliance            Oxygen Discharge (Final Oxygen Re-Evaluation):  Oxygen Re-Evaluation - 12/27/20 1541      Program Oxygen Prescription   Program Oxygen Prescription None      Home Oxygen   Home Oxygen Device None    Sleep Oxygen Prescription None    Home Exercise Oxygen Prescription None    Home Resting Oxygen Prescription None    Compliance with Home Oxygen Use Yes      Goals/Expected Outcomes   Short Term Goals To learn and exhibit compliance with exercise, home and travel O2 prescription;To learn and understand importance of monitoring SPO2 with pulse oximeter and demonstrate accurate use of the pulse oximeter.;To learn and understand importance of maintaining oxygen saturations>88%;To learn and demonstrate proper pursed lip breathing techniques or other breathing techniques.;To learn and demonstrate  proper use of respiratory medications    Long  Term Goals Exhibits compliance with exercise, home and travel O2 prescription;Verbalizes importance of monitoring SPO2 with pulse oximeter and return demonstration;Maintenance of O2 saturations>88%;Exhibits proper breathing techniques, such as pursed lip breathing or other method taught during program session;Compliance with respiratory medication;Demonstrates proper use of MDI's    Goals/Expected Outcomes Compliance           Initial Exercise Prescription:  Initial Exercise Prescription - 10/17/20 1500      Date of Initial Exercise RX and Referring Provider   Date 10/17/20    Referring Provider Dr. Rosaria Ferries    Expected Discharge Date 01/02/21   This is his current [place]. date. We have requested it be extended.     NuStep   Level 1    SPM 60    Minutes 39      Prescription Details   Frequency (times per week) 2    Duration Progress to 30 minutes of continuous  aerobic without signs/symptoms of physical distress      Intensity   THRR 40-80% of Max Heartrate 63-126    Ratings of Perceived Exertion 11-13    Perceived Dyspnea 0-4      Progression   Progression Continue progressive overload as per policy without signs/symptoms or physical distress.      Resistance Training   Training Prescription Yes    Weight 3 lbs    Reps 10-15           Perform Capillary Blood Glucose checks as needed.  Exercise Prescription Changes:   Exercise Prescription Changes    Row Name 11/02/20 0700 11/10/20 1400 11/29/20 1400 12/13/20 1500 12/27/20 1500     Response to Exercise   Blood Pressure (Admit) 142/80 142/72 122/74 124/70 128/62   Blood Pressure (Exercise) 160/78 170/76 150/76 162/82 164/62   Blood Pressure (Exit) 144/82 120/60 124/76 110/62 118/58   Heart Rate (Admit) 83 bpm 84 bpm 85 bpm 94 bpm 70 bpm   Heart Rate (Exercise) 93 bpm 104 bpm 107 bpm 108 bpm 110 bpm   Heart Rate (Exit) 88 bpm 89 bpm 93 bpm 93 bpm 90 bpm   Oxygen Saturation (Admit) 97 % 95 % 96 % 96 % 97 %   Oxygen Saturation (Exercise) 95 % 96 % 93 % 95 % 95 %   Oxygen Saturation (Exit) 95 % 94 % 94 % 94 % 96 %   Rating of Perceived Exertion (Exercise) 11 10 10 9 9    Perceived Dyspnea (Exercise) 12 10 10 9 9    Duration Continue with 30 min of aerobic exercise without signs/symptoms of physical distress. Continue with 30 min of aerobic exercise without signs/symptoms of physical distress. Continue with 30 min of aerobic exercise without signs/symptoms of physical distress. Continue with 30 min of aerobic exercise without signs/symptoms of physical distress. Continue with 30 min of aerobic exercise without signs/symptoms of physical distress.   Intensity THRR unchanged THRR unchanged THRR unchanged THRR unchanged THRR unchanged     Progression   Progression Continue to progress workloads to maintain intensity without signs/symptoms of physical distress. Continue to progress workloads  to maintain intensity without signs/symptoms of physical distress. Continue to progress workloads to maintain intensity without signs/symptoms of physical distress. Continue to progress workloads to maintain intensity without signs/symptoms of physical distress. Continue to progress workloads to maintain intensity without signs/symptoms of physical distress.     Resistance Training   Training Prescription Yes Yes Yes Yes Yes  Weight 3 lbs 5 5 lbs 5 lbs 5 lbs   Reps 10-15 10-15 10-15 10-15 10-15   Time 10 Minutes 10 Minutes 10 Minutes 10 Minutes 10 Minutes     NuStep   Level 2 3 3 3 4    SPM 109 112 116 114 109   Minutes 39 39 22 22 22    METs 2.2 2.4 2.5 2.4 2.5     Recumbant Elliptical   Level -- -- 1 3 4    RPM -- -- 66 75 65   Minutes -- -- 17 17 17    METs -- -- 4.9 5.7 4.8     Home Exercise Plan   Plans to continue exercise at -- Home (comment) -- -- --   Frequency -- Add 2 additional days to program exercise sessions. -- -- --   Initial Home Exercises Provided -- 11/10/20 -- -- --          Exercise Comments:   Exercise Comments    Row Name 10/20/20 1441 11/10/20 1438         Exercise Comments Patient completed first exercise session today. He tolerated exercise well and was setting goals for himself to complete 1 mile on each station. He is excited to come back to rehab and continue the program. Reviewed home exercise with patient today. He is currently exercising at home by walking a little bit and doing some exercises he learned from physical therapy. He is going to start walking more and is going to ask a family member to walk with him.             Exercise Goals and Review:   Exercise Goals    Row Name 10/17/20 1506 11/02/20 0744 11/29/20 1457 12/27/20 1543       Exercise Goals   Increase Physical Activity Yes Yes Yes Yes    Intervention Provide advice, education, support and counseling about physical activity/exercise needs.;Develop an individualized exercise  prescription for aerobic and resistive training based on initial evaluation findings, risk stratification, comorbidities and participant's personal goals. Provide advice, education, support and counseling about physical activity/exercise needs.;Develop an individualized exercise prescription for aerobic and resistive training based on initial evaluation findings, risk stratification, comorbidities and participant's personal goals. Provide advice, education, support and counseling about physical activity/exercise needs.;Develop an individualized exercise prescription for aerobic and resistive training based on initial evaluation findings, risk stratification, comorbidities and participant's personal goals. Provide advice, education, support and counseling about physical activity/exercise needs.;Develop an individualized exercise prescription for aerobic and resistive training based on initial evaluation findings, risk stratification, comorbidities and participant's personal goals.    Expected Outcomes Short Term: Attend rehab on a regular basis to increase amount of physical activity.;Long Term: Add in home exercise to make exercise part of routine and to increase amount of physical activity.;Long Term: Exercising regularly at least 3-5 days a week. Short Term: Attend rehab on a regular basis to increase amount of physical activity.;Long Term: Add in home exercise to make exercise part of routine and to increase amount of physical activity.;Long Term: Exercising regularly at least 3-5 days a week. Short Term: Attend rehab on a regular basis to increase amount of physical activity.;Long Term: Add in home exercise to make exercise part of routine and to increase amount of physical activity.;Long Term: Exercising regularly at least 3-5 days a week. Short Term: Attend rehab on a regular basis to increase amount of physical activity.;Long Term: Add in home exercise to make exercise part of routine and to  increase amount  of physical activity.;Long Term: Exercising regularly at least 3-5 days a week.    Increase Strength and Stamina Yes Yes Yes Yes    Intervention Provide advice, education, support and counseling about physical activity/exercise needs.;Develop an individualized exercise prescription for aerobic and resistive training based on initial evaluation findings, risk stratification, comorbidities and participant's personal goals. Provide advice, education, support and counseling about physical activity/exercise needs.;Develop an individualized exercise prescription for aerobic and resistive training based on initial evaluation findings, risk stratification, comorbidities and participant's personal goals. Provide advice, education, support and counseling about physical activity/exercise needs.;Develop an individualized exercise prescription for aerobic and resistive training based on initial evaluation findings, risk stratification, comorbidities and participant's personal goals. Provide advice, education, support and counseling about physical activity/exercise needs.;Develop an individualized exercise prescription for aerobic and resistive training based on initial evaluation findings, risk stratification, comorbidities and participant's personal goals.    Expected Outcomes Short Term: Increase workloads from initial exercise prescription for resistance, speed, and METs.;Short Term: Perform resistance training exercises routinely during rehab and add in resistance training at home;Long Term: Improve cardiorespiratory fitness, muscular endurance and strength as measured by increased METs and functional capacity (6MWT) Short Term: Increase workloads from initial exercise prescription for resistance, speed, and METs.;Short Term: Perform resistance training exercises routinely during rehab and add in resistance training at home;Long Term: Improve cardiorespiratory fitness, muscular endurance and strength as measured by  increased METs and functional capacity (6MWT) Short Term: Increase workloads from initial exercise prescription for resistance, speed, and METs.;Short Term: Perform resistance training exercises routinely during rehab and add in resistance training at home;Long Term: Improve cardiorespiratory fitness, muscular endurance and strength as measured by increased METs and functional capacity (6MWT) Short Term: Increase workloads from initial exercise prescription for resistance, speed, and METs.;Short Term: Perform resistance training exercises routinely during rehab and add in resistance training at home;Long Term: Improve cardiorespiratory fitness, muscular endurance and strength as measured by increased METs and functional capacity (6MWT)    Able to understand and use rate of perceived exertion (RPE) scale Yes Yes Yes Yes    Intervention Provide education and explanation on how to use RPE scale Provide education and explanation on how to use RPE scale Provide education and explanation on how to use RPE scale Provide education and explanation on how to use RPE scale    Expected Outcomes Short Term: Able to use RPE daily in rehab to express subjective intensity level;Long Term:  Able to use RPE to guide intensity level when exercising independently Short Term: Able to use RPE daily in rehab to express subjective intensity level;Long Term:  Able to use RPE to guide intensity level when exercising independently Short Term: Able to use RPE daily in rehab to express subjective intensity level;Long Term:  Able to use RPE to guide intensity level when exercising independently Short Term: Able to use RPE daily in rehab to express subjective intensity level;Long Term:  Able to use RPE to guide intensity level when exercising independently    Able to understand and use Dyspnea scale Yes Yes Yes Yes    Intervention Provide education and explanation on how to use Dyspnea scale Provide education and explanation on how to use  Dyspnea scale Provide education and explanation on how to use Dyspnea scale Provide education and explanation on how to use Dyspnea scale    Expected Outcomes Short Term: Able to use Dyspnea scale daily in rehab to express subjective sense of shortness of breath during exertion;Long Term:  Able to use Dyspnea scale to guide intensity level when exercising independently Short Term: Able to use Dyspnea scale daily in rehab to express subjective sense of shortness of breath during exertion;Long Term: Able to use Dyspnea scale to guide intensity level when exercising independently Short Term: Able to use Dyspnea scale daily in rehab to express subjective sense of shortness of breath during exertion;Long Term: Able to use Dyspnea scale to guide intensity level when exercising independently Short Term: Able to use Dyspnea scale daily in rehab to express subjective sense of shortness of breath during exertion;Long Term: Able to use Dyspnea scale to guide intensity level when exercising independently    Knowledge and understanding of Target Heart Rate Range (THRR) Yes Yes Yes Yes    Intervention Provide education and explanation of THRR including how the numbers were predicted and where they are located for reference Provide education and explanation of THRR including how the numbers were predicted and where they are located for reference Provide education and explanation of THRR including how the numbers were predicted and where they are located for reference Provide education and explanation of THRR including how the numbers were predicted and where they are located for reference    Expected Outcomes Short Term: Able to state/look up THRR;Long Term: Able to use THRR to govern intensity when exercising independently;Short Term: Able to use daily as guideline for intensity in rehab Short Term: Able to state/look up THRR;Long Term: Able to use THRR to govern intensity when exercising independently;Short Term: Able to use  daily as guideline for intensity in rehab Short Term: Able to state/look up THRR;Long Term: Able to use THRR to govern intensity when exercising independently;Short Term: Able to use daily as guideline for intensity in rehab Short Term: Able to state/look up THRR;Long Term: Able to use THRR to govern intensity when exercising independently;Short Term: Able to use daily as guideline for intensity in rehab    Understanding of Exercise Prescription Yes Yes Yes Yes    Intervention Provide education, explanation, and written materials on patient's individual exercise prescription Provide education, explanation, and written materials on patient's individual exercise prescription Provide education, explanation, and written materials on patient's individual exercise prescription Provide education, explanation, and written materials on patient's individual exercise prescription    Expected Outcomes Short Term: Able to explain program exercise prescription;Long Term: Able to explain home exercise prescription to exercise independently Short Term: Able to explain program exercise prescription;Long Term: Able to explain home exercise prescription to exercise independently Short Term: Able to explain program exercise prescription;Long Term: Able to explain home exercise prescription to exercise independently Short Term: Able to explain program exercise prescription;Long Term: Able to explain home exercise prescription to exercise independently           Exercise Goals Re-Evaluation :  Exercise Goals Re-Evaluation    Row Name 11/01/20 0744 11/29/20 1458 12/27/20 1544         Exercise Goal Re-Evaluation   Exercise Goals Review Increase Strength and Stamina;Increase Physical Activity;Able to understand and use rate of perceived exertion (RPE) scale;Knowledge and understanding of Target Heart Rate Range (THRR);Able to check pulse independently;Understanding of Exercise Prescription Increase Strength and  Stamina;Increase Physical Activity;Able to understand and use rate of perceived exertion (RPE) scale;Knowledge and understanding of Target Heart Rate Range (THRR);Able to check pulse independently;Understanding of Exercise Prescription Increase Strength and Stamina;Increase Physical Activity;Able to understand and use rate of perceived exertion (RPE) scale;Knowledge and understanding of Target Heart Rate Range (THRR);Able to check pulse  independently;Understanding of Exercise Prescription     Comments Patient has completed 4 exercise sessions. He has tolerated exercise well and has settled in to the routine. He keeps setting goals for himself to complete on the NuStep. He is progressing quickly. He is currently exercising at 2.2 METs on the NuStep. Will continue to monitor and progress as able. Patient has completed 11 sessions of pulmonary rehab. He has been able to tolerate exercise well has been progressing through the program. He has been able to use the elliptical for one of his stations now which is more challenging for him. He is currently exercising at 2.5 METs on the stepper. Will continue to monitor and progress as able. Pt has completed 19 rehab sessions. He continues to progress well and continues to increase his workloads. He is currently exercising at 2.5 METs on the stepper. Will continue to monitor and progress as able.     Expected Outcomes Through exercise at rehab and with a home exercise program, patient will achieve their goals. Through exercise at rehab and with a home exercise program, patient will achieve their goals. Through exercise at rehab and with a home exercise program, patient will achieve their goals.            Discharge Exercise Prescription (Final Exercise Prescription Changes):  Exercise Prescription Changes - 12/27/20 1500      Response to Exercise   Blood Pressure (Admit) 128/62    Blood Pressure (Exercise) 164/62    Blood Pressure (Exit) 118/58    Heart Rate  (Admit) 70 bpm    Heart Rate (Exercise) 110 bpm    Heart Rate (Exit) 90 bpm    Oxygen Saturation (Admit) 97 %    Oxygen Saturation (Exercise) 95 %    Oxygen Saturation (Exit) 96 %    Rating of Perceived Exertion (Exercise) 9    Perceived Dyspnea (Exercise) 9    Duration Continue with 30 min of aerobic exercise without signs/symptoms of physical distress.    Intensity THRR unchanged      Progression   Progression Continue to progress workloads to maintain intensity without signs/symptoms of physical distress.      Resistance Training   Training Prescription Yes    Weight 5 lbs    Reps 10-15    Time 10 Minutes      NuStep   Level 4    SPM 109    Minutes 22    METs 2.5      Recumbant Elliptical   Level 4    RPM 65    Minutes 17    METs 4.8           Nutrition:  Target Goals: Understanding of nutrition guidelines, daily intake of sodium 1500mg , cholesterol 200mg , calories 30% from fat and 7% or less from saturated fats, daily to have 5 or more servings of fruits and vegetables.  Biometrics:  Pre Biometrics - 12/13/20 1553      Pre Biometrics   Weight 73.8 kg            Nutrition Therapy Plan and Nutrition Goals:  Nutrition Therapy & Goals - 12/21/20 0839      Personal Nutrition Goals   Comments We will continue to provide nutrition education through handouts.      Intervention Plan   Intervention Nutrition handout(s) given to patient.           Nutrition Assessments:  Nutrition Assessments - 10/17/20 1340      MEDFICTS Scores  Pre Score 62          MEDIFICTS Score Key:  ?70 Need to make dietary changes   40-70 Heart Healthy Diet  ? 40 Therapeutic Level Cholesterol Diet   Picture Your Plate Scores:  <22 Unhealthy dietary pattern with much room for improvement.  41-50 Dietary pattern unlikely to meet recommendations for good health and room for improvement.  51-60 More healthful dietary pattern, with some room for improvement.    >60 Healthy dietary pattern, although there may be some specific behaviors that could be improved.    Nutrition Goals Re-Evaluation:   Nutrition Goals Discharge (Final Nutrition Goals Re-Evaluation):   Psychosocial: Target Goals: Acknowledge presence or absence of significant depression and/or stress, maximize coping skills, provide positive support system. Participant is able to verbalize types and ability to use techniques and skills needed for reducing stress and depression.  Initial Review & Psychosocial Screening:  Initial Psych Review & Screening - 10/17/20 1318      Initial Review   Current issues with Current Sleep Concerns   He has a hard time falling asleep and staying asleep. He cannot sleep more than a couple of hours at a time. He has been tested for OSA, but he has not gotten his results back yet.     Family Dynamics   Good Support System? Yes    Comments His older brother and younger brother support him. He has some friends that he can call and talk to about his problems.      Barriers   Psychosocial barriers to participate in program There are no identifiable barriers or psychosocial needs.      Screening Interventions   Interventions Encouraged to exercise    Expected Outcomes Short Term goal: Utilizing psychosocial counselor, staff and physician to assist with identification of specific Stressors or current issues interfering with healing process. Setting desired goal for each stressor or current issue identified.;Long Term goal: The participant improves quality of Life and PHQ9 Scores as seen by post scores and/or verbalization of changes           Quality of Life Scores:  Quality of Life - 10/17/20 1456      Quality of Life   Select Quality of Life      Quality of Life Scores   Health/Function Pre 21.19 %    Socioeconomic Pre 26.92 %    Psych/Spiritual Pre 24.29 %    Family Pre 25.5 %    GLOBAL Pre 23.34 %          Scores of 19 and below  usually indicate a poorer quality of life in these areas.  A difference of  2-3 points is a clinically meaningful difference.  A difference of 2-3 points in the total score of the Quality of Life Index has been associated with significant improvement in overall quality of life, self-image, physical symptoms, and general health in studies assessing change in quality of life.   PHQ-9: Recent Review Flowsheet Data    Depression screen [facility] 2/9 10/17/2020   Decreased Interest 1   Down, Depressed, Hopeless 0   PHQ - 2 Score 1   Altered sleeping 2   Tired, decreased energy 1   Change in appetite 0   Feeling bad or failure about yourself  0   Trouble concentrating 0   Moving slowly or fidgety/restless 0   Suicidal thoughts 0   PHQ-9 Score 4   Difficult doing work/chores Not difficult at all  Interpretation of Total Score  Total Score Depression Severity:  1-4 = Minimal depression, 5-9 = Mild depression, 10-14 = Moderate depression, 15-19 = Moderately severe depression, 20-27 = Severe depression   Psychosocial Evaluation and Intervention:  Psychosocial Evaluation - 10/17/20 1455      Psychosocial Evaluation & Interventions   Interventions Encouraged to exercise with the program and follow exercise prescription    Comments Patient has no identifiable psychosocial concerns at orientation.    Expected Outcomes Patient will continue to not have any identifiable psychosocial concerns throughout his time is the program.           Psychosocial Re-Evaluation:  Psychosocial Re-Evaluation    [place] Name 10/28/20 0827 11/21/20 1434 12/21/20 0835         Psychosocial Re-Evaluation   Current issues with Current Stress Concerns None Identified None Identified     Comments Patient is new to the program completing 3 sessions. His initial QOL score was 23.34 and his PHQ-9 score was 5. He is using Vistaril 25 mg q6 hours for itching and sleep but he does use it for sleep. He seems to enjoy coming to  the program. We will continue to monitor. Patient continues to have no psychosocial issues identified.  He has completed 8 sessions with consistent attendance. He demonstrates a very positive attitude and works hard during the sessions. Will continue to monitor Patient continues to have no psychosocial issues identified.  He has completed 17 sessions with consistent attendance. He demonstrates a very positive attitude and works hard during the sessions. Enjoys coming to class and interacting alot with staff and group members. Has a positive outlook and continue to work hard in [place]. Will continue to monitor     Expected Outcomes Patient's sleep with be managed at [facility] with no additional psychosocial issues identifiied. Patient will have no psychosocial issues identified at discharge. Patient will have no psychosocial issues identified at discharge.     Interventions Encouraged to attend Pulmonary Rehabilitation for the exercise;Relaxation education;Stress management education Encouraged to attend Pulmonary Rehabilitation for the exercise;Relaxation education;Stress management education Encouraged to attend Pulmonary Rehabilitation for the exercise;Relaxation education;Stress management education     Continue Psychosocial Services  No Follow up required No Follow up required No Follow up required           Initial Review   Source of Stress Concerns None Identified None Identified None Identified            Psychosocial Discharge (Final Psychosocial Re-Evaluation):  Psychosocial Re-Evaluation - 12/21/20 0835      Psychosocial Re-Evaluation   Current issues with None Identified    Comments Patient continues to have no psychosocial issues identified.  He has completed 17 sessions with consistent attendance. He demonstrates a very positive attitude and works hard during the sessions. Enjoys coming to class and interacting alot with staff and group members. Has a positive outlook and continue to work  hard in [place]. Will continue to monitor    Expected Outcomes Patient will have no psychosocial issues identified at discharge.    Interventions Encouraged to attend Pulmonary Rehabilitation for the exercise;Relaxation education;Stress management education    Continue Psychosocial Services  No Follow up required      Initial Review   Source of Stress Concerns None Identified            Education: Education Goals: Education classes will be provided on a weekly basis, covering required topics. Participant will state understanding/return demonstration of topics presented.  Learning Barriers/Preferences:  Learning Barriers/Preferences - 10/17/20 1320      Learning Barriers/Preferences   Learning Barriers None    Learning Preferences Pictoral;Skilled Demonstration           Education Topics: How Lungs Work and Diseases: - Discuss the anatomy of the lungs and diseases that can affect the lungs, such as COPD.   Exercise: -Discuss the importance of exercise, FITT principles of exercise, normal and abnormal responses to exercise, and how to exercise safely.   Environmental Irritants: -Discuss types of environmental irritants and how to limit exposure to environmental irritants.   Meds/Inhalers and oxygen: - Discuss respiratory medications, definition of an inhaler and oxygen, and the proper way to use an inhaler and oxygen.   Energy Saving Techniques: - Discuss methods to conserve energy and decrease shortness of breath when performing activities of daily living.  Flowsheet Row PULMONARY REHAB CHRONIC OBSTRUCTIVE PULMONARY DISEASE from 12/15/2020 in [place]  Date 10/20/20  Educator Hand Out  Instruction Review Code 1- Verbalizes Understanding      Bronchial Hygiene / Breathing Techniques: - Discuss breathing mechanics, pursed-lip breathing technique,  proper posture, effective ways to clear airways, and other functional breathing techniques Flowsheet  Row PULMONARY REHAB CHRONIC OBSTRUCTIVE PULMONARY DISEASE from 12/15/2020 in [place]  Date 10/27/20  Educator pb  Instruction Review Code 1- Verbalizes Understanding      Cleaning Equipment: - Provides group verbal and written instruction about the health risks of elevated stress, cause of high stress, and healthy ways to reduce stress. Flowsheet Row PULMONARY REHAB CHRONIC OBSTRUCTIVE PULMONARY DISEASE from 12/15/2020 in [place]  Date 11/03/20  Educator Handout  Instruction Review Code 1- Verbalizes Understanding      Nutrition I: Fats: - Discuss the types of cholesterol, what cholesterol does to the body, and how cholesterol levels can be controlled. Flowsheet Row PULMONARY REHAB CHRONIC OBSTRUCTIVE PULMONARY DISEASE from 12/15/2020 in [place]  Date 11/10/20  Educator handout  Instruction Review Code 1- Verbalizes Understanding      Nutrition II: Labels: -Discuss the different components of food labels and how to read food labels. Flowsheet Row PULMONARY REHAB CHRONIC OBSTRUCTIVE PULMONARY DISEASE from 12/15/2020 in [place]  Date 11/17/20  Educator DF  Instruction Review Code 2- Demonstrated Understanding      Respiratory Infections: - Discuss the signs and symptoms of respiratory infections, ways to prevent respiratory infections, and the importance of seeking medical treatment when having a respiratory infection. Flowsheet Row PULMONARY REHAB CHRONIC OBSTRUCTIVE PULMONARY DISEASE from 12/15/2020 in [place]  Date 11/24/20  Educator DF  Instruction Review Code 2- Demonstrated Understanding      Stress I: Signs and Symptoms: - Discuss the causes of stress, how stress may lead to anxiety and depression, and ways to limit stress. Flowsheet Row PULMONARY REHAB CHRONIC OBSTRUCTIVE PULMONARY DISEASE from 12/15/2020 in [place]   Date 12/01/20  Educator DF  Instruction Review Code 1- Verbalizes Understanding      Stress II: Relaxation: -Discuss relaxation techniques to limit stress.   Oxygen for Home/Travel: - Discuss how to prepare for travel when on oxygen and proper ways to transport and store oxygen to ensure safety. Flowsheet Row PULMONARY REHAB CHRONIC OBSTRUCTIVE PULMONARY DISEASE from 12/15/2020 in [place]  Date 12/15/20  Educator DJ  Instruction Review Code 1- Verbalizes Understanding      Knowledge Questionnaire Score:  Knowledge Questionnaire Score -  10/17/20 1326      Knowledge Questionnaire Score   Pre Score 14/18           Core Components/Risk Factors/Patient Goals at Admission:  Personal Goals and Risk Factors at Admission - 10/17/20 1321      Core Components/Risk Factors/Patient Goals on Admission    Weight Management Yes;Weight Loss    Intervention Weight Management: Develop a combined nutrition and exercise program designed to reach desired caloric intake, while maintaining appropriate intake of nutrient and fiber, sodium and fats, and appropriate energy expenditure required for the weight goal.;Weight Management: Provide education and appropriate resources to help participant work on and attain dietary goals.;Weight Management/Obesity: Establish reasonable short term and long term weight goals.;Obesity: Provide education and appropriate resources to help participant work on and attain dietary goals.    Expected Outcomes Short Term: Continue to assess and modify interventions until short term weight is achieved;Long Term: Adherence to nutrition and physical activity/exercise program aimed toward attainment of established weight goal;Weight Maintenance: Understanding of the daily nutrition guidelines, which includes 25-35% calories from fat, 7% or less cal from saturated fats, less than 200mg  cholesterol, less than 1.5gm of sodium, & 5 or more servings of fruits  and vegetables daily;Weight Loss: Understanding of general recommendations for a balanced deficit meal plan, which promotes 1-2 lb weight loss per week and includes a negative energy balance of [phone] kcal/d;Understanding of distribution of calorie intake throughout the day with the consumption of 4-5 meals/snacks;Understanding recommendations for meals to include 15-35% energy as protein, 25-35% energy from fat, 35-60% energy from carbohydrates, less than 200mg  of dietary cholesterol, 20-35 gm of total fiber daily;Weight Gain: Understanding of general recommendations for a high calorie, high protein meal plan that promotes weight gain by distributing calorie intake throughout the day with the consumption for 4-5 meals, snacks, and/or supplements    Tobacco Cessation Yes    Number of packs per day 1/2    Intervention Assist the participant in steps to quit. Provide individualized education and counseling about committing to Tobacco Cessation, relapse prevention, and pharmacological support that can be provided by physician.;Advice worker, assist with locating and accessing local/national Quit Smoking programs, and support quit date choice.    Expected Outcomes Short Term: Will quit all tobacco product use, adhering to prevention of relapse plan.;Long Term: Complete abstinence from all tobacco products for at least 12 months from quit date.;Short Term: Will demonstrate readiness to quit, by selecting a quit date.    Improve shortness of breath with ADL's Yes    Intervention Provide education, individualized exercise plan and daily activity instruction to help decrease symptoms of SOB with activities of daily living.    Expected Outcomes Short Term: Improve cardiorespiratory fitness to achieve a reduction of symptoms when performing ADLs;Long Term: Be able to perform more ADLs without symptoms or delay the onset of symptoms           Core Components/Risk Factors/Patient Goals Review:    Goals and Risk Factor Review    Row Name 10/28/20 0829 11/21/20 1456 12/21/20 0904         Core Components/Risk Factors/Patient Goals Review   Personal Goals Review Improve shortness of breath with ADL's;Tobacco Cessation;Weight Management/Obesity Improve shortness of breath with ADL's;Tobacco Cessation Tobacco Cessation;Improve shortness of breath with ADL's     Review Patient is new to the program completing 3 sessions. He was referred to pulmonary rehab with COPD. He continues to smoke 1/2 ppd cigerattes. His personal goals for the  program are to breathe better and get stronger. We will continue to monitor as he works toward meeting these goals. Patient has completed 8 sessions and maintianing his weight. He continues to smoke, will encourge to start a smoking cessation plan. His personal goals are to get stronger and breath better. His O2 sats average in the upper 90's Doing good with Purse lipped breathing techniques.  We will continue to monitor as patient works toward meeting these goals. Patient has completed 17 sessions and maintianing his weight. He continues to smoke, will encourge  smoking cessation . His personal goals are to get stronger and breath better. His O2 sats average in the upper 95-97%  Doing good with Purse lipped breathing techniques.  We will continue to monitor as patient works toward meeting these goals.     Expected Outcomes Patient will complete the program meeting both personal and program goal.s Patient will complete the program meeting both personal and program goal.s Patient will complete the program meeting both personal and program goals.            Core Components/Risk Factors/Patient Goals at Discharge (Final Review):   Goals and Risk Factor Review - 12/21/20 0904      Core Components/Risk Factors/Patient Goals Review   Personal Goals Review Tobacco Cessation;Improve shortness of breath with ADL's    Review Patient has completed 17 sessions and maintianing  his weight. He continues to smoke, will encourge  smoking cessation . His personal goals are to get stronger and breath better. His O2 sats average in the upper 95-97%  Doing good with Purse lipped breathing techniques.  We will continue to monitor as patient works toward meeting these goals.    Expected Outcomes Patient will complete the program meeting both personal and program goals.           ITP Comments:   Comments: ITP REVIEW Pt is making expected progress toward pulmonary rehab goals after completing 20 sessions. Recommend continued exercise, life style modification, education, and utilization of breathing techniques to increase stamina and strength and decrease shortness of breath with exertion.

## 2020-12-29 ENCOUNTER — Encounter (HOSPITAL_COMMUNITY)
Admission: RE | Admit: 2020-12-29 | Discharge: 2020-12-29 | Disposition: A | Discharge status: Home / Self Care | Payer: No Typology Code available for payment source | Source: Ambulatory Visit | Attending: Critical Care Medicine | Admitting: Critical Care Medicine

## 2020-12-29 ENCOUNTER — Other Ambulatory Visit: Payer: Self-pay

## 2020-12-29 DIAGNOSIS — J449 Chronic obstructive pulmonary disease, unspecified: Secondary | ICD-10-CM

## 2020-12-29 NOTE — Progress Notes (Signed)
Daily Session Note  Patient Details  Name: Glen Cobb MRN: [id number] Date of Birth: 10-Aug-1956 Referring Provider:   Flowsheet Row PULMONARY REHAB COPD ORIENTATION from 10/17/2020 in [place]  Referring Provider Dr. Rosaria Ferries      Encounter Date: 12/29/2020  Check In:  Session Check In - 12/29/20 1330      Check-In   Supervising physician immediately available to respond to emergencies [facility] MD immediately available    Physician(s) Dr. Harl Bowie    Location [facility]    Staff Present Aundra Dubin, RN, BSN;Carlette Wilber Oliphant, RN, Bjorn Loser, MS, ACSM-CEP, Exercise Physiologist    Virtual Visit No    Medication changes reported     No    Fall or balance concerns reported    No    Tobacco Cessation No Change    Warm-up and Cool-down Performed as group-led instruction    Resistance Training Performed Yes    VAD Patient? No    PAD/SET Patient? No      Pain Assessment   Currently in Pain? Yes    Pain Score 5     Pain Location Back    Pain Orientation Lower    Pain Descriptors / Indicators Constant    Pain Type Chronic pain    Pain Onset More than a month ago    Pain Frequency Constant    Aggravating Factors  Standing or sitting too long    Pain Relieving Factors Cortisone injections    Multiple Pain Sites Yes      2nd Pain Site   Pain Score 3    Pain Type Chronic pain    Pain Location Leg    Pain Orientation Left    Pain Descriptors / Indicators Constant    Pain Frequency Constant      3rd Pain Site   Pain Frequency Constant      Pain   Pain Onset 1 to 4 weeks ago    Pain Onset More than a month ago           Capillary Blood Glucose: No results found for this or any previous visit (from the past 24 hour(s)).    Social History   Tobacco Use  Smoking Status Current Every Day Smoker  . Packs/day: 0.25  . Years: 46.00  . Pack years: 11.50  . Types: Cigarettes  Smokeless Tobacco Former Systems developer  . Types: Snuff   Tobacco Comment   using a nicotine patch, trying to quit     Goals Met:  Proper associated with RPD/PD & O2 Sat Independence with exercise equipment Improved SOB with ADL's Using PLB without cueing & demonstrates good technique Exercise tolerated well No report of cardiac concerns or symptoms Strength training completed today  Goals Unmet:  Not Applicable  Comments: Check out 1430.   Dr. Kathie Dike is Medical Director for [facility] Pul[facility] Re[facility].

## 2021-01-03 ENCOUNTER — Encounter (HOSPITAL_COMMUNITY)
Admission: RE | Admit: 2021-01-03 | Discharge: 2021-01-03 | Disposition: A | Discharge status: Home / Self Care | Payer: No Typology Code available for payment source | Source: Ambulatory Visit | Attending: Critical Care Medicine | Admitting: Critical Care Medicine

## 2021-01-03 ENCOUNTER — Other Ambulatory Visit: Payer: Self-pay

## 2021-01-03 DIAGNOSIS — J449 Chronic obstructive pulmonary disease, unspecified: Secondary | ICD-10-CM

## 2021-01-03 NOTE — Progress Notes (Signed)
Daily Session Note  Patient Details  Name: Glen Cobb MRN: [id number] Date of Birth: [birth year] Referring Provider:   Flowsheet Row PULMONARY REHAB COPD ORIENTATION from 10/17/2020 in [place]  Referring Provider Dr. Rosaria Ferries      Encounter Date: 01/03/2021  Check In:  Session Check In - 01/03/21 1330      Check-In   Supervising physician immediately available to respond to emergencies [facility] immediately available    Physician(s) Dr. Harl Bowie    Location [facility]    Staff Present Geanie Cooley, RN;Madison Audria Nine, MS, Exercise Physiologist;Other;Khamora Karan Kris Mouton, MS, ACSM-CEP, Exercise Physiologist    Virtual Visit No    Medication changes reported     No    Fall or balance concerns reported    No    Tobacco Cessation No Change    Warm-up and Cool-down Performed as group-led instruction    Resistance Training Performed Yes    VAD Patient? No    PAD/SET Patient? No      Pain Assessment   Currently in Pain? Yes    Pain Score 4     Pain Location Back    Pain Orientation Lower    Pain Descriptors / Indicators Constant    Pain Type Chronic pain    Pain Onset More than a month ago    Pain Frequency Constant    Aggravating Factors  standing or sitting too long    Pain Relieving Factors cortisone injections    Multiple Pain Sites Yes      2nd Pain Site   Pain Score 3    Pain Type Chronic pain    Pain Location Knee    Pain Orientation Left    Pain Descriptors / Indicators Constant    Pain Frequency Constant           Capillary Blood Glucose: No results found for this or any previous visit (from the past 24 hour(s)).    Social History   Tobacco Use  Smoking Status Current Every Day Smoker  . Packs/day: 0.25  . Years: 46.00  . Pack years: 11.50  . Types: Cigarettes  Smokeless Tobacco Former Systems developer  . Types: Snuff  Tobacco Comment   using a nicotine patch, trying to quit     Goals Met:  Independence with  exercise equipment Exercise tolerated well No report of cardiac concerns or symptoms Strength training completed today  Goals Unmet:  Not Applicable  Comments: checkout time is 1430   Dr. Kathie Dike is Medical Director for [facility] Pulmonary Rehab.

## 2021-01-05 ENCOUNTER — Other Ambulatory Visit: Payer: Self-pay

## 2021-01-05 ENCOUNTER — Encounter (HOSPITAL_COMMUNITY)
Admission: RE | Admit: 2021-01-05 | Discharge: 2021-01-05 | Disposition: A | Discharge status: Home / Self Care | Payer: No Typology Code available for payment source | Source: Ambulatory Visit | Attending: Critical Care Medicine | Admitting: Critical Care Medicine

## 2021-01-05 DIAGNOSIS — J449 Chronic obstructive pulmonary disease, unspecified: Secondary | ICD-10-CM | POA: Diagnosis not present

## 2021-01-05 NOTE — Progress Notes (Signed)
Daily Session Note  Patient Details  Name: Glen Cobb MRN: [id number] Date of Birth: [birth year] Referring Provider:   Flowsheet Row PULMONARY REHAB COPD ORIENTATION from 10/17/2020 in [place]  Referring Provider Dr. Rosaria Ferries      Encounter Date: 01/05/2021  Check In:  Session Check In - 01/05/21 1330      Check-In   Supervising physician immediately available to respond to emergencies [facility] immediately available    Physician(s) Dr. Harrington Challenger    Location [facility]    Staff Present Aundra Dubin, RN, BSN;Madison Audria Nine, MS, Exercise Physiologist    Virtual Visit No    Medication changes reported     No    Fall or balance concerns reported    No    Tobacco Cessation No Change    Warm-up and Cool-down Performed as group-led instruction    Resistance Training Performed Yes    VAD Patient? No    PAD/SET Patient? No      Pain Assessment   Currently in Pain? Yes    Pain Score 4     Pain Location Back    Pain Orientation Lower    Pain Descriptors / Indicators Constant    Pain Type Chronic pain    Pain Onset More than a month ago    Pain Frequency Constant    Multiple Pain Sites Yes    Aggravating Factors  Weight bearing or exercise      Pain Screening   Pain Relieving Factors Cortisone injections    Effect of Pain on Daily Activities Limits activities at times.      2nd Pain Site   Pain Score 4    Pain Type Chronic pain    Pain Location Knee    Pain Orientation Left    Pain Descriptors / Indicators Constant    Pain Frequency Constant           Capillary Blood Glucose: No results found for this or any previous visit (from the past 24 hour(s)).    Social History   Tobacco Use  Smoking Status Current Every Day Smoker  . Packs/day: 0.25  . Years: 46.00  . Pack years: 11.50  . Types: Cigarettes  Smokeless Tobacco Former Systems developer  . Types: Snuff  Tobacco Comment   using a nicotine patch, trying to quit     Goals Met:   Proper associated with RPD/PD & O2 Sat Independence with exercise equipment Improved SOB with ADL's Using PLB without cueing & demonstrates good technique Exercise tolerated well No report of cardiac concerns or symptoms Strength training completed today  Goals Unmet:  Not Applicable  Comments: Check out 1430.   Dr. Kathie Dike is Medical Director for [facility] Pulmonary Rehab.

## 2021-01-10 ENCOUNTER — Other Ambulatory Visit: Payer: Self-pay

## 2021-01-10 ENCOUNTER — Encounter (HOSPITAL_COMMUNITY)
Admission: RE | Admit: 2021-01-10 | Discharge: 2021-01-10 | Disposition: A | Discharge status: Home / Self Care | Payer: No Typology Code available for payment source | Source: Ambulatory Visit | Attending: Critical Care Medicine | Admitting: Critical Care Medicine

## 2021-01-10 VITALS — Wt 166.4 lb

## 2021-01-10 DIAGNOSIS — J449 Chronic obstructive pulmonary disease, unspecified: Secondary | ICD-10-CM

## 2021-01-10 NOTE — Progress Notes (Signed)
Daily Session Note  Patient Details  Name: Glen Cobb MRN: [id number] Date of Birth: [birth year] Referring Provider:   Flowsheet Row PULMONARY REHAB COPD ORIENTATION from 10/17/2020 in [place]  Referring Provider Dr. Rosaria Ferries      Encounter Date: 01/10/2021  Check In:  Session Check In - 01/10/21 1330      Check-In   Supervising physician immediately available to respond to emergencies [facility] immediately available    Physician(s) Dr. Harl Bowie    Location [facility]    Staff Present Geanie Cooley, RN;Madison Audria Nine, MS, Exercise Physiologist;Maicy Filip Kris Mouton, MS, ACSM-CEP, Exercise Physiologist    Virtual Visit No    Medication changes reported     No    Fall or balance concerns reported    No    Tobacco Cessation No Change    Warm-up and Cool-down Performed as group-led instruction    Resistance Training Performed Yes    VAD Patient? No    PAD/SET Patient? No      Pain Assessment   Currently in Pain? Yes    Pain Score 5     Pain Location Neck    Pain Descriptors / Indicators Constant    Pain Type Chronic pain    Pain Onset More than a month ago    Pain Frequency Constant    Multiple Pain Sites Yes      2nd Pain Site   Pain Score 3    Pain Type Chronic pain    Pain Location Knee    Pain Orientation Left    Pain Descriptors / Indicators Constant    Pain Frequency Constant      Pain   Pain Onset More than a month ago    Pain Onset More than a month ago           Capillary Blood Glucose: No results found for this or any previous visit (from the past 24 hour(s)).    Social History   Tobacco Use  Smoking Status Current Every Day Smoker  . Packs/day: 0.25  . Years: 46.00  . Pack years: 11.50  . Types: Cigarettes  Smokeless Tobacco Former Systems developer  . Types: Snuff  Tobacco Comment   using a nicotine patch, trying to quit     Goals Met:  Independence with exercise equipment Exercise tolerated well No report  of cardiac concerns or symptoms Strength training completed today  Goals Unmet:  Not Applicable  Comments: checkout time is 1430   Dr. Kathie Dike is Medical Director for [facility] Pulmonary Rehab.

## 2021-01-12 ENCOUNTER — Other Ambulatory Visit: Payer: Self-pay

## 2021-01-12 ENCOUNTER — Encounter (HOSPITAL_COMMUNITY)
Admission: RE | Admit: 2021-01-12 | Discharge: 2021-01-12 | Disposition: A | Discharge status: Home / Self Care | Payer: No Typology Code available for payment source | Source: Ambulatory Visit | Attending: Critical Care Medicine | Admitting: Critical Care Medicine

## 2021-01-12 DIAGNOSIS — J449 Chronic obstructive pulmonary disease, unspecified: Secondary | ICD-10-CM | POA: Diagnosis not present

## 2021-01-12 NOTE — Progress Notes (Signed)
Daily Session Note  Patient Details  Name: Glen Cobb MRN: [id number] Date of Birth: [birth year] Referring Provider:   Flowsheet Row PULMONARY REHAB COPD ORIENTATION from 10/17/2020 in [place]  Referring Provider Dr. Rosaria Ferries      Encounter Date: 01/12/2021  Check In:  Session Check In - 01/12/21 1330      Check-In   Supervising physician immediately available to respond to emergencies [facility] immediately available    Physician(s) Dr. Harl Bowie    Location [facility]    Staff Present Cathren Harsh, MS, Exercise Physiologist;Durga Saldarriaga Wynetta Emery, RN, BSN;Phyllis Billingsley, RN    Virtual Visit No    Medication changes reported     No    Fall or balance concerns reported    No    Tobacco Cessation No Change    Warm-up and Cool-down Performed as group-led instruction    Resistance Training Performed Yes    VAD Patient? No    PAD/SET Patient? No      Pain Assessment   Currently in Pain? Yes    Pain Score 4     Pain Location Back    Pain Orientation Lower    Pain Descriptors / Indicators Constant    Pain Type Chronic pain    Pain Onset More than a month ago    Aggravating Factors  Standing or sitting too long.    Pain Relieving Factors Cortisone injections    Multiple Pain Sites Yes      2nd Pain Site   Pain Score 3    Pain Type Chronic pain    Pain Location Knee    Pain Orientation Left    Pain Descriptors / Indicators Constant    Pain Frequency Constant           Capillary Blood Glucose: No results found for this or any previous visit (from the past 24 hour(s)).    Social History   Tobacco Use  Smoking Status Current Every Day Smoker  . Packs/day: 0.25  . Years: 46.00  . Pack years: 11.50  . Types: Cigarettes  Smokeless Tobacco Former Systems developer  . Types: Snuff  Tobacco Comment   using a nicotine patch, trying to quit     Goals Met:  Proper associated with RPD/PD & O2 Sat Independence with exercise equipment Improved  SOB with ADL's Exercise tolerated well No report of cardiac concerns or symptoms Strength training completed today  Goals Unmet:  Not Applicable  Comments: Check out 1430.   Dr. Kathie Dike is Medical Director for [facility] Pulmonary Rehab.

## 2021-01-17 ENCOUNTER — Other Ambulatory Visit: Payer: Self-pay

## 2021-01-17 ENCOUNTER — Encounter (HOSPITAL_COMMUNITY)
Admission: RE | Admit: 2021-01-17 | Discharge: 2021-01-17 | Disposition: A | Discharge status: Home / Self Care | Payer: No Typology Code available for payment source | Source: Ambulatory Visit | Attending: Critical Care Medicine | Admitting: Critical Care Medicine

## 2021-01-17 DIAGNOSIS — J449 Chronic obstructive pulmonary disease, unspecified: Secondary | ICD-10-CM

## 2021-01-17 NOTE — Progress Notes (Signed)
Daily Session Note  Patient Details  Name: Glen Cobb MRN: [id number] Date of Birth: March 19, 1956 Referring Provider:   Flowsheet Row PULMONARY REHAB COPD ORIENTATION from 10/17/2020 in [place]  Referring Provider Dr. Rosaria Ferries      Encounter Date: 01/17/2021  Check In:  Session Check In - 01/17/21 1330      Check-In   Supervising physician immediately available to respond to emergencies [facility] MD immediately available    Physician(s) Dr. Harl Bowie    Location [facility]    Staff Present Geanie Cooley, RN;Madison Audria Nine, MS, Exercise Physiologist;Kelvin Burpee Kris Mouton, MS, ACSM-CEP, Exercise Physiologist    Virtual Visit No    Medication changes reported     No    Fall or balance concerns reported    No    Tobacco Cessation No Change    Warm-up and Cool-down Performed as group-led instruction    Resistance Training Performed Yes    VAD Patient? No    PAD/SET Patient? No      Pain Assessment   Currently in Pain? Yes    Pain Score 4     Pain Location Neck    Pain Orientation Upper    Pain Descriptors / Indicators Constant    Pain Type Chronic pain    Pain Onset More than a month ago    Pain Frequency Constant    Aggravating Factors  standing or sitting too long    Pain Relieving Factors Cortisone injections    Multiple Pain Sites Yes      2nd Pain Site   Pain Score 4    Pain Type Chronic pain    Pain Location Knee    Pain Orientation Left    Pain Descriptors / Indicators Constant    Pain Frequency Constant           Capillary Blood Glucose: No results found for this or any previous visit (from the past 24 hour(s)).    Social History   Tobacco Use  Smoking Status Current Every Day Smoker  . Packs/day: 0.25  . Years: 46.00  . Pack years: 11.50  . Types: Cigarettes  Smokeless Tobacco Former Systems developer  . Types: Snuff  Tobacco Comment   using a nicotine patch, trying to quit     Goals Met:  Independence with exercise  equipment Exercise tolerated well No report of cardiac concerns or symptoms Strength training completed today  Goals Unmet:  Not Applicable  Comments: checkout time is 1430   Dr. Kathie Dike is Medical Director for [facility] Pulmonary Rehab.

## 2021-01-19 ENCOUNTER — Other Ambulatory Visit: Payer: Self-pay

## 2021-01-19 ENCOUNTER — Encounter (HOSPITAL_COMMUNITY)
Admission: RE | Admit: 2021-01-19 | Discharge: 2021-01-19 | Disposition: A | Discharge status: Home / Self Care | Payer: No Typology Code available for payment source | Source: Ambulatory Visit | Attending: Critical Care Medicine | Admitting: Critical Care Medicine

## 2021-01-19 DIAGNOSIS — J449 Chronic obstructive pulmonary disease, unspecified: Secondary | ICD-10-CM

## 2021-01-19 NOTE — Progress Notes (Signed)
Daily Session Note  Patient Details  Name: Glen Cobb MRN: [id number] Date of Birth: August 07, 1956 Referring Provider:   Flowsheet Row PULMONARY REHAB COPD ORIENTATION from 10/17/2020 in [place]  Referring Provider Dr. Rosaria Ferries      Encounter Date: 01/19/2021  Check In:  Session Check In - 01/19/21 1330      Check-In   Supervising physician immediately available to respond to emergencies [facility] MD immediately available    Physician(s) Dr. Domenic Polite    Location [facility]    Staff Present Cathren Harsh, MS, Exercise Physiologist;Phyllis Billingsley, RN    Virtual Visit No    Medication changes reported     No    Fall or balance concerns reported    No    Tobacco Cessation No Change    Warm-up and Cool-down Performed as group-led instruction    Resistance Training Performed Yes    VAD Patient? No    PAD/SET Patient? No      Pain Assessment   Currently in Pain? Yes    Pain Score 5     Pain Location Back    Pain Orientation Lower    Pain Descriptors / Indicators Constant    Pain Type Chronic pain    Multiple Pain Sites Yes      2nd Pain Site   Pain Score 4    Pain Type Chronic pain    Pain Location Knee    Pain Orientation Left    Pain Descriptors / Indicators Constant           Capillary Blood Glucose: No results found for this or any previous visit (from the past 24 hour(s)).    Social History   Tobacco Use  Smoking Status Current Every Day Smoker  . Packs/day: 0.25  . Years: 46.00  . Pack years: 11.50  . Types: Cigarettes  Smokeless Tobacco Former Systems developer  . Types: Snuff  Tobacco Comment   using a nicotine patch, trying to quit     Goals Met:  Independence with exercise equipment Exercise tolerated well No report of cardiac concerns or symptoms Strength training completed today  Goals Unmet:  Not Applicable  Comments: check out 1430   Dr. Kathie Dike is Medical Director for [facility] Pulmonary  Rehab.

## 2021-01-24 ENCOUNTER — Encounter (HOSPITAL_COMMUNITY)
Admission: RE | Admit: 2021-01-24 | Discharge: 2021-01-24 | Disposition: A | Discharge status: Home / Self Care | Payer: No Typology Code available for payment source | Source: Ambulatory Visit | Attending: Critical Care Medicine | Admitting: Critical Care Medicine

## 2021-01-24 ENCOUNTER — Other Ambulatory Visit: Payer: Self-pay

## 2021-01-24 VITALS — Wt 167.5 lb

## 2021-01-24 DIAGNOSIS — J449 Chronic obstructive pulmonary disease, unspecified: Secondary | ICD-10-CM | POA: Insufficient documentation

## 2021-01-24 NOTE — Progress Notes (Signed)
Daily Session Note  Patient Details  Name: Glen Cobb MRN: [id number] Date of Birth: Dec 13, 1955 Referring Provider:   Flowsheet Row PULMONARY REHAB COPD ORIENTATION from 10/17/2020 in [place]  Referring Provider Dr. Rosaria Ferries      Encounter Date: 01/24/2021  Check In:  Session Check In - 01/24/21 1330      Check-In   Supervising physician immediately available to respond to emergencies [facility] MD immediately available    Physician(s) Dr. Harl Bowie    Location [facility]    Staff Present Cathren Harsh, MS, Exercise Physiologist;Davari Lopes Kris Mouton, MS, ACSM-CEP, Exercise Physiologist;Phyllis Billingsley, RN    Virtual Visit No    Medication changes reported     No    Fall or balance concerns reported    No    Tobacco Cessation No Change    Warm-up and Cool-down Performed as group-led instruction    Resistance Training Performed Yes    VAD Patient? No    PAD/SET Patient? No      Pain Assessment   Currently in Pain? Yes    Pain Score 5     Pain Location Back    Pain Orientation Lower    Pain Descriptors / Indicators Constant    Pain Type Chronic pain    Pain Onset More than a month ago    Pain Frequency Constant    Aggravating Factors  standing or sitting too long    Pain Relieving Factors cortisone injections    Multiple Pain Sites Yes    Aggravating Factors  weight bearing or exercise    Aggravating Factors  certain movements      2nd Pain Site   Pain Score 4    Pain Type Chronic pain    Pain Location Knee    Pain Orientation Left    Pain Descriptors / Indicators Constant    Pain Frequency Constant           Capillary Blood Glucose: No results found for this or any previous visit (from the past 24 hour(s)).    Social History   Tobacco Use  Smoking Status Current Every Day Smoker  . Packs/day: 0.25  . Years: 46.00  . Pack years: 11.50  . Types: Cigarettes  Smokeless Tobacco Former Systems developer  . Types: Snuff  Tobacco  Comment   using a nicotine patch, trying to quit     Goals Met:  Independence with exercise equipment Exercise tolerated well No report of cardiac concerns or symptoms Strength training completed today  Goals Unmet:  Not Applicable  Comments: checkout time is 1430   Dr. Kathie Dike is Medical Director for [facility] Pulmonary Rehab.

## 2021-01-26 ENCOUNTER — Encounter (HOSPITAL_COMMUNITY)
Admission: RE | Admit: 2021-01-26 | Discharge: 2021-01-26 | Disposition: A | Discharge status: Home / Self Care | Payer: No Typology Code available for payment source | Source: Ambulatory Visit | Attending: Critical Care Medicine | Admitting: Critical Care Medicine

## 2021-01-26 ENCOUNTER — Other Ambulatory Visit: Payer: Self-pay

## 2021-01-26 DIAGNOSIS — J449 Chronic obstructive pulmonary disease, unspecified: Secondary | ICD-10-CM | POA: Diagnosis not present

## 2021-01-26 NOTE — Progress Notes (Signed)
Pulmonary Individual Treatment Plan  Patient Details  Name: Glen Cobb MRN: [id number] Date of Birth: 1955-10-17 Referring Provider:   Flowsheet Row PULMONARY REHAB COPD ORIENTATION from 10/17/2020 in [place]  Referring Provider Dr. Rosaria Ferries      Initial Encounter Date:  Flowsheet Row PULMONARY REHAB COPD ORIENTATION from 10/17/2020 in [place]  Date 10/17/20      Visit Diagnosis: Chronic obstructive pulmonary disease, unspecified COPD type ([place])  Patient's Home Medications on Admission:   Current Outpatient Medications:  .  Acidophilus Lactobacillus CAPS, Take 2 tablets by mouth daily at 12 noon., Disp: , Rfl:  .  acyclovir (ZOVIRAX) 400 MG tablet, Take 400 mg by mouth 2 (two) times daily as needed (for flareup)., Disp: , Rfl:  .  albuterol-ipratropium (COMBIVENT) 18-103 MCG/ACT inhaler, Inhale 1 puff into the lungs every 6 (six) hours as needed., Disp: , Rfl:  .  allopurinol (ZYLOPRIM) 100 MG tablet, Take 100 mg by mouth daily., Disp: , Rfl:  .  amLODipine (NORVASC) 5 MG tablet, Take 10 mg by mouth daily., Disp: , Rfl:  .  budesonide-formoterol (SYMBICORT) 160-4.5 MCG/ACT inhaler, Inhale 2 puffs into the lungs 2 (two) times daily., Disp: , Rfl:  .  cyclobenzaprine (FLEXERIL) 10 MG tablet, Take 15 mg by mouth at bedtime., Disp: , Rfl:  .  gabapentin (NEURONTIN) 600 MG tablet, Take 1,200 mg by mouth 3 (three) times daily., Disp: , Rfl:  .  hydrOXYzine (VISTARIL) 25 MG capsule, Take 25 mg by mouth every 6 (six) hours as needed for itching (sleep)., Disp: , Rfl:  .  lidocaine (LIDODERM) 5 %, Place 1 patch onto the skin daily. Remove & Discard patch within 12 hours or as directed by MD, Disp: , Rfl:  .  omeprazole (PRILOSEC) 40 MG capsule, Take 40 mg by mouth daily., Disp: , Rfl:  .  potassium chloride (KLOR-CON) 10 MEQ tablet, Take 10 mEq by mouth daily., Disp: , Rfl:  .  predniSONE (DELTASONE) 20 MG tablet, Take 3 tablets by mouth  daily x1 day; then 2 tablets by mouth daily x2 days; then 1 tablet by mouth daily x3 days; then half tablet by mouth daily x3 days and stop prednisone. (Patient taking differently: Take 3 tablets by mouth daily x1 day; then 2 tablets by mouth daily x2 days; then 1 tablet by mouth daily x3 days; then half tablet by mouth daily x3 days and stop prednisone. As needed for gout flareup), Disp: 30 tablet, Rfl: 0 .  Propylene Glycol (SYSTANE BALANCE OP), Apply 1 drop to eye 4 (four) times daily. Both eyes, Disp: , Rfl:  .  Riboflavin 100 MG TABS, Take 400 mg by mouth daily., Disp: , Rfl:  .  sildenafil (VIAGRA) 100 MG tablet, Take 50 mg by mouth daily as needed for erectile dysfunction., Disp: , Rfl:  .  Tiotropium Bromide Monohydrate (SPIRIVA RESPIMAT) 2.5 MCG/ACT AERS, Inhale 2 puffs into the lungs daily., Disp: , Rfl:  .  traMADol (ULTRAM) 50 MG tablet, Take 100 mg by mouth 3 (three) times daily as needed for moderate pain., Disp: , Rfl:   Past Medical History: Past Medical History:  Diagnosis Date  . Arthritis   . Cancer ([place])    bladder cancer x 2  . Cataract    MD just watching  . COPD (chronic obstructive pulmonary disease) ([place])   . Emphysema of lung ([place])   . FH: migraines    last one 1 yr ago - OTC  med prn  . Gout   . HSV infection   . Hyperlipidemia    no med. diet  . Hypertension   . Kidney stone    passed stone - no surgery  . Osteoporosis   . Sleep apnea    Does not use CPAP nightly.    Tobacco Use: Social History   Tobacco Use  Smoking Status Current Every Day Smoker  . Packs/day: 0.25  . Years: 46.00  . Pack years: 11.50  . Types: Cigarettes  Smokeless Tobacco Former Systems developer  . Types: Snuff  Tobacco Comment   using a nicotine patch, trying to quit     Labs: Recent Review Flowsheet Data   There is no flowsheet data to display.     Capillary Blood Glucose: Lab Results  Component Value Date   GLUCAP 191 (H) 08/22/2019   GLUCAP 145 (H) 08/20/2019   GLUCAP  182 (H) 08/19/2019     Pulmonary Assessment Scores:  Pulmonary Assessment Scores    Row Name 10/17/20 1329         ADL UCSD   ADL Phase Entry     SOB Score total 24           CAT Score   CAT Score 22           mMRC Score   mMRC Score 2           UCSD: Self-administered rating of dyspnea associated with activities of daily living (ADLs) 6-point scale (0 = "not at all" to 5 = "maximal or unable to do because of breathlessness")  Scoring Scores range from 0 to 120.  Minimally important difference is 5 units  CAT: CAT can identify the health impairment of COPD patients and is better correlated with disease progression.  CAT has a scoring range of zero to 40. The CAT score is classified into four groups of low (less than 10), medium (10 - 20), high (21-30) and very high (31-40) based on the impact level of disease on health status. A CAT score over 10 suggests significant symptoms.  A worsening CAT score could be explained by an exacerbation, poor medication adherence, poor inhaler technique, or progression of COPD or comorbid conditions.  CAT MCID is 2 points  mMRC: mMRC (Modified Medical Research Council) Dyspnea Scale is used to assess the degree of baseline functional disability in patients of respiratory disease due to dyspnea. No minimal important difference is established. A decrease in score of 1 point or greater is considered a positive change.   Pulmonary Function Assessment:  Pulmonary Function Assessment - 10/17/20 1325      Breath   Shortness of Breath Yes;Limiting activity;Fear of Shortness of Breath           Exercise Target Goals: Exercise Program Goal: Individual exercise prescription set using results from initial 6 min walk test and THRR while considering  patient's activity barriers and safety.   Exercise Prescription Goal: Initial exercise prescription builds to 30-45 minutes a day of aerobic activity, 2-3 days per week.  Home exercise guidelines  will be given to patient during program as part of exercise prescription that the participant will acknowledge.  Activity Barriers & Risk Stratification:  Activity Barriers & Cardiac Risk Stratification - 10/17/20 1322      Activity Barriers & Cardiac Risk Stratification   Activity Barriers Arthritis;Back Problems;Neck/Spine Problems;Joint Problems;Deconditioning;Muscular Weakness;Shortness of Breath;Balance Concerns;History of Falls    Cardiac Risk Stratification Moderate  6 Minute Walk:  6 Minute Walk    Row Name 10/17/20 1459         6 Minute Walk   Phase Initial     Distance 1100 feet     Walk Time 6 minutes     # of Rest Breaks 0     MPH 2.08     METS 3.14     RPE 9     Perceived Dyspnea  11     VO2 Peak 10.99     Symptoms Yes (comment)     Comments lower back pain 5/10, left foot pain 3/10, right hip pain 3/10     Resting HR 77 bpm     Resting BP 132/78     Resting Oxygen Saturation  94 %     Exercise Oxygen Saturation  during 6 min walk 88 %     Max Ex. HR 94 bpm     Max Ex. BP 142/78     2 Minute Post BP 128/76           Interval HR   1 Minute HR 92     2 Minute HR 91     3 Minute HR 92     4 Minute HR 92     5 Minute HR 94     6 Minute HR 90     2 Minute Post HR 87     Interval Heart Rate? Yes           Interval Oxygen   Interval Oxygen? Yes     Baseline Oxygen Saturation % 94 %     1 Minute Oxygen Saturation % 92 %     1 Minute Liters of Oxygen 0 L     2 Minute Oxygen Saturation % 92 %     2 Minute Liters of Oxygen 0 L     3 Minute Oxygen Saturation % 91 %     3 Minute Liters of Oxygen 0 L     4 Minute Oxygen Saturation % 91 %     4 Minute Liters of Oxygen 0 L     5 Minute Oxygen Saturation % 89 %     5 Minute Liters of Oxygen 0 L     6 Minute Oxygen Saturation % 88 %     6 Minute Liters of Oxygen 0 L     2 Minute Post Oxygen Saturation % 97 %     2 Minute Post Liters of Oxygen 0 L            Oxygen Initial Assessment:   Oxygen Initial Assessment - 10/17/20 1458      Initial 6 min Walk   Oxygen Used None      Program Oxygen Prescription   Program Oxygen Prescription None      Intervention   Short Term Goals To learn and exhibit compliance with exercise, home and travel O2 prescription;To learn and understand importance of monitoring SPO2 with pulse oximeter and demonstrate accurate use of the pulse oximeter.;To learn and understand importance of maintaining oxygen saturations>88%;To learn and demonstrate proper pursed lip breathing techniques or other breathing techniques.;To learn and demonstrate proper use of respiratory medications    Long  Term Goals Exhibits compliance with exercise, home and travel O2 prescription;Verbalizes importance of monitoring SPO2 with pulse oximeter and return demonstration;Maintenance of O2 saturations>88%;Exhibits proper breathing techniques, such as pursed lip breathing or other method taught during program session;Compliance with respiratory medication;Demonstrates proper  use of MDI's           Oxygen Re-Evaluation:  Oxygen Re-Evaluation    Row Name 11/01/20 0747 11/29/20 1455 12/27/20 1541 01/24/21 1415       Program Oxygen Prescription   Program Oxygen Prescription None None None None         Home Oxygen   Home Oxygen Device None None None None    Sleep Oxygen Prescription None None None None    Home Exercise Oxygen Prescription None None None None    Home Resting Oxygen Prescription None None None None    Compliance with Home Oxygen Use Yes Yes Yes Yes         Goals/Expected Outcomes   Short Term Goals To learn and exhibit compliance with exercise, home and travel O2 prescription;To learn and understand importance of monitoring SPO2 with pulse oximeter and demonstrate accurate use of the pulse oximeter.;To learn and understand importance of maintaining oxygen saturations>88%;To learn and demonstrate proper pursed lip breathing techniques or other breathing  techniques.;To learn and demonstrate proper use of respiratory medications To learn and exhibit compliance with exercise, home and travel O2 prescription;To learn and understand importance of monitoring SPO2 with pulse oximeter and demonstrate accurate use of the pulse oximeter.;To learn and understand importance of maintaining oxygen saturations>88%;To learn and demonstrate proper pursed lip breathing techniques or other breathing techniques.;To learn and demonstrate proper use of respiratory medications To learn and exhibit compliance with exercise, home and travel O2 prescription;To learn and understand importance of monitoring SPO2 with pulse oximeter and demonstrate accurate use of the pulse oximeter.;To learn and understand importance of maintaining oxygen saturations>88%;To learn and demonstrate proper pursed lip breathing techniques or other breathing techniques.;To learn and demonstrate proper use of respiratory medications To learn and exhibit compliance with exercise, home and travel O2 prescription;To learn and understand importance of monitoring SPO2 with pulse oximeter and demonstrate accurate use of the pulse oximeter.;To learn and understand importance of maintaining oxygen saturations>88%;To learn and demonstrate proper pursed lip breathing techniques or other breathing techniques.;To learn and demonstrate proper use of respiratory medications    Long  Term Goals Exhibits compliance with exercise, home and travel O2 prescription;Verbalizes importance of monitoring SPO2 with pulse oximeter and return demonstration;Maintenance of O2 saturations>88%;Exhibits proper breathing techniques, such as pursed lip breathing or other method taught during program session;Compliance with respiratory medication;Demonstrates proper use of MDI's Exhibits compliance with exercise, home and travel O2 prescription;Verbalizes importance of monitoring SPO2 with pulse oximeter and return demonstration;Maintenance of  O2 saturations>88%;Exhibits proper breathing techniques, such as pursed lip breathing or other method taught during program session;Compliance with respiratory medication;Demonstrates proper use of MDI's Exhibits compliance with exercise, home and travel O2 prescription;Verbalizes importance of monitoring SPO2 with pulse oximeter and return demonstration;Maintenance of O2 saturations>88%;Exhibits proper breathing techniques, such as pursed lip breathing or other method taught during program session;Compliance with respiratory medication;Demonstrates proper use of MDI's Exhibits compliance with exercise, home and travel O2 prescription;Verbalizes importance of monitoring SPO2 with pulse oximeter and return demonstration;Maintenance of O2 saturations>88%;Exhibits proper breathing techniques, such as pursed lip breathing or other method taught during program session;Compliance with respiratory medication;Demonstrates proper use of MDI's    Goals/Expected Outcomes Compliance Compliance Compliance Compliance           Oxygen Discharge (Final Oxygen Re-Evaluation):  Oxygen Re-Evaluation - 01/24/21 1415      Program Oxygen Prescription   Program Oxygen Prescription None      Home Oxygen   Home Oxygen Device  None    Sleep Oxygen Prescription None    Home Exercise Oxygen Prescription None    Home Resting Oxygen Prescription None    Compliance with Home Oxygen Use Yes      Goals/Expected Outcomes   Short Term Goals To learn and exhibit compliance with exercise, home and travel O2 prescription;To learn and understand importance of monitoring SPO2 with pulse oximeter and demonstrate accurate use of the pulse oximeter.;To learn and understand importance of maintaining oxygen saturations>88%;To learn and demonstrate proper pursed lip breathing techniques or other breathing techniques.;To learn and demonstrate proper use of respiratory medications    Long  Term Goals Exhibits compliance with exercise,  home and travel O2 prescription;Verbalizes importance of monitoring SPO2 with pulse oximeter and return demonstration;Maintenance of O2 saturations>88%;Exhibits proper breathing techniques, such as pursed lip breathing or other method taught during program session;Compliance with respiratory medication;Demonstrates proper use of MDI's    Goals/Expected Outcomes Compliance           Initial Exercise Prescription:  Initial Exercise Prescription - 10/17/20 1500      Date of Initial Exercise RX and Referring Provider   Date 10/17/20    Referring Provider Dr. Rosaria Ferries    Expected Discharge Date 01/02/21   This is his current [place]. date. We have requested it be extended.     NuStep   Level 1    SPM 60    Minutes 39      Prescription Details   Frequency (times per week) 2    Duration Progress to 30 minutes of continuous aerobic without signs/symptoms of physical distress      Intensity   THRR 40-80% of Max Heartrate 63-126    Ratings of Perceived Exertion 11-13    Perceived Dyspnea 0-4      Progression   Progression Continue progressive overload as per policy without signs/symptoms or physical distress.      Resistance Training   Training Prescription Yes    Weight 3 lbs    Reps 10-15           Perform Capillary Blood Glucose checks as needed.  Exercise Prescription Changes:   Exercise Prescription Changes    Row Name 11/02/20 0700 11/10/20 1400 11/29/20 1400 12/13/20 1500 12/27/20 1500     Response to Exercise   Blood Pressure (Admit) 142/80 142/72 122/74 124/70 128/62   Blood Pressure (Exercise) 160/78 170/76 150/76 162/82 164/62   Blood Pressure (Exit) 144/82 120/60 124/76 110/62 118/58   Heart Rate (Admit) 83 bpm 84 bpm 85 bpm 94 bpm 70 bpm   Heart Rate (Exercise) 93 bpm 104 bpm 107 bpm 108 bpm 110 bpm   Heart Rate (Exit) 88 bpm 89 bpm 93 bpm 93 bpm 90 bpm   Oxygen Saturation (Admit) 97 % 95 % 96 % 96 % 97 %   Oxygen Saturation (Exercise) 95 % 96 % 93 % 95 % 95  %   Oxygen Saturation (Exit) 95 % 94 % 94 % 94 % 96 %   Rating of Perceived Exertion (Exercise) 11 10 10 9 9    Perceived Dyspnea (Exercise) 12 10 10 9 9    Duration Continue with 30 min of aerobic exercise without signs/symptoms of physical distress. Continue with 30 min of aerobic exercise without signs/symptoms of physical distress. Continue with 30 min of aerobic exercise without signs/symptoms of physical distress. Continue with 30 min of aerobic exercise without signs/symptoms of physical distress. Continue with 30 min of aerobic exercise without signs/symptoms of physical  distress.   Intensity THRR unchanged THRR unchanged THRR unchanged THRR unchanged THRR unchanged     Progression   Progression Continue to progress workloads to maintain intensity without signs/symptoms of physical distress. Continue to progress workloads to maintain intensity without signs/symptoms of physical distress. Continue to progress workloads to maintain intensity without signs/symptoms of physical distress. Continue to progress workloads to maintain intensity without signs/symptoms of physical distress. Continue to progress workloads to maintain intensity without signs/symptoms of physical distress.     Resistance Training   Training Prescription Yes Yes Yes Yes Yes   Weight 3 lbs 5 5 lbs 5 lbs 5 lbs   Reps 10-15 10-15 10-15 10-15 10-15   Time 10 Minutes 10 Minutes 10 Minutes 10 Minutes 10 Minutes     NuStep   Level 2 3 3 3 4    SPM 109 112 116 114 109   Minutes 39 39 22 22 22    METs 2.2 2.4 2.5 2.4 2.5     Recumbant Elliptical   Level -- -- 1 3 4    RPM -- -- 66 75 65   Minutes -- -- 17 17 17    METs -- -- 4.9 5.7 4.8     Home Exercise Plan   Plans to continue exercise at -- Home (comment) -- -- --   Frequency -- Add 2 additional days to program exercise sessions. -- -- --   Initial Home Exercises Provided -- 11/10/20 -- -- --   [place] Name 01/10/21 1500 01/24/21 1500           Response to Exercise    Blood Pressure (Admit) 128/62 124/70      Blood Pressure (Exercise) 156/70 170/78      Blood Pressure (Exit) 102/62 122/70      Heart Rate (Admit) 64 bpm 68 bpm      Heart Rate (Exercise) 103 bpm 124 bpm      Heart Rate (Exit) 77 bpm 78 bpm      Oxygen Saturation (Admit) 97 % 96 %      Oxygen Saturation (Exercise) 96 % 95 %      Oxygen Saturation (Exit) 95 % 93 %      Rating of Perceived Exertion (Exercise) 9 10      Perceived Dyspnea (Exercise) 9 10      Duration Continue with 30 min of aerobic exercise without signs/symptoms of physical distress. Continue with 30 min of aerobic exercise without signs/symptoms of physical distress.      Intensity THRR unchanged THRR unchanged             Progression   Progression Continue to progress workloads to maintain intensity without signs/symptoms of physical distress. Continue to progress workloads to maintain intensity without signs/symptoms of physical distress.             Resistance Training   Training Prescription Yes Yes      Weight 5 lbs 5 lbs      Reps 10-15 10-15      Time 10 Minutes 10 Minutes             NuStep   Level 5 5      SPM 87 110      Minutes 22 22      METs 2.3 2.7             Recumbant Elliptical   Level 5 6      RPM 64 59      Minutes 17 17  METs 5 5.1             Exercise Comments:   Exercise Comments    Row Name 10/20/20 1441 11/10/20 1438         Exercise Comments Patient completed first exercise session today. He tolerated exercise well and was setting goals for himself to complete 1 mile on each station. He is excited to come back to rehab and continue the program. Reviewed home exercise with patient today. He is currently exercising at home by walking a little bit and doing some exercises he learned from physical therapy. He is going to start walking more and is going to ask a family member to walk with him.             Exercise Goals and Review:   Exercise Goals    Row Name 10/17/20  1506 11/02/20 0744 11/29/20 1457 12/27/20 1543 01/24/21 1415     Exercise Goals   Increase Physical Activity Yes Yes Yes Yes Yes   Intervention Provide advice, education, support and counseling about physical activity/exercise needs.;Develop an individualized exercise prescription for aerobic and resistive training based on initial evaluation findings, risk stratification, comorbidities and participant's personal goals. Provide advice, education, support and counseling about physical activity/exercise needs.;Develop an individualized exercise prescription for aerobic and resistive training based on initial evaluation findings, risk stratification, comorbidities and participant's personal goals. Provide advice, education, support and counseling about physical activity/exercise needs.;Develop an individualized exercise prescription for aerobic and resistive training based on initial evaluation findings, risk stratification, comorbidities and participant's personal goals. Provide advice, education, support and counseling about physical activity/exercise needs.;Develop an individualized exercise prescription for aerobic and resistive training based on initial evaluation findings, risk stratification, comorbidities and participant's personal goals. Provide advice, education, support and counseling about physical activity/exercise needs.;Develop an individualized exercise prescription for aerobic and resistive training based on initial evaluation findings, risk stratification, comorbidities and participant's personal goals.   Expected Outcomes Short Term: Attend rehab on a regular basis to increase amount of physical activity.;Long Term: Add in home exercise to make exercise part of routine and to increase amount of physical activity.;Long Term: Exercising regularly at least 3-5 days a week. Short Term: Attend rehab on a regular basis to increase amount of physical activity.;Long Term: Add in home exercise to make  exercise part of routine and to increase amount of physical activity.;Long Term: Exercising regularly at least 3-5 days a week. Short Term: Attend rehab on a regular basis to increase amount of physical activity.;Long Term: Add in home exercise to make exercise part of routine and to increase amount of physical activity.;Long Term: Exercising regularly at least 3-5 days a week. Short Term: Attend rehab on a regular basis to increase amount of physical activity.;Long Term: Add in home exercise to make exercise part of routine and to increase amount of physical activity.;Long Term: Exercising regularly at least 3-5 days a week. Short Term: Attend rehab on a regular basis to increase amount of physical activity.;Long Term: Add in home exercise to make exercise part of routine and to increase amount of physical activity.;Long Term: Exercising regularly at least 3-5 days a week.   Increase Strength and Stamina Yes Yes Yes Yes Yes   Intervention Provide advice, education, support and counseling about physical activity/exercise needs.;Develop an individualized exercise prescription for aerobic and resistive training based on initial evaluation findings, risk stratification, comorbidities and participant's personal goals. Provide advice, education, support and counseling about physical activity/exercise needs.;Develop an individualized exercise prescription for  aerobic and resistive training based on initial evaluation findings, risk stratification, comorbidities and participant's personal goals. Provide advice, education, support and counseling about physical activity/exercise needs.;Develop an individualized exercise prescription for aerobic and resistive training based on initial evaluation findings, risk stratification, comorbidities and participant's personal goals. Provide advice, education, support and counseling about physical activity/exercise needs.;Develop an individualized exercise prescription for aerobic and  resistive training based on initial evaluation findings, risk stratification, comorbidities and participant's personal goals. Provide advice, education, support and counseling about physical activity/exercise needs.;Develop an individualized exercise prescription for aerobic and resistive training based on initial evaluation findings, risk stratification, comorbidities and participant's personal goals.   Expected Outcomes Short Term: Increase workloads from initial exercise prescription for resistance, speed, and METs.;Short Term: Perform resistance training exercises routinely during rehab and add in resistance training at home;Long Term: Improve cardiorespiratory fitness, muscular endurance and strength as measured by increased METs and functional capacity (6MWT) Short Term: Increase workloads from initial exercise prescription for resistance, speed, and METs.;Short Term: Perform resistance training exercises routinely during rehab and add in resistance training at home;Long Term: Improve cardiorespiratory fitness, muscular endurance and strength as measured by increased METs and functional capacity (6MWT) Short Term: Increase workloads from initial exercise prescription for resistance, speed, and METs.;Short Term: Perform resistance training exercises routinely during rehab and add in resistance training at home;Long Term: Improve cardiorespiratory fitness, muscular endurance and strength as measured by increased METs and functional capacity (6MWT) Short Term: Increase workloads from initial exercise prescription for resistance, speed, and METs.;Short Term: Perform resistance training exercises routinely during rehab and add in resistance training at home;Long Term: Improve cardiorespiratory fitness, muscular endurance and strength as measured by increased METs and functional capacity (6MWT) Short Term: Increase workloads from initial exercise prescription for resistance, speed, and METs.;Short Term: Perform  resistance training exercises routinely during rehab and add in resistance training at home;Long Term: Improve cardiorespiratory fitness, muscular endurance and strength as measured by increased METs and functional capacity (6MWT)   Able to understand and use rate of perceived exertion (RPE) scale Yes Yes Yes Yes Yes   Intervention Provide education and explanation on how to use RPE scale Provide education and explanation on how to use RPE scale Provide education and explanation on how to use RPE scale Provide education and explanation on how to use RPE scale Provide education and explanation on how to use RPE scale   Expected Outcomes Short Term: Able to use RPE daily in rehab to express subjective intensity level;Long Term:  Able to use RPE to guide intensity level when exercising independently Short Term: Able to use RPE daily in rehab to express subjective intensity level;Long Term:  Able to use RPE to guide intensity level when exercising independently Short Term: Able to use RPE daily in rehab to express subjective intensity level;Long Term:  Able to use RPE to guide intensity level when exercising independently Short Term: Able to use RPE daily in rehab to express subjective intensity level;Long Term:  Able to use RPE to guide intensity level when exercising independently Short Term: Able to use RPE daily in rehab to express subjective intensity level;Long Term:  Able to use RPE to guide intensity level when exercising independently   Able to understand and use Dyspnea scale Yes Yes Yes Yes Yes   Intervention Provide education and explanation on how to use Dyspnea scale Provide education and explanation on how to use Dyspnea scale Provide education and explanation on how to use Dyspnea scale Provide education and  explanation on how to use Dyspnea scale Provide education and explanation on how to use Dyspnea scale   Expected Outcomes Short Term: Able to use Dyspnea scale daily in rehab to express  subjective sense of shortness of breath during exertion;Long Term: Able to use Dyspnea scale to guide intensity level when exercising independently Short Term: Able to use Dyspnea scale daily in rehab to express subjective sense of shortness of breath during exertion;Long Term: Able to use Dyspnea scale to guide intensity level when exercising independently Short Term: Able to use Dyspnea scale daily in rehab to express subjective sense of shortness of breath during exertion;Long Term: Able to use Dyspnea scale to guide intensity level when exercising independently Short Term: Able to use Dyspnea scale daily in rehab to express subjective sense of shortness of breath during exertion;Long Term: Able to use Dyspnea scale to guide intensity level when exercising independently Short Term: Able to use Dyspnea scale daily in rehab to express subjective sense of shortness of breath during exertion;Long Term: Able to use Dyspnea scale to guide intensity level when exercising independently   Knowledge and understanding of Target Heart Rate Range (THRR) Yes Yes Yes Yes Yes   Intervention Provide education and explanation of THRR including how the numbers were predicted and where they are located for reference Provide education and explanation of THRR including how the numbers were predicted and where they are located for reference Provide education and explanation of THRR including how the numbers were predicted and where they are located for reference Provide education and explanation of THRR including how the numbers were predicted and where they are located for reference Provide education and explanation of THRR including how the numbers were predicted and where they are located for reference   Expected Outcomes Short Term: Able to state/look up THRR;Long Term: Able to use THRR to govern intensity when exercising independently;Short Term: Able to use daily as guideline for intensity in rehab Short Term: Able to  state/look up THRR;Long Term: Able to use THRR to govern intensity when exercising independently;Short Term: Able to use daily as guideline for intensity in rehab Short Term: Able to state/look up THRR;Long Term: Able to use THRR to govern intensity when exercising independently;Short Term: Able to use daily as guideline for intensity in rehab Short Term: Able to state/look up THRR;Long Term: Able to use THRR to govern intensity when exercising independently;Short Term: Able to use daily as guideline for intensity in rehab Short Term: Able to state/look up THRR;Long Term: Able to use THRR to govern intensity when exercising independently;Short Term: Able to use daily as guideline for intensity in rehab   Understanding of Exercise Prescription Yes Yes Yes Yes Yes   Intervention Provide education, explanation, and written materials on patient's individual exercise prescription Provide education, explanation, and written materials on patient's individual exercise prescription Provide education, explanation, and written materials on patient's individual exercise prescription Provide education, explanation, and written materials on patient's individual exercise prescription Provide education, explanation, and written materials on patient's individual exercise prescription   Expected Outcomes Short Term: Able to explain program exercise prescription;Long Term: Able to explain home exercise prescription to exercise independently Short Term: Able to explain program exercise prescription;Long Term: Able to explain home exercise prescription to exercise independently Short Term: Able to explain program exercise prescription;Long Term: Able to explain home exercise prescription to exercise independently Short Term: Able to explain program exercise prescription;Long Term: Able to explain home exercise prescription to exercise independently Short Term: Able to  explain program exercise prescription;Long Term: Able to explain  home exercise prescription to exercise independently          Exercise Goals Re-Evaluation :  Exercise Goals Re-Evaluation    Row Name 11/01/20 0744 11/29/20 1458 12/27/20 1544 01/24/21 1543       Exercise Goal Re-Evaluation   Exercise Goals Review Increase Strength and Stamina;Increase Physical Activity;Able to understand and use rate of perceived exertion (RPE) scale;Knowledge and understanding of Target Heart Rate Range (THRR);Able to check pulse independently;Understanding of Exercise Prescription Increase Strength and Stamina;Increase Physical Activity;Able to understand and use rate of perceived exertion (RPE) scale;Knowledge and understanding of Target Heart Rate Range (THRR);Able to check pulse independently;Understanding of Exercise Prescription Increase Strength and Stamina;Increase Physical Activity;Able to understand and use rate of perceived exertion (RPE) scale;Knowledge and understanding of Target Heart Rate Range (THRR);Able to check pulse independently;Understanding of Exercise Prescription Increase Strength and Stamina;Increase Physical Activity;Able to understand and use rate of perceived exertion (RPE) scale;Knowledge and understanding of Target Heart Rate Range (THRR);Able to check pulse independently;Understanding of Exercise Prescription    Comments Patient has completed 4 exercise sessions. He has tolerated exercise well and has settled in to the routine. He keeps setting goals for himself to complete on the NuStep. He is progressing quickly. He is currently exercising at 2.2 METs on the NuStep. Will continue to monitor and progress as able. Patient has completed 11 sessions of pulmonary rehab. He has been able to tolerate exercise well has been progressing through the program. He has been able to use the elliptical for one of his stations now which is more challenging for him. He is currently exercising at 2.5 METs on the stepper. Will continue to monitor and progress as able.  Pt has completed 19 rehab sessions. He continues to progress well and continues to increase his workloads. He is currently exercising at 2.5 METs on the stepper. Will continue to monitor and progress as able. Patient has completed 27 exercise sessions. He is continuing to progress and push himself in rehab. He is increasing his workloads frequently. He is getting stronger in the program. He is continuing to exercise at home. He is very eager and energetic at rehab. He is very interactive with the staff and the other people in his class. He is currenlty exercising at 2.7 METs on the NuStep. Will continue to monitor and progress as able.    Expected Outcomes Through exercise at rehab and with a home exercise program, patient will achieve their goals. Through exercise at rehab and with a home exercise program, patient will achieve their goals. Through exercise at rehab and with a home exercise program, patient will achieve their goals. Through exercise at rehab and with a home exercise program, patient will achieve their goals.           Discharge Exercise Prescription (Final Exercise Prescription Changes):  Exercise Prescription Changes - 01/24/21 1500      Response to Exercise   Blood Pressure (Admit) 124/70    Blood Pressure (Exercise) 170/78    Blood Pressure (Exit) 122/70    Heart Rate (Admit) 68 bpm    Heart Rate (Exercise) 124 bpm    Heart Rate (Exit) 78 bpm    Oxygen Saturation (Admit) 96 %    Oxygen Saturation (Exercise) 95 %    Oxygen Saturation (Exit) 93 %    Rating of Perceived Exertion (Exercise) 10    Perceived Dyspnea (Exercise) 10    Duration Continue with 30 min of  aerobic exercise without signs/symptoms of physical distress.    Intensity THRR unchanged      Progression   Progression Continue to progress workloads to maintain intensity without signs/symptoms of physical distress.      Resistance Training   Training Prescription Yes    Weight 5 lbs    Reps 10-15    Time  10 Minutes      NuStep   Level 5    SPM 110    Minutes 22    METs 2.7      Recumbant Elliptical   Level 6    RPM 59    Minutes 17    METs 5.1           Nutrition:  Target Goals: Understanding of nutrition guidelines, daily intake of sodium 1500mg , cholesterol 200mg , calories 30% from fat and 7% or less from saturated fats, daily to have 5 or more servings of fruits and vegetables.  Biometrics:  Pre Biometrics - 01/24/21 1544      Pre Biometrics   Weight 76 kg            Nutrition Therapy Plan and Nutrition Goals:  Nutrition Therapy & Goals - 01/16/21 1359      Personal Nutrition Goals   Comments We will continue to provide nutrition education through handouts.      Intervention Plan   Intervention Nutrition handout(s) given to patient.           Nutrition Assessments:  Nutrition Assessments - 10/17/20 1340      MEDFICTS Scores   Pre Score 62          MEDIFICTS Score Key:  ?70 Need to make dietary changes   40-70 Heart Healthy Diet  ? 40 Therapeutic Level Cholesterol Diet   Picture Your Plate Scores:  [id number] Unhealthy dietary pattern with much room for improvement.  41-50 Dietary pattern unlikely to meet recommendations for good health and room for improvement.  51-60 More healthful dietary pattern, with some room for improvement.   >60 Healthy dietary pattern, although there may be some specific behaviors that could be improved.    Nutrition Goals Re-Evaluation:   Nutrition Goals Discharge (Final Nutrition Goals Re-Evaluation):   Psychosocial: Target Goals: Acknowledge presence or absence of significant depression and/or stress, maximize coping skills, provide positive support system. Participant is able to verbalize types and ability to use techniques and skills needed for reducing stress and depression.  Initial Review & Psychosocial Screening:  Initial Psych Review & Screening - 10/17/20 1318      Initial Review   Current issues  with Current Sleep Concerns   He has a hard time falling asleep and staying asleep. He cannot sleep more than a couple of hours at a time. He has been tested for OSA, but he has not gotten his results back yet.     Family Dynamics   Good Support System? Yes    Comments His older brother and younger brother support him. He has some friends that he can call and talk to about his problems.      Barriers   Psychosocial barriers to participate in program There are no identifiable barriers or psychosocial needs.      Screening Interventions   Interventions Encouraged to exercise    Expected Outcomes Short Term goal: Utilizing psychosocial counselor, staff and physician to assist with identification of specific Stressors or current issues interfering with healing process. Setting desired goal for each stressor or current issue identified.;Long Term  goal: The participant improves quality of Life and PHQ9 Scores as seen by post scores and/or verbalization of changes           Quality of Life Scores:  Quality of Life - 10/17/20 1456      Quality of Life   Select Quality of Life      Quality of Life Scores   Health/Function Pre 21.19 %    Socioeconomic Pre 26.92 %    Psych/Spiritual Pre 24.29 %    Family Pre 25.5 %    GLOBAL Pre 23.34 %          Scores of 19 and below usually indicate a poorer quality of life in these areas.  A difference of  2-3 points is a clinically meaningful difference.  A difference of 2-3 points in the total score of the Quality of Life Index has been associated with significant improvement in overall quality of life, self-image, physical symptoms, and general health in studies assessing change in quality of life.   PHQ-9: Recent Review Flowsheet Data    Depression screen [facility] 2/9 10/17/2020   Decreased Interest 1   Down, Depressed, Hopeless 0   PHQ - 2 Score 1   Altered sleeping 2   Tired, decreased energy 1   Change in appetite 0   Feeling bad or failure  about yourself  0   Trouble concentrating 0   Moving slowly or fidgety/restless 0   Suicidal thoughts 0   PHQ-9 Score 4   Difficult doing work/chores Not difficult at all     Interpretation of Total Score  Total Score Depression Severity:  1-4 = Minimal depression, 5-9 = Mild depression, 10-14 = Moderate depression, 15-19 = Moderately severe depression, 20-27 = Severe depression   Psychosocial Evaluation and Intervention:  Psychosocial Evaluation - 10/17/20 1455      Psychosocial Evaluation & Interventions   Interventions Encouraged to exercise with the program and follow exercise prescription    Comments Patient has no identifiable psychosocial concerns at orientation.    Expected Outcomes Patient will continue to not have any identifiable psychosocial concerns throughout his time is the program.           Psychosocial Re-Evaluation:  Psychosocial Re-Evaluation    Row Name 10/28/20 0827 11/21/20 1434 12/21/20 0835 01/16/21 1356       Psychosocial Re-Evaluation   Current issues with Current Stress Concerns None Identified None Identified None Identified    Comments Patient is new to the program completing 3 sessions. His initial QOL score was 23.34 and his PHQ-9 score was 5. He is using Vistaril 25 mg q6 hours for itching and sleep but he does use it for sleep. He seems to enjoy coming to the program. We will continue to monitor. Patient continues to have no psychosocial issues identified.  He has completed 8 sessions with consistent attendance. He demonstrates a very positive attitude and works hard during the sessions. Will continue to monitor Patient continues to have no psychosocial issues identified.  He has completed 17 sessions with consistent attendance. He demonstrates a very positive attitude and works hard during the sessions. Enjoys coming to class and interacting alot with staff and group members. Has a positive outlook and continue to work hard in [place]. Will continue to  monitor Patient continues to have no psychosocial issues identified.  He has completed 24 sessions with consistent attendance. He demonstrates a very positive attitude and works hard during the sessions. Enjoys coming to class and interacting  alot with staff and group members. Has a positive outlook and continue to work hard in [place]. Will continue to monitor    Expected Outcomes Patient's sleep with be managed at [facility] with no additional psychosocial issues identifiied. Patient will have no psychosocial issues identified at discharge. Patient will have no psychosocial issues identified at discharge. Patient will have no psychosocial issues identified at discharge.    Interventions Encouraged to attend Pulmonary Rehabilitation for the exercise;Relaxation education;Stress management education Encouraged to attend Pulmonary Rehabilitation for the exercise;Relaxation education;Stress management education Encouraged to attend Pulmonary Rehabilitation for the exercise;Relaxation education;Stress management education Encouraged to attend Pulmonary Rehabilitation for the exercise;Relaxation education;Stress management education    Continue Psychosocial Services  No Follow up required No Follow up required No Follow up required No Follow up required         Initial Review   Source of Stress Concerns None Identified None Identified None Identified None Identified           Psychosocial Discharge (Final Psychosocial Re-Evaluation):  Psychosocial Re-Evaluation - 01/16/21 1356      Psychosocial Re-Evaluation   Current issues with None Identified    Comments Patient continues to have no psychosocial issues identified.  He has completed 24 sessions with consistent attendance. He demonstrates a very positive attitude and works hard during the sessions. Enjoys coming to class and interacting alot with staff and group members. Has a positive outlook and continue to work hard in [place]. Will continue to monitor     Expected Outcomes Patient will have no psychosocial issues identified at discharge.    Interventions Encouraged to attend Pulmonary Rehabilitation for the exercise;Relaxation education;Stress management education    Continue Psychosocial Services  No Follow up required      Initial Review   Source of Stress Concerns None Identified            Education: Education Goals: Education classes will be provided on a weekly basis, covering required topics. Participant will state understanding/return demonstration of topics presented.  Learning Barriers/Preferences:  Learning Barriers/Preferences - 10/17/20 1320      Learning Barriers/Preferences   Learning Barriers None    Learning Preferences Pictoral;Skilled Demonstration           Education Topics: How Lungs Work and Diseases: - Discuss the anatomy of the lungs and diseases that can affect the lungs, such as COPD. Flowsheet Row PULMONARY REHAB CHRONIC OBSTRUCTIVE PULMONARY DISEASE from 01/19/2021 in [place]  Date 12/29/20  Educator DJ  Instruction Review Code 1- Verbalizes Understanding      Exercise: -Discuss the importance of exercise, FITT principles of exercise, normal and abnormal responses to exercise, and how to exercise safely.   Environmental Irritants: -Discuss types of environmental irritants and how to limit exposure to environmental irritants. Flowsheet Row PULMONARY REHAB CHRONIC OBSTRUCTIVE PULMONARY DISEASE from 01/19/2021 in [place]  Date 01/05/21  Educator [facility]  Instruction Review Code 1- Verbalizes Understanding      Meds/Inhalers and oxygen: - Discuss respiratory medications, definition of an inhaler and oxygen, and the proper way to use an inhaler and oxygen. Flowsheet Row PULMONARY REHAB CHRONIC OBSTRUCTIVE PULMONARY DISEASE from 01/19/2021 in [place]  Date 01/12/21  Educator [facility]      Energy Saving Techniques: - Discuss  methods to conserve energy and decrease shortness of breath when performing activities of daily living.  Flowsheet Row PULMONARY REHAB CHRONIC OBSTRUCTIVE PULMONARY DISEASE from 01/19/2021 in [place]  Date  10/20/20  Educator Hand Out  Instruction Review Code 1- Verbalizes Understanding      Bronchial Hygiene / Breathing Techniques: - Discuss breathing mechanics, pursed-lip breathing technique,  proper posture, effective ways to clear airways, and other functional breathing techniques Flowsheet Row PULMONARY REHAB CHRONIC OBSTRUCTIVE PULMONARY DISEASE from 01/19/2021 in [place]  Date 10/27/20  Educator pb  Instruction Review Code 1- Verbalizes Understanding      Cleaning Equipment: - Provides group verbal and written instruction about the health risks of elevated stress, cause of high stress, and healthy ways to reduce stress. Flowsheet Row PULMONARY REHAB CHRONIC OBSTRUCTIVE PULMONARY DISEASE from 01/19/2021 in [place]  Date 11/03/20  Educator Handout  Instruction Review Code 1- Verbalizes Understanding      Nutrition I: Fats: - Discuss the types of cholesterol, what cholesterol does to the body, and how cholesterol levels can be controlled. Flowsheet Row PULMONARY REHAB CHRONIC OBSTRUCTIVE PULMONARY DISEASE from 01/19/2021 in [place]  Date 11/10/20  Educator handout  Instruction Review Code 1- Verbalizes Understanding      Nutrition II: Labels: -Discuss the different components of food labels and how to read food labels. Flowsheet Row PULMONARY REHAB CHRONIC OBSTRUCTIVE PULMONARY DISEASE from 01/19/2021 in [place]  Date 11/17/20  Educator DF  Instruction Review Code 2- Demonstrated Understanding      Respiratory Infections: - Discuss the signs and symptoms of respiratory infections, ways to prevent respiratory infections, and the importance of  seeking medical treatment when having a respiratory infection. Flowsheet Row PULMONARY REHAB CHRONIC OBSTRUCTIVE PULMONARY DISEASE from 01/19/2021 in [place]  Date 11/24/20  Educator DF  Instruction Review Code 2- Demonstrated Understanding      Stress I: Signs and Symptoms: - Discuss the causes of stress, how stress may lead to anxiety and depression, and ways to limit stress. Flowsheet Row PULMONARY REHAB CHRONIC OBSTRUCTIVE PULMONARY DISEASE from 01/19/2021 in [place]  Date 12/01/20  Educator DF  Instruction Review Code 1- Verbalizes Understanding      Stress II: Relaxation: -Discuss relaxation techniques to limit stress.   Oxygen for Home/Travel: - Discuss how to prepare for travel when on oxygen and proper ways to transport and store oxygen to ensure safety. Flowsheet Row PULMONARY REHAB CHRONIC OBSTRUCTIVE PULMONARY DISEASE from 01/19/2021 in [place]  Date 12/15/20  Educator DJ  Instruction Review Code 1- Verbalizes Understanding      Knowledge Questionnaire Score:  Knowledge Questionnaire Score - 10/17/20 1326      Knowledge Questionnaire Score   Pre Score 14/18           Core Components/Risk Factors/Patient Goals at Admission:  Personal Goals and Risk Factors at Admission - 10/17/20 1321      Core Components/Risk Factors/Patient Goals on Admission    Weight Management Yes;Weight Loss    Intervention Weight Management: Develop a combined nutrition and exercise program designed to reach desired caloric intake, while maintaining appropriate intake of nutrient and fiber, sodium and fats, and appropriate energy expenditure required for the weight goal.;Weight Management: Provide education and appropriate resources to help participant work on and attain dietary goals.;Weight Management/Obesity: Establish reasonable short term and long term weight goals.;Obesity: Provide education and appropriate  resources to help participant work on and attain dietary goals.    Expected Outcomes Short Term: Continue to assess and modify interventions until short term weight is achieved;Long Term: Adherence to nutrition and physical activity/exercise program aimed  toward attainment of established weight goal;Weight Maintenance: Understanding of the daily nutrition guidelines, which includes 25-35% calories from fat, 7% or less cal from saturated fats, less than 200mg  cholesterol, less than 1.5gm of sodium, & 5 or more servings of fruits and vegetables daily;Weight Loss: Understanding of general recommendations for a balanced deficit meal plan, which promotes 1-2 lb weight loss per week and includes a negative energy balance of [phone] kcal/d;Understanding of distribution of calorie intake throughout the day with the consumption of 4-5 meals/snacks;Understanding recommendations for meals to include 15-35% energy as protein, 25-35% energy from fat, 35-60% energy from carbohydrates, less than 200mg  of dietary cholesterol, 20-35 gm of total fiber daily;Weight Gain: Understanding of general recommendations for a high calorie, high protein meal plan that promotes weight gain by distributing calorie intake throughout the day with the consumption for 4-5 meals, snacks, and/or supplements    Tobacco Cessation Yes    Number of packs per day 1/2    Intervention Assist the participant in steps to quit. Provide individualized education and counseling about committing to Tobacco Cessation, relapse prevention, and pharmacological support that can be provided by physician.;Advice worker, assist with locating and accessing local/national Quit Smoking programs, and support quit date choice.    Expected Outcomes Short Term: Will quit all tobacco product use, adhering to prevention of relapse plan.;Long Term: Complete abstinence from all tobacco products for at least 12 months from quit date.;Short Term: Will demonstrate  readiness to quit, by selecting a quit date.    Improve shortness of breath with ADL's Yes    Intervention Provide education, individualized exercise plan and daily activity instruction to help decrease symptoms of SOB with activities of daily living.    Expected Outcomes Short Term: Improve cardiorespiratory fitness to achieve a reduction of symptoms when performing ADLs;Long Term: Be able to perform more ADLs without symptoms or delay the onset of symptoms           Core Components/Risk Factors/Patient Goals Review:   Goals and Risk Factor Review    Row Name 10/28/20 0829 11/21/20 1456 12/21/20 0904 01/16/21 1400       Core Components/Risk Factors/Patient Goals Review   Personal Goals Review Improve shortness of breath with ADL's;Tobacco Cessation;Weight Management/Obesity Improve shortness of breath with ADL's;Tobacco Cessation Tobacco Cessation;Improve shortness of breath with ADL's Tobacco Cessation;Improve shortness of breath with ADL's    Review Patient is new to the program completing 3 sessions. He was referred to pulmonary rehab with COPD. He continues to smoke 1/2 ppd cigerattes. His personal goals for the program are to breathe better and get stronger. We will continue to monitor as he works toward meeting these goals. Patient has completed 8 sessions and maintianing his weight. He continues to smoke, will encourge to start a smoking cessation plan. His personal goals are to get stronger and breath better. His O2 sats average in the upper 90's Doing good with Purse lipped breathing techniques.  We will continue to monitor as patient works toward meeting these goals. Patient has completed 17 sessions and maintianing his weight. He continues to smoke, will encourge  smoking cessation . His personal goals are to get stronger and breath better. His O2 sats average in the upper 95-97%  Doing good with Purse lipped breathing techniques.  We will continue to monitor as patient works toward  meeting these goals. Patient has completed 24 sessions and maintianing his weight. He continues to smoke, will encourge  smoking cessation . His personal goals are  to get stronger and breath better. His O2 sats average in the upper 93-97%  Doing good with Purse lipped breathing techniques. He seems to enjoy coming to class and is very interative with others in class and with staff. We will continue to monitor as patient works toward meeting these goals.    Expected Outcomes Patient will complete the program meeting both personal and program goal.s Patient will complete the program meeting both personal and program goal.s Patient will complete the program meeting both personal and program goals. Patient will complete the program meeting both personal and program goals.           Core Components/Risk Factors/Patient Goals at Discharge (Final Review):   Goals and Risk Factor Review - 01/16/21 1400      Core Components/Risk Factors/Patient Goals Review   Personal Goals Review Tobacco Cessation;Improve shortness of breath with ADL's    Review Patient has completed 24 sessions and maintianing his weight. He continues to smoke, will encourge  smoking cessation . His personal goals are to get stronger and breath better. His O2 sats average in the upper 93-97%  Doing good with Purse lipped breathing techniques. He seems to enjoy coming to class and is very interative with others in class and with staff. We will continue to monitor as patient works toward meeting these goals.    Expected Outcomes Patient will complete the program meeting both personal and program goals.           ITP Comments:   Comments: ITP REVIEW Pt is making expected progress toward pulmonary rehab goals after completing 28 sessions. Recommend continued exercise, life style modification, education, and utilization of breathing techniques to increase stamina and strength and decrease shortness of breath with exertion.

## 2021-01-26 NOTE — Progress Notes (Signed)
Daily Session Note  Patient Details  Name: Glen Cobb MRN: [id number] Date of Birth: 1955/10/29 Referring Provider:   Flowsheet Row PULMONARY REHAB COPD ORIENTATION from 10/17/2020 in [place]  Referring Provider Dr. Rosaria Ferries      Encounter Date: 01/26/2021  Check In:  Session Check In - 01/26/21 1330      Check-In   Supervising physician immediately available to respond to emergencies [facility] MD immediately available    Physician(s) Dr. Domenic Polite    Location [facility]    Staff Present Cathren Harsh, MS, Exercise Physiologist;Phyllis Billingsley, RN    Virtual Visit No    Medication changes reported     No    Fall or balance concerns reported    No    Tobacco Cessation No Change    Warm-up and Cool-down Performed as group-led instruction    Resistance Training Performed Yes    VAD Patient? No    PAD/SET Patient? No      Pain Assessment   Currently in Pain? Yes    Pain Score 3     Pain Location Back    Pain Orientation Right    Pain Descriptors / Indicators Constant    Pain Type Chronic pain    Pain Onset More than a month ago    Pain Frequency Constant    Aggravating Factors  Standing or sitting too long    Pain Relieving Factors Coritsone injections    Multiple Pain Sites Yes    Aggravating Factors  Weigth bearing or exercise    Aggravating Factors  Certain movements.      3rd Pain Site   Pain Score 4    Pain Type Chronic pain    Pain Location Knee    Pain Orientation Left    Pain Descriptors / Indicators Aching;Constant    Pain Frequency Constant      Pain Screening   Pain Relieving Factors Cortisone injections    Effect of Pain on Daily Activities Limits activities at times.           Capillary Blood Glucose: No results found for this or any previous visit (from the past 24 hour(s)).    Social History   Tobacco Use  Smoking Status Current Every Day Smoker  . Packs/day: 0.25  . Years: 46.00  . Pack years:  11.50  . Types: Cigarettes  Smokeless Tobacco Former Systems developer  . Types: Snuff  Tobacco Comment   using a nicotine patch, trying to quit     Goals Met:  Proper associated with RPD/PD & O2 Sat Independence with exercise equipment Improved SOB with ADL's Using PLB without cueing & demonstrates good technique Exercise tolerated well No report of cardiac concerns or symptoms Strength training completed today  Goals Unmet:  Not Applicable  Comments: Check out 1430.   Dr. Kathie Dike is Medical Director for [facility].

## 2021-01-31 ENCOUNTER — Encounter (HOSPITAL_COMMUNITY)
Admission: RE | Admit: 2021-01-31 | Discharge: 2021-01-31 | Disposition: A | Discharge status: Home / Self Care | Payer: No Typology Code available for payment source | Source: Ambulatory Visit | Attending: Critical Care Medicine | Admitting: Critical Care Medicine

## 2021-01-31 ENCOUNTER — Other Ambulatory Visit: Payer: Self-pay

## 2021-01-31 DIAGNOSIS — J449 Chronic obstructive pulmonary disease, unspecified: Secondary | ICD-10-CM | POA: Diagnosis not present

## 2021-01-31 NOTE — Progress Notes (Signed)
Daily Session Note  Patient Details  Name: Glen Cobb MRN: [id number] Date of Birth: 09-26-1955 Referring Provider:   Flowsheet Row PULMONARY REHAB COPD ORIENTATION from 10/17/2020 in [place]  Referring Provider Dr. Rosaria Ferries      Encounter Date: 01/31/2021  Check In:  Session Check In - 01/31/21 1330      Check-In   Supervising physician immediately available to respond to emergencies [facility] MD immediately available    Physician(s) Dr. Domenic Polite    Location [facility]    Staff Present Geanie Cooley, RN;[place] Audria Nine, MS, Exercise Physiologist;[place] N[place] Kris Mouton, MS, ACSM-CEP, Exercise Physiologist    Virtual Visit No    Medication changes reported     No    Fall or balance concerns reported    No    Tobacco Cessation No Change    Warm-up and Cool-down Performed as group-led instruction    Resistance Training Performed Yes    VAD Patient? No    PAD/SET Patient? No      Pain Assessment   Currently in Pain? Yes    Pain Score 4     Pain Location Neck    Pain Orientation Proximal    Pain Descriptors / Indicators Constant    Pain Type Chronic pain    Pain Onset More than a month ago    Pain Frequency Constant    Aggravating Factors  standing or sitting too long    Pain Relieving Factors cortisone injections    Multiple Pain Sites Yes    Aggravating Factors  weight bearing or exercise    Aggravating Factors  certain movements      2nd Pain Site   Pain Score 4    Pain Type Chronic pain    Pain Location Knee    Pain Orientation Left    Pain Descriptors / Indicators Constant    Pain Frequency Constant           Capillary Blood Glucose: No results found for this or any previous visit (from the past 24 hour(s)).    Social History   Tobacco Use  Smoking Status Current Every Day Smoker  . Packs/day: 0.25  . Years: 46.00  . Pack years: 11.50  . Types: Cigarettes  Smokeless Tobacco Former Systems developer  . Types: Snuff  Tobacco  Comment   using a nicotine patch, trying to quit     Goals Met:  Independence with exercise equipment Exercise tolerated well No report of cardiac concerns or symptoms Strength training completed today  Goals Unmet:  Not Applicable  Comments: checkout time is 1430   Dr. Kathie Dike is Medical Director for [facility].

## 2021-02-02 ENCOUNTER — Other Ambulatory Visit: Payer: Self-pay

## 2021-02-02 ENCOUNTER — Encounter (HOSPITAL_COMMUNITY)
Admission: RE | Admit: 2021-02-02 | Discharge: 2021-02-02 | Disposition: A | Discharge status: Home / Self Care | Payer: No Typology Code available for payment source | Source: Ambulatory Visit | Attending: Critical Care Medicine | Admitting: Critical Care Medicine

## 2021-02-02 DIAGNOSIS — J449 Chronic obstructive pulmonary disease, unspecified: Secondary | ICD-10-CM

## 2021-02-02 NOTE — Progress Notes (Signed)
Daily Session Note  Patient Details  Name: Glen Cobb MRN: [id number] Date of Birth: 09-22-1956 Referring Provider:   Flowsheet Row PULMONARY REHAB COPD ORIENTATION from 10/17/2020 in [place]  Referring Provider Dr. Rosaria Ferries      Encounter Date: 02/02/2021  Check In:   Capillary Blood Glucose: No results found for this or any previous visit (from the past 24 hour(s)).    Social History   Tobacco Use  Smoking Status Current Every Day Smoker  . Packs/day: 0.25  . Years: 46.00  . Pack years: 11.50  . Types: Cigarettes  Smokeless Tobacco Former Systems developer  . Types: Snuff  Tobacco Comment   using a nicotine patch, trying to quit     Goals Met:  Proper associated with RPD/PD & O2 Sat Independence with exercise equipment Exercise tolerated well No report of cardiac concerns or symptoms Strength training completed today  Goals Unmet:  Not Applicable  Comments: check out @ 2:30pm    Dr. Kathie Dike is Medical Director for [facility].

## 2021-02-07 ENCOUNTER — Other Ambulatory Visit: Payer: Self-pay

## 2021-02-07 ENCOUNTER — Encounter (HOSPITAL_COMMUNITY)
Admission: RE | Admit: 2021-02-07 | Discharge: 2021-02-07 | Disposition: A | Discharge status: Home / Self Care | Payer: No Typology Code available for payment source | Source: Ambulatory Visit | Attending: Critical Care Medicine | Admitting: Critical Care Medicine

## 2021-02-07 VITALS — Wt 164.5 lb

## 2021-02-07 DIAGNOSIS — J449 Chronic obstructive pulmonary disease, unspecified: Secondary | ICD-10-CM

## 2021-02-07 NOTE — Progress Notes (Signed)
Daily Session Note  Patient Details  Name: Glen Cobb MRN: [id number] Date of Birth: 1955/11/07 Referring Provider:   Flowsheet Row PULMONARY REHAB COPD ORIENTATION from 10/17/2020 in [place]  Referring Provider Dr. Rosaria Ferries      Encounter Date: 02/07/2021  Check In:  Session Check In - 02/07/21 1330      Check-In   Supervising physician immediately available to respond to emergencies [facility] MD immediately available    Physician(s) Dr. Johnsie Cancel    Location [facility]    Staff Present Cathren Harsh, MS, Exercise Physiologist;Phyllis Billingsley, RN    Virtual Visit No    Medication changes reported     No    Fall or balance concerns reported    No    Tobacco Cessation No Change    Warm-up and Cool-down Performed as group-led instruction    Resistance Training Performed Yes    VAD Patient? No    PAD/SET Patient? No      Pain Assessment   Currently in Pain? Yes    Pain Score 5     Pain Location Back    Pain Orientation Lower    Pain Descriptors / Indicators Constant    Pain Type Chronic pain    Multiple Pain Sites No           Capillary Blood Glucose: No results found for this or any previous visit (from the past 24 hour(s)).    Social History   Tobacco Use  Smoking Status Current Every Day Smoker  . Packs/day: 0.25  . Years: 46.00  . Pack years: 11.50  . Types: Cigarettes  Smokeless Tobacco Former Systems developer  . Types: Snuff  Tobacco Comment   using a nicotine patch, trying to quit     Goals Met:  Independence with exercise equipment Exercise tolerated well No report of cardiac concerns or symptoms Strength training completed today  Goals Unmet:  Not Applicable  Comments: check out 1430   Dr. Kathie Dike is Medical Director for [facility].

## 2021-02-09 ENCOUNTER — Encounter (HOSPITAL_COMMUNITY): Payer: No Typology Code available for payment source

## 2021-02-13 IMAGING — DX DG CHEST 1V PORT
1 series · 1 of 1 positions shown · non-contrast
Comparison: August 21, 2019

CLINICAL DATA: Recent pneumothorax

EXAM:
PORTABLE CHEST 1 VIEW

[chest ap]
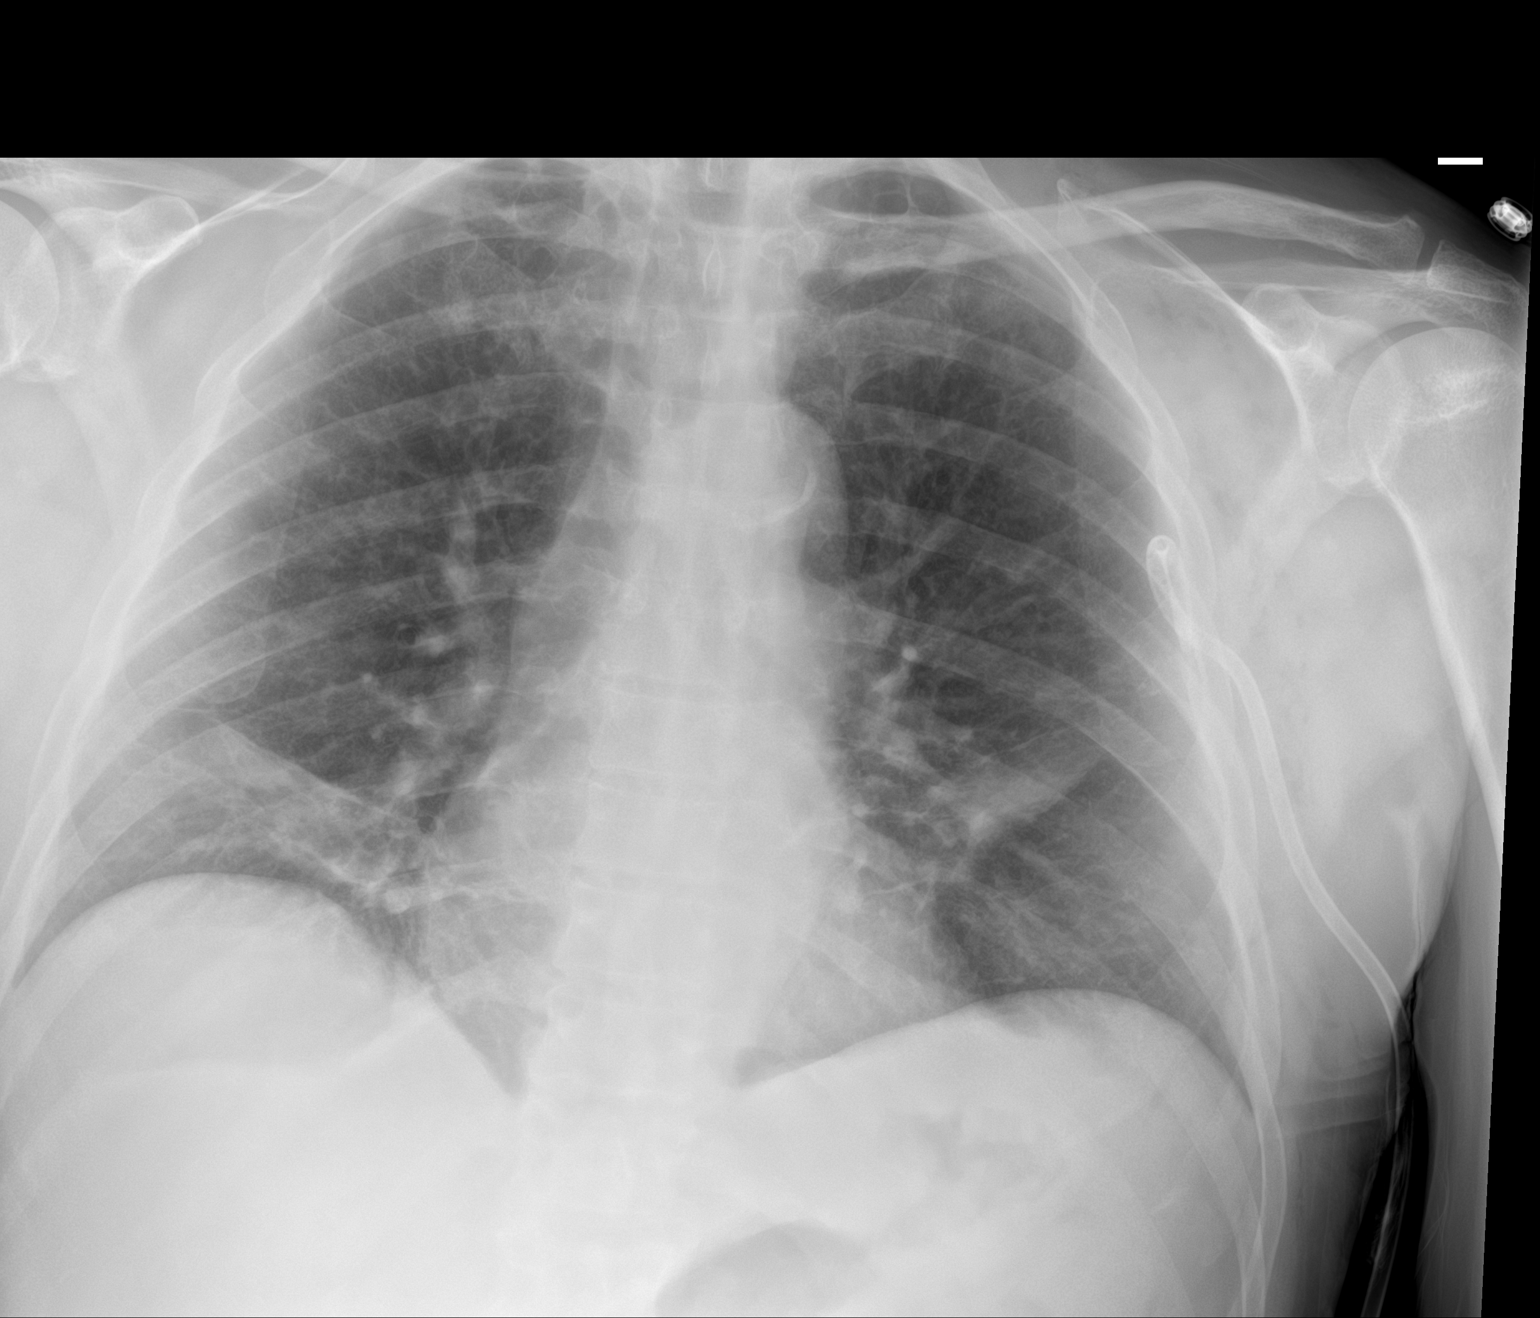

[1 of 1 positions shown; findings below may reference images not displayed]

FINDINGS: Chest tube remains on the left. There is subcutaneous air on the
left. No pneumothorax is evident currently. There is patchy
atelectasis with questionable superimposed infiltrate in the lower
lung regions, stable. There is bullous disease in the apices
bilaterally.

Heart size and pulmonary vascularity are normal. No adenopathy.
There is aortic atherosclerosis. No bone lesions.
IMPRESSION: Stable chest tube position on the left. There is subcutaneous air on
the left but no demonstrable pneumothorax. There is stable
atelectasis with questionable superimposed infiltrate in the lower
lobes. No new opacity. There is bullous disease in the apices.

Stable cardiac silhouette.  Aortic Atherosclerosis ([id number]).

## 2021-02-14 ENCOUNTER — Other Ambulatory Visit: Payer: Self-pay

## 2021-02-14 ENCOUNTER — Encounter (HOSPITAL_COMMUNITY)
Admission: RE | Admit: 2021-02-14 | Discharge: 2021-02-14 | Disposition: A | Discharge status: Home / Self Care | Payer: No Typology Code available for payment source | Source: Ambulatory Visit | Attending: Critical Care Medicine | Admitting: Critical Care Medicine

## 2021-02-14 DIAGNOSIS — J449 Chronic obstructive pulmonary disease, unspecified: Secondary | ICD-10-CM

## 2021-02-14 NOTE — Progress Notes (Signed)
Daily Session Note  Patient Details  Name: Glen Cobb MRN: [id number] Date of Birth: 01-30-1956 Referring Provider:   Flowsheet Row PULMONARY REHAB COPD ORIENTATION from 10/17/2020 in [place]  Referring Provider Dr. Rosaria Ferries      Encounter Date: 02/14/2021  Check In:  Session Check In - 02/14/21 1330      Check-In   Supervising physician immediately available to respond to emergencies [facility] MD immediately available    Physician(s) Dr. Domenic Polite    Location [facility]    Staff Present Geanie Cooley, RN;[place] Audria Nine, MS, Exercise Physiologist    Virtual Visit No    Medication changes reported     No    Fall or balance concerns reported    No    Tobacco Cessation No Change    Warm-up and Cool-down Performed as group-led instruction    Resistance Training Performed Yes    VAD Patient? No    PAD/SET Patient? No      Pain Assessment   Currently in Pain? No/denies    Pain Score 0-No pain           Capillary Blood Glucose: No results found for this or any previous visit (from the past 24 hour(s)).    Social History   Tobacco Use  Smoking Status Current Every Day Smoker  . Packs/day: 0.25  . Years: 46.00  . Pack years: 11.50  . Types: Cigarettes  Smokeless Tobacco Former Systems developer  . Types: Snuff  Tobacco Comment   using a nicotine patch, trying to quit     Goals Met:  Proper associated with RPD/PD & O2 Sat Independence with exercise equipment Improved SOB with ADL's Using PLB without cueing & demonstrates good technique Exercise tolerated well No report of cardiac concerns or symptoms Strength training completed today  Goals Unmet:  Not Applicable  Comments: check out 1330   Dr. Kathie Dike is Medical Director for [facility].

## 2021-02-16 ENCOUNTER — Encounter (HOSPITAL_COMMUNITY): Payer: No Typology Code available for payment source

## 2021-02-21 ENCOUNTER — Encounter (HOSPITAL_COMMUNITY)
Admission: RE | Admit: 2021-02-21 | Discharge: 2021-02-21 | Disposition: A | Discharge status: Home / Self Care | Payer: No Typology Code available for payment source | Source: Ambulatory Visit | Attending: Critical Care Medicine | Admitting: Critical Care Medicine

## 2021-02-21 ENCOUNTER — Other Ambulatory Visit: Payer: Self-pay

## 2021-02-21 VITALS — Wt 163.6 lb

## 2021-02-21 DIAGNOSIS — J449 Chronic obstructive pulmonary disease, unspecified: Secondary | ICD-10-CM | POA: Diagnosis not present

## 2021-02-21 NOTE — Progress Notes (Signed)
Daily Session Note  Patient Details  Name: MIRON MARXEN MRN: [id number] Date of Birth: 1956-07-09 Referring Provider:   Flowsheet Row PULMONARY REHAB COPD ORIENTATION from 10/17/2020 in [place]  Referring Provider Dr. Rosaria Ferries      Encounter Date: 02/21/2021  Check In:  Session Check In - 02/21/21 1330      Check-In   Supervising physician immediately available to respond to emergencies [facility] MD immediately available    Physician(s) Dr. Harl Bowie    Location [facility]    Staff Present Cathren Harsh, MS, Exercise Physiologist;[place]anan Fitzpatrick Kris Mouton, MS, ACSM-CEP, Exercise Physiologist    Virtual Visit No    Medication changes reported     No    Fall or balance concerns reported    No    Tobacco Cessation No Change    Warm-up and Cool-down Performed as group-led instruction    Resistance Training Performed Yes    VAD Patient? No    PAD/SET Patient? No      Pain Assessment   Currently in Pain? No/denies    Pain Score 0-No pain           Capillary Blood Glucose: No results found for this or any previous visit (from the past 24 hour(s)).    Social History   Tobacco Use  Smoking Status Current Every Day Smoker  . Packs/day: 0.25  . Years: 46.00  . Pack years: 11.50  . Types: Cigarettes  Smokeless Tobacco Former Systems developer  . Types: Snuff  Tobacco Comment   using a nicotine patch, trying to quit     Goals Met:  Independence with exercise equipment Exercise tolerated well No report of cardiac concerns or symptoms Strength training completed today  Goals Unmet:  Not Applicable  Comments: checkout time is 1430   Dr. Kathie Dike is Medical Director for [facility] Pul[facility] Re[facility].

## 2021-02-27 NOTE — Progress Notes (Signed)
Discharge Progress Report  Patient Details  Name: Glen Cobb MRN: [id number] Date of Birth: 1956-06-14 Referring Provider:   Flowsheet Row PULMONARY REHAB COPD ORIENTATION from 10/17/2020 in [place]  Referring Provider Dr. Rosaria Ferries       Number of Visits: 34  Reason for Discharge:  Patient reached a stable level of exercise. Patient independent in their exercise. Patient has met program and personal goals.  Smoking History:  Social History   Tobacco Use  Smoking Status Current Every Day Smoker  . Packs/day: 0.25  . Years: 46.00  . Pack years: 11.50  . Types: Cigarettes  Smokeless Tobacco Former Systems developer  . Types: Snuff  Tobacco Comment   using a nicotine patch, trying to quit     Diagnosis:  Chronic obstructive pulmonary disease, unspecified COPD type ([place])  ADL [facility]:  Pulmonary Assessment Scores    Row Name 10/17/20 1329         ADL [facility]   ADL Phase Entry     SOB Score total 24           CAT Score   CAT Score 22           mMRC Score   mMRC Score 2            Initial Exercise Prescription:  Initial Exercise Prescription - 10/17/20 1500      Date of Initial Exercise RX and Referring Provider   Date 10/17/20    Referring Provider Dr. Rosaria Ferries    Expected Discharge Date 01/02/21   This is his current [place]. date. We have requested it be extended.     NuStep   Level 1    SPM 60    Minutes 39      Prescription Details   Frequency (times per week) 2    Duration Progress to 30 minutes of continuous aerobic without signs/symptoms of physical distress      Intensity   THRR 40-80% of Max Heartrate 63-126    Ratings of Perceived Exertion 11-13    Perceived Dyspnea 0-4      Progression   Progression Continue progressive overload as per policy without signs/symptoms or physical distress.      Resistance Training   Training Prescription Yes    Weight 3 lbs    Reps 10-15           Discharge Exercise Prescription  (Final Exercise Prescription Changes):  Exercise Prescription Changes - 02/21/21 1400      Response to Exercise   Blood Pressure (Admit) 128/72    Blood Pressure (Exercise) 136/62    Blood Pressure (Exit) 114/72    Heart Rate (Admit) 78 bpm    Heart Rate (Exercise) 114 bpm    Heart Rate (Exit) 91 bpm    Oxygen Saturation (Admit) 97 %    Oxygen Saturation (Exercise) 94 %    Oxygen Saturation (Exit) 96 %    Rating of Perceived Exertion (Exercise) 10    Perceived Dyspnea (Exercise) 9    Duration Continue with 30 min of aerobic exercise without signs/symptoms of physical distress.    Intensity THRR unchanged      Progression   Progression Continue to progress workloads to maintain intensity without signs/symptoms of physical distress.      Resistance Training   Training Prescription Yes    Weight 5 lbs    Reps 10-15    Time 10 Minutes      NuStep   Level 6  SPM 97    Minutes 22    METs 2.7      Recumbant Elliptical   Level 6    RPM 69    Minutes 17    METs 6           Functional Capacity:  6 Minute Walk    Row Name 10/17/20 1459 02/21/21 1354       6 Minute Walk   Phase Initial Discharge    Distance 1100 feet 1250 feet    Walk Time 6 minutes 6 minutes    # of Rest Breaks 0 0    MPH 2.08 2.4    METS 3.14 2.84    RPE 9 9    Perceived Dyspnea  11 9    VO2 Peak 10.99 9.93    Symptoms Yes (comment) Yes (comment)    Comments lower back pain 5/10, left foot pain 3/10, right hip pain 3/10 left knee pain 3/10    Resting HR 77 bpm 78 bpm    Resting BP 132/78 128/72    Resting Oxygen Saturation  94 % 97 %    Exercise Oxygen Saturation  during 6 min walk 88 % 95 %    Max Ex. HR 94 bpm 94 bpm    Max Ex. BP 142/78 136/64    2 Minute Post BP 128/76 124/64         Interval HR   1 Minute HR 92 91    2 Minute HR 91 93    3 Minute HR 92 94    4 Minute HR 92 91    5 Minute HR 94 94    6 Minute HR 90 91    2 Minute Post HR 87 83    Interval Heart Rate? Yes Yes          Interval Oxygen   Interval Oxygen? Yes Yes    Baseline Oxygen Saturation % 94 % 97 %    1 Minute Oxygen Saturation % 92 % 95 %    1 Minute Liters of Oxygen 0 L 0 L    2 Minute Oxygen Saturation % 92 % 95 %    2 Minute Liters of Oxygen 0 L 0 L    3 Minute Oxygen Saturation % 91 % 95 %    3 Minute Liters of Oxygen 0 L 0 L    4 Minute Oxygen Saturation % 91 % 95 %    4 Minute Liters of Oxygen 0 L 0 L    5 Minute Oxygen Saturation % 89 % 95 %    5 Minute Liters of Oxygen 0 L 0 L    6 Minute Oxygen Saturation % 88 % 95 %    6 Minute Liters of Oxygen 0 L 0 L    2 Minute Post Oxygen Saturation % 97 % 96 %    2 Minute Post Liters of Oxygen 0 L 0 L           Psychological, QOL, Others - Outcomes: PHQ 2/9: Depression screen [facility] 2/9 02/27/2021 10/17/2020  Decreased Interest 2 1  Down, Depressed, Hopeless 0 0  PHQ - 2 Score 2 1  Altered sleeping 3 2  Tired, decreased energy 2 1  Change in appetite 2 0  Feeling bad or failure about yourself  0 0  Trouble concentrating 0 0  Moving slowly or fidgety/restless 1 0  Suicidal thoughts 0 0  PHQ-9 Score 10 4  Difficult doing work/chores Somewhat  difficult Not difficult at all    Quality of Life:  Quality of Life - 02/27/21 1319      Quality of Life   Select Quality of Life      Quality of Life Scores   Health/Function Pre 21.19 %    Health/Function Post 17.84 %    Health/Function % Change -15.81 %    Socioeconomic Pre 26.92 %    Socioeconomic Post 19.94 %    Socioeconomic % Change  -25.93 %    Psych/Spiritual Pre 24.29 %    Psych/Spiritual Post 23.86 %    Psych/Spiritual % Change -1.77 %    Family Pre 25.5 %    Family Post 19.2 %    Family % Change -24.71 %    GLOBAL Pre 23.34 %    GLOBAL Post 19.67 %    GLOBAL % Change -15.72 %           Personal Goals: Goals established at orientation with interventions provided to work toward goal.  Personal Goals and Risk Factors at Admission - 10/17/20 1321      Core  Components/Risk Factors/Patient Goals on Admission    Weight Management Yes;Weight Loss    Intervention Weight Management: Develop a combined nutrition and exercise program designed to reach desired caloric intake, while maintaining appropriate intake of nutrient and fiber, sodium and fats, and appropriate energy expenditure required for the weight goal.;Weight Management: Provide education and appropriate resources to help participant work on and attain dietary goals.;Weight Management/Obesity: Establish reasonable short term and long term weight goals.;Obesity: Provide education and appropriate resources to help participant work on and attain dietary goals.    Expected Outcomes Short Term: Continue to assess and modify interventions until short term weight is achieved;Long Term: Adherence to nutrition and physical activity/exercise program aimed toward attainment of established weight goal;Weight Maintenance: Understanding of the daily nutrition guidelines, which includes 25-35% calories from fat, 7% or less cal from saturated fats, less than 276m cholesterol, less than 1.5gm of sodium, & 5 or more servings of fruits and vegetables daily;Weight Loss: Understanding of general recommendations for a balanced deficit meal plan, which promotes 1-2 lb weight loss per week and includes a negative energy balance of [phone] kcal/d;Understanding of distribution of calorie intake throughout the day with the consumption of 4-5 meals/snacks;Understanding recommendations for meals to include 15-35% energy as protein, 25-35% energy from fat, 35-60% energy from carbohydrates, less than 2071mof dietary cholesterol, 20-35 gm of total fiber daily;Weight Gain: Understanding of general recommendations for a high calorie, high protein meal plan that promotes weight gain by distributing calorie intake throughout the day with the consumption for 4-5 meals, snacks, and/or supplements    Tobacco Cessation Yes    Number of packs per  day 1/2    Intervention Assist the participant in steps to quit. Provide individualized education and counseling about committing to Tobacco Cessation, relapse prevention, and pharmacological support that can be provided by physician.;OfAdvice workerassist with locating and accessing local/national Quit Smoking programs, and support quit date choice.    Expected Outcomes Short Term: Will quit all tobacco product use, adhering to prevention of relapse plan.;Long Term: Complete abstinence from all tobacco products for at least 12 months from quit date.;Short Term: Will demonstrate readiness to quit, by selecting a quit date.    Improve shortness of breath with ADL's Yes    Intervention Provide education, individualized exercise plan and daily activity instruction to help decrease symptoms of SOB with activities of  daily living.    Expected Outcomes Short Term: Improve cardiorespiratory fitness to achieve a reduction of symptoms when performing ADLs;Long Term: Be able to perform more ADLs without symptoms or delay the onset of symptoms            Personal Goals Discharge:  Goals and Risk Factor Review    Row Name 10/28/20 0829 11/21/20 1456 12/21/20 0904 01/16/21 1400 02/27/21 1323     Core Components/Risk Factors/Patient Goals Review   Personal Goals Review Improve shortness of breath with ADL's;Tobacco Cessation;Weight Management/Obesity Improve shortness of breath with ADL's;Tobacco Cessation Tobacco Cessation;Improve shortness of breath with ADL's Tobacco Cessation;Improve shortness of breath with ADL's Tobacco Cessation;Improve shortness of breath with ADL's   Review Patient is new to the program completing 3 sessions. He was referred to pulmonary rehab with COPD. He continues to smoke 1/2 ppd cigerattes. His personal goals for the program are to breathe better and get stronger. We will continue to monitor as he works toward meeting these goals. Patient has completed 8 sessions and  maintianing his weight. He continues to smoke, will encourge to start a smoking cessation plan. His personal goals are to get stronger and breath better. His O2 sats average in the upper 90's Doing good with Purse lipped breathing techniques.  We will continue to monitor as patient works toward meeting these goals. Patient has completed 17 sessions and maintianing his weight. He continues to smoke, will encourge  smoking cessation . His personal goals are to get stronger and breath better. His O2 sats average in the upper 95-97%  Doing good with Purse lipped breathing techniques.  We will continue to monitor as patient works toward meeting these goals. Patient has completed 24 sessions and maintianing his weight. He continues to smoke, will encourge  smoking cessation . His personal goals are to get stronger and breath better. His O2 sats average in the upper 93-97%  Doing good with Purse lipped breathing techniques. He seems to enjoy coming to class and is very interative with others in class and with staff. We will continue to monitor as patient works toward meeting these goals. Patient has completed 34 sessions before discharge. He does continue to smoke. We have encouraged smoking cessation. He has lost almost 12 lbs since admission in the program. His personal goals are to get stronger and to breath better. He has gotten stronger while in the program. He has improved his shortness of breath. He reports that ADLs are easier. He is going to continue exercise at home and/or in our maintenance program to keep improving and reaching his goals.   Expected Outcomes Patient will complete the program meeting both personal and program goal.s Patient will complete the program meeting both personal and program goal.s Patient will complete the program meeting both personal and program goals. Patient will complete the program meeting both personal and program goals. Patient will complete the program meeting both personal  and program goals.          Exercise Goals and Review:  Exercise Goals    Row Name 10/17/20 1506 11/02/20 0744 11/29/20 1457 12/27/20 1543 01/24/21 1415     Exercise Goals   Increase Physical Activity _0    Intervention Provide advice, education, support and counseling about physical activity/exercise needs.;Develop an individualized exercise prescription for aerobic and resistive training based on initial evaluation findings, risk stratification, comorbidities and participant's personal goals. Provide advice, education, support and counseling about physical activity/exercise needs.;Develop an individualized exercise  prescription for aerobic and resistive training based on initial evaluation findings, risk stratification, comorbidities and participant's personal goals. Provide advice, education, support and counseling about physical activity/exercise needs.;Develop an individualized exercise prescription for aerobic and resistive training based on initial evaluation findings, risk stratification, comorbidities and participant's personal goals. Provide advice, education, support and counseling about physical activity/exercise needs.;Develop an individualized exercise prescription for aerobic and resistive training based on initial evaluation findings, risk stratification, comorbidities and participant's personal goals. Provide advice, education, support and counseling about physical activity/exercise needs.;Develop an individualized exercise prescription for aerobic and resistive training based on initial evaluation findings, risk stratification, comorbidities and participant's personal goals.   Expected Outcomes Short Term: Attend rehab on a regular basis to increase amount of physical activity.;Long Term: Add in home exercise to make exercise part of routine and to increase amount of physical activity.;Long Term: Exercising regularly at least 3-5 days a week. Short Term: Attend rehab on  a regular basis to increase amount of physical activity.;Long Term: Add in home exercise to make exercise part of routine and to increase amount of physical activity.;Long Term: Exercising regularly at least 3-5 days a week. Short Term: Attend rehab on a regular basis to increase amount of physical activity.;Long Term: Add in home exercise to make exercise part of routine and to increase amount of physical activity.;Long Term: Exercising regularly at least 3-5 days a week. Short Term: Attend rehab on a regular basis to increase amount of physical activity.;Long Term: Add in home exercise to make exercise part of routine and to increase amount of physical activity.;Long Term: Exercising regularly at least 3-5 days a week. Short Term: Attend rehab on a regular basis to increase amount of physical activity.;Long Term: Add in home exercise to make exercise part of routine and to increase amount of physical activity.;Long Term: Exercising regularly at least 3-5 days a week.   Increase Strength and Stamina _0    Intervention Provide advice, education, support and counseling about physical activity/exercise needs.;Develop an individualized exercise prescription for aerobic and resistive training based on initial evaluation findings, risk stratification, comorbidities and participant's personal goals. Provide advice, education, support and counseling about physical activity/exercise needs.;Develop an individualized exercise prescription for aerobic and resistive training based on initial evaluation findings, risk stratification, comorbidities and participant's personal goals. Provide advice, education, support and counseling about physical activity/exercise needs.;Develop an individualized exercise prescription for aerobic and resistive training based on initial evaluation findings, risk stratification, comorbidities and participant's personal goals. Provide advice, education, support and counseling about  physical activity/exercise needs.;Develop an individualized exercise prescription for aerobic and resistive training based on initial evaluation findings, risk stratification, comorbidities and participant's personal goals. Provide advice, education, support and counseling about physical activity/exercise needs.;Develop an individualized exercise prescription for aerobic and resistive training based on initial evaluation findings, risk stratification, comorbidities and participant's personal goals.   Expected Outcomes Short Term: Increase workloads from initial exercise prescription for resistance, speed, and METs.;Short Term: Perform resistance training exercises routinely during rehab and add in resistance training at home;Long Term: Improve cardiorespiratory fitness, muscular endurance and strength as measured by increased METs and functional capacity (6MWT) Short Term: Increase workloads from initial exercise prescription for resistance, speed, and METs.;Short Term: Perform resistance training exercises routinely during rehab and add in resistance training at home;Long Term: Improve cardiorespiratory fitness, muscular endurance and strength as measured by increased METs and functional capacity (6MWT) Short Term: Increase workloads from initial exercise prescription for resistance, speed, and METs.;Short Term: Perform  resistance training exercises routinely during rehab and add in resistance training at home;Long Term: Improve cardiorespiratory fitness, muscular endurance and strength as measured by increased METs and functional capacity (6MWT) Short Term: Increase workloads from initial exercise prescription for resistance, speed, and METs.;Short Term: Perform resistance training exercises routinely during rehab and add in resistance training at home;Long Term: Improve cardiorespiratory fitness, muscular endurance and strength as measured by increased METs and functional capacity (6MWT) Short Term: Increase  workloads from initial exercise prescription for resistance, speed, and METs.;Short Term: Perform resistance training exercises routinely during rehab and add in resistance training at home;Long Term: Improve cardiorespiratory fitness, muscular endurance and strength as measured by increased METs and functional capacity (6MWT)   Able to understand and use rate of perceived exertion (RPE) scale _0    Intervention Provide education and explanation on how to use RPE scale Provide education and explanation on how to use RPE scale Provide education and explanation on how to use RPE scale Provide education and explanation on how to use RPE scale Provide education and explanation on how to use RPE scale   Expected Outcomes Short Term: Able to use RPE daily in rehab to express subjective intensity level;Long Term:  Able to use RPE to guide intensity level when exercising independently Short Term: Able to use RPE daily in rehab to express subjective intensity level;Long Term:  Able to use RPE to guide intensity level when exercising independently Short Term: Able to use RPE daily in rehab to express subjective intensity level;Long Term:  Able to use RPE to guide intensity level when exercising independently Short Term: Able to use RPE daily in rehab to express subjective intensity level;Long Term:  Able to use RPE to guide intensity level when exercising independently Short Term: Able to use RPE daily in rehab to express subjective intensity level;Long Term:  Able to use RPE to guide intensity level when exercising independently   Able to understand and use Dyspnea scale _1    Intervention Provide education and explanation on how to use Dyspnea scale Provide education and explanation on how to use Dyspnea scale Provide education and explanation on how to use Dyspnea scale Provide education and explanation on how to use Dyspnea scale Provide education and explanation on how to use Dyspnea  scale   Expected Outcomes Short Term: Able to use Dyspnea scale daily in rehab to express subjective sense of shortness of breath during exertion;Long Term: Able to use Dyspnea scale to guide intensity level when exercising independently Short Term: Able to use Dyspnea scale daily in rehab to express subjective sense of shortness of breath during exertion;Long Term: Able to use Dyspnea scale to guide intensity level when exercising independently Short Term: Able to use Dyspnea scale daily in rehab to express subjective sense of shortness of breath during exertion;Long Term: Able to use Dyspnea scale to guide intensity level when exercising independently Short Term: Able to use Dyspnea scale daily in rehab to express subjective sense of shortness of breath during exertion;Long Term: Able to use Dyspnea scale to guide intensity level when exercising independently Short Term: Able to use Dyspnea scale daily in rehab to express subjective sense of shortness of breath during exertion;Long Term: Able to use Dyspnea scale to guide intensity level when exercising independently   Knowledge and understanding of Target Heart Rate Range (THRR) _2    Intervention Provide education and explanation of THRR including how the numbers were predicted and  where they are located for reference Provide education and explanation of THRR including how the numbers were predicted and where they are located for reference Provide education and explanation of THRR including how the numbers were predicted and where they are located for reference Provide education and explanation of THRR including how the numbers were predicted and where they are located for reference Provide education and explanation of THRR including how the numbers were predicted and where they are located for reference   Expected Outcomes Short Term: Able to state/look up THRR;Long Term: Able to use THRR to govern intensity when exercising  independently;Short Term: Able to use daily as guideline for intensity in rehab Short Term: Able to state/look up THRR;Long Term: Able to use THRR to govern intensity when exercising independently;Short Term: Able to use daily as guideline for intensity in rehab Short Term: Able to state/look up THRR;Long Term: Able to use THRR to govern intensity when exercising independently;Short Term: Able to use daily as guideline for intensity in rehab Short Term: Able to state/look up THRR;Long Term: Able to use THRR to govern intensity when exercising independently;Short Term: Able to use daily as guideline for intensity in rehab Short Term: Able to state/look up THRR;Long Term: Able to use THRR to govern intensity when exercising independently;Short Term: Able to use daily as guideline for intensity in rehab   Understanding of Exercise Prescription _0    Intervention Provide education, explanation, and written materials on patient's individual exercise prescription Provide education, explanation, and written materials on patient's individual exercise prescription Provide education, explanation, and written materials on patient's individual exercise prescription Provide education, explanation, and written materials on patient's individual exercise prescription Provide education, explanation, and written materials on patient's individual exercise prescription   Expected Outcomes Short Term: Able to explain program exercise prescription;Long Term: Able to explain home exercise prescription to exercise independently Short Term: Able to explain program exercise prescription;Long Term: Able to explain home exercise prescription to exercise independently Short Term: Able to explain program exercise prescription;Long Term: Able to explain home exercise prescription to exercise independently Short Term: Able to explain program exercise prescription;Long Term: Able to explain home exercise prescription to exercise  independently Short Term: Able to explain program exercise prescription;Long Term: Able to explain home exercise prescription to exercise independently   [place] Name 02/21/21 1359             Exercise Goals   Increase Physical Activity Yes       Intervention Provide advice, education, support and counseling about physical activity/exercise needs.;Develop an individualized exercise prescription for aerobic and resistive training based on initial evaluation findings, risk stratification, comorbidities and participant's personal goals.       Expected Outcomes Short Term: Attend rehab on a regular basis to increase amount of physical activity.;Long Term: Add in home exercise to make exercise part of routine and to increase amount of physical activity.;Long Term: Exercising regularly at least 3-5 days a week.       Increase Strength and Stamina Yes       Intervention Provide advice, education, support and counseling about physical activity/exercise needs.;Develop an individualized exercise prescription for aerobic and resistive training based on initial evaluation findings, risk stratification, comorbidities and participant's personal goals.       Expected Outcomes Short Term: Increase workloads from initial exercise prescription for resistance, speed, and METs.;Short Term: Perform resistance training exercises routinely during rehab and add in resistance training at home;Long Term: Improve cardiorespiratory fitness, muscular endurance  and strength as measured by increased METs and functional capacity (6MWT)       Able to understand and use rate of perceived exertion (RPE) scale Yes       Intervention Provide education and explanation on how to use RPE scale       Expected Outcomes Short Term: Able to use RPE daily in rehab to express subjective intensity level;Long Term:  Able to use RPE to guide intensity level when exercising independently       Able to understand and use Dyspnea scale Yes        Intervention Provide education and explanation on how to use Dyspnea scale       Expected Outcomes Short Term: Able to use Dyspnea scale daily in rehab to express subjective sense of shortness of breath during exertion;Long Term: Able to use Dyspnea scale to guide intensity level when exercising independently       Knowledge and understanding of Target Heart Rate Range (THRR) Yes       Intervention Provide education and explanation of THRR including how the numbers were predicted and where they are located for reference       Expected Outcomes Short Term: Able to state/look up THRR;Long Term: Able to use THRR to govern intensity when exercising independently;Short Term: Able to use daily as guideline for intensity in rehab       Understanding of Exercise Prescription Yes       Intervention Provide education, explanation, and written materials on patient's individual exercise prescription       Expected Outcomes Short Term: Able to explain program exercise prescription;Long Term: Able to explain home exercise prescription to exercise independently              Exercise Goals Re-Evaluation:  Exercise Goals Re-Evaluation    Row Name 11/01/20 0744 11/29/20 1458 12/27/20 1544 01/24/21 1543 02/21/21 1400     Exercise Goal Re-Evaluation   Exercise Goals Review Increase Strength and Stamina;Increase Physical Activity;Able to understand and use rate of perceived exertion (RPE) scale;Knowledge and understanding of Target Heart Rate Range (THRR);Able to check pulse independently;Understanding of Exercise Prescription Increase Strength and Stamina;Increase Physical Activity;Able to understand and use rate of perceived exertion (RPE) scale;Knowledge and understanding of Target Heart Rate Range (THRR);Able to check pulse independently;Understanding of Exercise Prescription Increase Strength and Stamina;Increase Physical Activity;Able to understand and use rate of perceived exertion (RPE) scale;Knowledge and  understanding of Target Heart Rate Range (THRR);Able to check pulse independently;Understanding of Exercise Prescription Increase Strength and Stamina;Increase Physical Activity;Able to understand and use rate of perceived exertion (RPE) scale;Knowledge and understanding of Target Heart Rate Range (THRR);Able to check pulse independently;Understanding of Exercise Prescription Increase Strength and Stamina;Increase Physical Activity;Able to understand and use rate of perceived exertion (RPE) scale;Knowledge and understanding of Target Heart Rate Range (THRR);Able to check pulse independently;Understanding of Exercise Prescription   Comments Patient has completed 4 exercise sessions. He has tolerated exercise well and has settled in to the routine. He keeps setting goals for himself to complete on the NuStep. He is progressing quickly. He is currently exercising at 2.2 METs on the NuStep. Will continue to monitor and progress as able. Patient has completed 11 sessions of pulmonary rehab. He has been able to tolerate exercise well has been progressing through the program. He has been able to use the elliptical for one of his stations now which is more challenging for him. He is currently exercising at 2.5 METs on the stepper. Will continue to  monitor and progress as able. Pt has completed 19 rehab sessions. He continues to progress well and continues to increase his workloads. He is currently exercising at 2.5 METs on the stepper. Will continue to monitor and progress as able. Patient has completed 27 exercise sessions. He is continuing to progress and push himself in rehab. He is increasing his workloads frequently. He is getting stronger in the program. He is continuing to exercise at home. He is very eager and energetic at rehab. He is very interactive with the staff and the other people in his class. He is currenlty exercising at 2.7 METs on the NuStep. Will continue to monitor and progress as able. patient has 33  completed exercise sessions. He has continued to push himself in rehab . He has progressed well in rehab. He is graduating today. He has gotten stronger in the program and has improved his aerobic fitness. He increased his 51mt by 150 ft from admission to discharge. He was very eager to get better. He has talked about doing our maintenance program but is unsure if he will be able to. He is currenlty exercising at 6.0 METs on the elliptical.   Expected Outcomes Through exercise at rehab and with a home exercise program, patient will achieve their goals. Through exercise at rehab and with a home exercise program, patient will achieve their goals. Through exercise at rehab and with a home exercise program, patient will achieve their goals. Through exercise at rehab and with a home exercise program, patient will achieve their goals. Through exercise at rehab and with a home exercise program, patient will achieve their goals.   RHomerName 02/27/21 1327             Exercise Goal Re-Evaluation   Exercise Goals Review Increase Strength and Stamina;Increase Physical Activity;Able to understand and use rate of perceived exertion (RPE) scale;Knowledge and understanding of Target Heart Rate Range (THRR);Able to check pulse independently;Understanding of Exercise Prescription       Comments Patient has completed 34 exercise sessions before discharge. He ended rehab exercising at 6 METs. He did very well in the program and is going to continue to exercise at home and/or in our maintenance program.       Expected Outcomes Through exercise at rehab and with a home exercise program, patient will achieve their goals.              Nutrition & Weight - Outcomes:  Pre Biometrics - 02/21/21 1359      Pre Biometrics   Weight 74.2 kg    Waist Circumference 37.5 inches    Hip Circumference 36.5 inches    Waist to Hip Ratio 1.03 %    Triceps Skinfold 8 mm    % Body Fat 22.3 %    Grip Strength 32.8 kg    Flexibility  0 in    Single Leg Stand 60 seconds            Nutrition:  Nutrition Therapy & Goals - 01/16/21 1359      Personal Nutrition Goals   Comments We will continue to provide nutrition education through handouts.      Intervention Plan   Intervention Nutrition handout(s) given to patient.           Nutrition Discharge:  Nutrition Assessments - 02/27/21 1320      MEDFICTS Scores   Pre Score 62    Post Score 101    Score Difference 39  Education Questionnaire Score:  Knowledge Questionnaire Score - 02/27/21 1320      Knowledge Questionnaire Score   Pre Score 14/18    Post Score 17/18          Patient graduated from [facility] today on 02-21-21 after completing 34 sessions. They achieved LTG of 30 minutes of aerobic exercise at Max Met level of 6 All patients vitals are WNL. Discharge instruction has been reviewed in detail and patient stated an understanding of material given. Patient plans to exercise at home. Cardiac Rehab staff will make f/u call. Patient had no complaints of any abnormal S/S or pain on their exit visit.     Goals reviewed with patient; copy given to patient.

## 2021-11-29 ENCOUNTER — Encounter: Payer: Self-pay | Admitting: Gastroenterology

## 2024-01-27 ENCOUNTER — Other Ambulatory Visit (HOSPITAL_COMMUNITY): Payer: Self-pay | Admitting: Nephrology

## 2024-01-27 DIAGNOSIS — R809 Proteinuria, unspecified: Secondary | ICD-10-CM

## 2024-02-07 ENCOUNTER — Ambulatory Visit (HOSPITAL_COMMUNITY)
Admission: RE | Admit: 2024-02-07 | Discharge: 2024-02-07 | Disposition: A | Discharge status: Home / Self Care | Source: Ambulatory Visit | Attending: Nephrology | Admitting: Nephrology

## 2024-02-07 DIAGNOSIS — R809 Proteinuria, unspecified: Secondary | ICD-10-CM | POA: Insufficient documentation

## 2024-08-05 ENCOUNTER — Encounter (HOSPITAL_COMMUNITY)
Admission: RE | Admit: 2024-08-05 | Discharge: 2024-08-05 | Disposition: A | Discharge status: Home / Self Care | Source: Ambulatory Visit | Attending: Internal Medicine | Admitting: Internal Medicine

## 2024-08-05 DIAGNOSIS — J449 Chronic obstructive pulmonary disease, unspecified: Secondary | ICD-10-CM | POA: Insufficient documentation

## 2024-08-10 ENCOUNTER — Encounter (HOSPITAL_COMMUNITY)
Admission: RE | Admit: 2024-08-10 | Discharge: 2024-08-10 | Disposition: A | Discharge status: Home / Self Care | Source: Ambulatory Visit | Attending: Internal Medicine | Admitting: Internal Medicine

## 2024-08-10 VITALS — Ht 67.0 in | Wt 169.7 lb

## 2024-08-10 DIAGNOSIS — J449 Chronic obstructive pulmonary disease, unspecified: Secondary | ICD-10-CM | POA: Diagnosis present

## 2024-08-10 NOTE — Patient Instructions (Signed)
 Patient Instructions  Patient Details  Name: Glen Cobb MRN: [id number] Date of Birth: 04/06/1956 Referring Provider:  Abram Caron CROME, MD  Below are your personal goals for exercise, nutrition, and risk factors. Our goal is to help you stay on track towards obtaining and maintaining these goals. We will be discussing your progress on these goals with you throughout the program.  Initial Exercise Prescription:  Initial Exercise Prescription - 08/10/24 1500       Date of Initial Exercise RX and Referring Provider   Date 08/10/24    Referring Provider Abram Caron MD      Oxygen   Maintain Oxygen Saturation 88% or higher      Treadmill   MPH 1.7    Grade 0.5    Minutes 15    METs 2.42      REL-XR   Level 1    Speed 50    Minutes 15    METs 2.5      Prescription Details   Frequency (times per week) 2    Duration Progress to 30 minutes of continuous aerobic without signs/symptoms of physical distress      Intensity   THRR 40-80% of Max Heartrate 108-139    Ratings of Perceived Exertion 11-13    Perceived Dyspnea 0-4      Progression   Progression Continue to progress workloads to maintain intensity without signs/symptoms of physical distress.      Resistance Training   Training Prescription Yes    Weight 4 lb    Reps 10-15          Exercise Goals: Frequency: Be able to perform aerobic exercise two to three times per week in program working toward 2-5 days per week of home exercise.  Intensity: Work with a perceived exertion of 11 (fairly light) - 15 (hard) while following your exercise prescription.  We will make changes to your prescription with you as you progress through the program.   Duration: Be able to do 30 to 45 minutes of continuous aerobic exercise in addition to a 5 minute warm-up and a 5 minute cool-down routine.   Nutrition Goals: Your personal nutrition goals will be established when you do your nutrition analysis with the  dietician.  The following are general nutrition guidelines to follow: Cholesterol < 200mg /day Sodium < 1500mg /day Fiber: Men over 50 yrs - 30 grams per day  Personal Goals:  Personal Goals and Risk Factors at Admission - 08/10/24 1518       Core Components/Risk Factors/Patient Goals on Admission    Weight Management Yes;Weight Loss;Weight Maintenance    Intervention Weight Management: Provide education and appropriate resources to help participant work on and attain dietary goals.;Weight Management: Develop a combined nutrition and exercise program designed to reach desired caloric intake, while maintaining appropriate intake of nutrient and fiber, sodium and fats, and appropriate energy expenditure required for the weight goal.    Admit Weight 169 lb 11.2 oz (77 kg)    Goal Weight: Short Term 167 lb (75.8 kg)    Goal Weight: Long Term 167 lb (75.8 kg)    Expected Outcomes Weight Maintenance: Understanding of the daily nutrition guidelines, which includes 25-35% calories from fat, 7% or less cal from saturated fats, less than 200mg  cholesterol, less than 1.5gm of sodium, & 5 or more servings of fruits and vegetables daily;Short Term: Continue to assess and modify interventions until short term weight is achieved;Long Term: Adherence to nutrition and physical activity/exercise program aimed toward  attainment of established weight goal;Weight Loss: Understanding of general recommendations for a balanced deficit meal plan, which promotes 1-2 lb weight loss per week and includes a negative energy balance of [phone] kcal/d;Understanding recommendations for meals to include 15-35% energy as protein, 25-35% energy from fat, 35-60% energy from carbohydrates, less than 200mg  of dietary cholesterol, 20-35 gm of total fiber daily;Understanding of distribution of calorie intake throughout the day with the consumption of 4-5 meals/snacks    Tobacco Cessation Yes    Number of packs per day 1/2-3/4 packs     Intervention Assist the participant in steps to quit. Provide individualized education and counseling about committing to Tobacco Cessation, relapse prevention, and pharmacological support that can be provided by physician.;Education officer, environmental, assist with locating and accessing local/national Quit Smoking programs, and support quit date choice.    Expected Outcomes Short Term: Will demonstrate readiness to quit, by selecting a quit date.;Short Term: Will quit all tobacco product use, adhering to prevention of relapse plan.;Long Term: Complete abstinence from all tobacco products for at least 12 months from quit date.    Improve shortness of breath with ADL's Yes    Intervention Provide education, individualized exercise plan and daily activity instruction to help decrease symptoms of SOB with activities of daily living.    Expected Outcomes Short Term: Improve cardiorespiratory fitness to achieve a reduction of symptoms when performing ADLs;Long Term: Be able to perform more ADLs without symptoms or delay the onset of symptoms    Increase knowledge of respiratory medications and ability to use respiratory devices properly  Yes    Intervention Provide education and demonstration as needed of appropriate use of medications, inhalers, and oxygen therapy.    Expected Outcomes Short Term: Achieves understanding of medications use. Understands that oxygen is a medication prescribed by physician. Demonstrates appropriate use of inhaler and oxygen therapy.;Long Term: Maintain appropriate use of medications, inhalers, and oxygen therapy.    Diabetes Yes    Intervention Provide education about signs/symptoms and action to take for hypo/hyperglycemia.;Provide education about proper nutrition, including hydration, and aerobic/resistive exercise prescription along with prescribed medications to achieve blood glucose in normal ranges: Fasting glucose 65-99 mg/dL    Expected Outcomes Short Term: Participant  verbalizes understanding of the signs/symptoms and immediate care of hyper/hypoglycemia, proper foot care and importance of medication, aerobic/resistive exercise and nutrition plan for blood glucose control.;Long Term: Attainment of HbA1C < 7%.    Hypertension Yes    Intervention Provide education on lifestyle modifcations including regular physical activity/exercise, weight management, moderate sodium restriction and increased consumption of fresh fruit, vegetables, and low fat dairy, alcohol moderation, and smoking cessation.;Monitor prescription use compliance.    Expected Outcomes Short Term: Continued assessment and intervention until BP is < 140/14mm HG in hypertensive participants. < 130/104mm HG in hypertensive participants with diabetes, heart failure or chronic kidney disease.;Long Term: Maintenance of blood pressure at goal levels.    Lipids Yes    Intervention Provide education and support for participant on nutrition & aerobic/resistive exercise along with prescribed medications to achieve LDL 70mg , HDL >40mg .    Expected Outcomes Short Term: Participant states understanding of desired cholesterol values and is compliant with medications prescribed. Participant is following exercise prescription and nutrition guidelines.;Long Term: Cholesterol controlled with medications as prescribed, with individualized exercise RX and with personalized nutrition plan. Value goals: LDL < 70mg , HDL > 40 mg.          Tobacco Use Initial Evaluation: Social History   Tobacco Use  Smoking Status Every Day   Current packs/day: 0.25   Average packs/day: 0.3 packs/day for 46.0 years (11.5 ttl pk-yrs)   Types: Cigarettes  Smokeless Tobacco Former   Types: Snuff  Tobacco Comments   using a nicotine  patch, trying to quit     Exercise Goals and Review:  Exercise Goals     Row Name 08/10/24 1517             Exercise Goals   Increase Physical Activity Yes       Intervention Provide advice,  education, support and counseling about physical activity/exercise needs.;Develop an individualized exercise prescription for aerobic and resistive training based on initial evaluation findings, risk stratification, comorbidities and participant's personal goals.       Expected Outcomes Short Term: Attend rehab on a regular basis to increase amount of physical activity.;Long Term: Add in home exercise to make exercise part of routine and to increase amount of physical activity.;Long Term: Exercising regularly at least 3-5 days a week.       Increase Strength and Stamina Yes       Intervention Provide advice, education, support and counseling about physical activity/exercise needs.;Develop an individualized exercise prescription for aerobic and resistive training based on initial evaluation findings, risk stratification, comorbidities and participant's personal goals.       Expected Outcomes Short Term: Increase workloads from initial exercise prescription for resistance, speed, and METs.;Short Term: Perform resistance training exercises routinely during rehab and add in resistance training at home;Long Term: Improve cardiorespiratory fitness, muscular endurance and strength as measured by increased METs and functional capacity ( )       Able to understand and use rate of perceived exertion (RPE) scale Yes       Intervention Provide education and explanation on how to use RPE scale       Expected Outcomes Short Term: Able to use RPE daily in rehab to express subjective intensity level;Long Term:  Able to use RPE to guide intensity level when exercising independently       Able to understand and use Dyspnea scale Yes       Intervention Provide education and explanation on how to use Dyspnea scale       Expected Outcomes Short Term: Able to use Dyspnea scale daily in rehab to express subjective sense of shortness of breath during exertion;Long Term: Able to use Dyspnea scale to guide intensity level when  exercising independently       Knowledge and understanding of Target Heart Rate Range (THRR) Yes       Intervention Provide education and explanation of THRR including how the numbers were predicted and where they are located for reference       Expected Outcomes Short Term: Able to state/look up THRR;Long Term: Able to use THRR to govern intensity when exercising independently;Short Term: Able to use daily as guideline for intensity in rehab       Able to check pulse independently Yes       Intervention Provide education and demonstration on how to check pulse in carotid and radial arteries.;Review the importance of being able to check your own pulse for safety during independent exercise       Expected Outcomes Long Term: Able to check pulse independently and accurately;Short Term: Able to explain why pulse checking is important during independent exercise       Understanding of Exercise Prescription Yes       Intervention Provide education, explanation, and written materials on patient's individual  exercise prescription       Expected Outcomes Short Term: Able to explain program exercise prescription;Long Term: Able to explain home exercise prescription to exercise independently        Copy of goals given to participant.

## 2024-08-10 NOTE — Progress Notes (Signed)
 Pulmonary Individual Treatment Plan  Patient Details  Name: Glen Cobb MRN: [id number] Date of Birth: Oct 04, 1955 Referring Provider:   Flowsheet Row PULMONARY REHAB COPD ORIENTATION from 08/10/2024 in [facility] CARDIAC REHABILITATION  Referring Provider Abram Clap MD    Initial Encounter Date:  Flowsheet Row PULMONARY REHAB COPD ORIENTATION from 08/10/2024 in [facility]AC RE[facility]  Date 08/10/24    Visit Diagnosis: Chronic obstructive pulmonary disease, unspecified COPD type (HCC)  Patient's Home Medications on Admission:   Current Outpatient Medications:    Acidophilus Lactobacillus CAPS, Take 2 tablets by mouth daily at 12 noon. (Patient not taking: Reported on 08/05/2024), Disp: , Rfl:    acyclovir (ZOVIRAX) 400 MG tablet, Take 400 mg by mouth 2 (two) times daily as needed (for flareup)., Disp: , Rfl:    albuterol  (PROVENTIL ) (2.5 MG/3ML) 0.083% nebulizer solution, Take 2.5 mg by nebulization every 6 (six) hours as needed., Disp: , Rfl:    albuterol -ipratropium (COMBIVENT ) 18-103 MCG/ACT inhaler, Inhale 1 puff into the lungs every 6 (six) hours as needed., Disp: , Rfl:    allopurinol (ZYLOPRIM) 100 MG tablet, Take 100 mg by mouth daily., Disp: , Rfl:    amLODipine  (NORVASC ) 5 MG tablet, Take 10 mg by mouth daily., Disp: , Rfl:    aspirin 81 MG chewable tablet, Chew 81 mg by mouth daily., Disp: , Rfl:    Azelastine HCl 137 MCG/SPRAY SOLN, Place 1 spray into the nose at bedtime. (Patient not taking: Reported on 08/05/2024), Disp: , Rfl:    budesonide -formoterol  (SYMBICORT) 160-4.5 MCG/ACT inhaler, Inhale 2 puffs into the lungs 2 (two) times daily. (Patient not taking: Reported on 08/05/2024), Disp: , Rfl:    carbidopa-levodopa (SINEMET IR) 25-100 MG tablet, Take 0.5 tablets by mouth 2 (two) times daily., Disp: , Rfl:    colchicine 0.6 MG tablet, Take 0.6 mg by mouth as needed., Disp: , Rfl:    cyclobenzaprine (FLEXERIL) 10 MG tablet, Take 15 mg by mouth at  bedtime., Disp: , Rfl:    fluticasone (FLONASE) 50 MCG/ACT nasal spray, Place 1 spray into both nostrils 3 (three) times daily., Disp: , Rfl:    gabapentin  (NEURONTIN ) 600 MG tablet, Take 1,200 mg by mouth 3 (three) times daily., Disp: , Rfl:    hydroxychloroquine (PLAQUENIL) 200 MG tablet, Take 400 mg by mouth daily. Monday through Friday, then 1 tablet on Saturday and Sunday, Disp: , Rfl:    hydrOXYzine (VISTARIL) 25 MG capsule, Take 25 mg by mouth every 6 (six) hours as needed for itching (sleep)., Disp: , Rfl:    Hypertonic Nasal Wash (SINUS RINSE BOTTLE KIT NA), Place 1 packet into the nose as needed., Disp: , Rfl:    ipratropium (ATROVENT) 0.03 % nasal spray, Place 2 sprays into both nostrils 3 (three) times daily. (Patient not taking: Reported on 08/05/2024), Disp: , Rfl:    lidocaine  (LIDODERM ) 5 %, Place 1 patch onto the skin daily. Remove & Discard patch within 12 hours or as directed by MD, Disp: , Rfl:    loratadine (CLARITIN) 10 MG tablet, Take 10 mg by mouth daily., Disp: , Rfl:    losartan (COZAAR) 50 MG tablet, Take 25 mg by mouth daily., Disp: , Rfl:    metFORMIN (GLUCOPHAGE) 500 MG tablet, Take 500 mg by mouth 2 (two) times daily with a meal., Disp: , Rfl:    Mometasone  Furoate 200 MCG/ACT AERO, Take 1 puff by mouth 2 (two) times daily., Disp: , Rfl:    nicotine  (NICODERM CQ  -  DOSED IN MG/24 HOURS) 21 mg/24hr patch, Place 21 mg onto the skin daily. (Patient not taking: Reported on 08/05/2024), Disp: , Rfl:    nicotine  (NICODERM CQ  - DOSED IN MG/24 HR) 7 mg/24hr patch, Place 7 mg onto the skin daily. (Patient not taking: Reported on 08/05/2024), Disp: , Rfl:    nicotine  polacrilex (COMMIT) 2 MG lozenge, Take 2 mg by mouth as needed for smoking cessation. (Patient not taking: Reported on 08/05/2024), Disp: , Rfl:    omeprazole (PRILOSEC) 40 MG capsule, Take 40 mg by mouth daily., Disp: , Rfl:    potassium chloride  (KLOR-CON ) 10 MEQ tablet, Take 10 mEq by mouth daily. (Patient not  taking: Reported on 08/05/2024), Disp: , Rfl:    predniSONE  (DELTASONE ) 20 MG tablet, Take 3 tablets by mouth daily x1 day; then 2 tablets by mouth daily x2 days; then 1 tablet by mouth daily x3 days; then half tablet by mouth daily x3 days and stop prednisone . (Patient not taking: Reported on 08/05/2024), Disp: 30 tablet, Rfl: 0   Propylene Glycol (SYSTANE BALANCE OP), Apply 1 drop to eye 4 (four) times daily. Both eyes, Disp: , Rfl:    Riboflavin 100 MG TABS, Take 400 mg by mouth daily. (Patient not taking: Reported on 08/05/2024), Disp: , Rfl:    rosuvastatin (CRESTOR) 40 MG tablet, Take 20 mg by mouth daily., Disp: , Rfl:    sildenafil (VIAGRA) 100 MG tablet, Take 50 mg by mouth daily as needed for erectile dysfunction., Disp: , Rfl:    SUMAtriptan (IMITREX) 100 MG tablet, Take 100 mg by mouth as needed., Disp: , Rfl:    Tiotropium Bromide Monohydrate (SPIRIVA RESPIMAT) 2.5 MCG/ACT AERS, Inhale 2 puffs into the lungs daily. (Patient not taking: Reported on 08/05/2024), Disp: , Rfl:    Tiotropium Bromide-Olodaterol 2.5-2.5 MCG/ACT AERS, Take 2 puffs by mouth daily., Disp: , Rfl:    traMADol (ULTRAM) 50 MG tablet, Take 100 mg by mouth 3 (three) times daily as needed for moderate pain., Disp: , Rfl:   Past Medical History: Past Medical History:  Diagnosis Date   Arthritis    Cancer (HCC)    bladder cancer x 2   Cataract    MD just watching   COPD (chronic obstructive pulmonary disease) (HCC)    Emphysema of lung (HCC)    FH: migraines    last one 1 yr ago - OTC med prn   Gout    HSV infection    Hyperlipidemia    no med. diet   Hypertension    Kidney stone    passed stone - no surgery   Osteoporosis    Sleep apnea    Does not use CPAP nightly.    Tobacco Use: Social History   Tobacco Use  Smoking Status Every Day   Current packs/day: 0.25   Average packs/day: 0.3 packs/day for 46.0 years (11.5 ttl pk-yrs)   Types: Cigarettes  Smokeless Tobacco Former   Types: Snuff   Tobacco Comments   using a nicotine  patch, trying to quit     Labs: Review Flowsheet        No data to display          Capillary Blood Glucose: Lab Results  Component Value Date   GLUCAP 191 (H) 08/22/2019   GLUCAP 145 (H) 08/20/2019   GLUCAP 182 (H) 08/19/2019     Pulmonary Assessment Scores:  Pulmonary Assessment Scores     Row Name 08/10/24 1400  ADL UCSD   ADL Phase Entry     SOB Score total 24     Rest 1     Walk 2     Stairs 4     Bath 1     Dress 0     Shop 0       CAT Score   CAT Score 20       mMRC Score   mMRC Score 2       UCSD: Self-administered rating of dyspnea associated with activities of daily living (ADLs) 6-point scale (0 = not at all to 5 = maximal or unable to do because of breathlessness)  Scoring Scores range from 0 to 120.  Minimally important difference is 5 units  CAT: CAT can identify the health impairment of COPD patients and is better correlated with disease progression.  CAT has a scoring range of zero to 40. The CAT score is classified into four groups of low (less than 10), medium (10 - 20), high (21-30) and very high (31-40) based on the impact level of disease on health status. A CAT score over 10 suggests significant symptoms.  A worsening CAT score could be explained by an exacerbation, poor medication adherence, poor inhaler technique, or progression of COPD or comorbid conditions.  CAT MCID is 2 points  mMRC: mMRC (Modified Medical Research Council) Dyspnea Scale is used to assess the degree of baseline functional disability in patients of respiratory disease due to dyspnea. No minimal important difference is established. A decrease in score of 1 point or greater is considered a positive change.   Pulmonary Function Assessment:   Exercise Target Goals: Exercise Program Goal: Individual exercise prescription set using results from initial 6 min walk test and THRR while considering  patient's activity  barriers and safety.   Exercise Prescription Goal: Initial exercise prescription builds to 30-45 minutes a day of aerobic activity, 2-3 days per week.  Home exercise guidelines will be given to patient during program as part of exercise prescription that the participant will acknowledge.  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 HR 96 bpm     Max Ex. BP 144/62     2 Minute Post BP 142/72  Interval HR   1 Minute HR 89     2 Minute HR 92     3 Minute HR 92     4 Minute HR 93     5 Minute HR 96     6 Minute HR 95     2 Minute Post HR 80     Interval Heart Rate? Yes       Interval Oxygen   Interval Oxygen? Yes     Baseline Oxygen Saturation % 96 %     1 Minute Oxygen Saturation % 92 %     1 Minute Liters of Oxygen 0 L  Room AIr     2 Minute Oxygen Saturation % 93 %     2 Minute Liters of Oxygen 0 L     3 Minute Oxygen Saturation % 93 %     3 Minute Liters of Oxygen 0 L     4 Minute Oxygen Saturation % 91 %     4 Minute Liters of Oxygen 0 L     5 Minute Oxygen Saturation % 91 %     5  Minute Liters of Oxygen 0 L     6 Minute Oxygen Saturation % 91 %     6 Minute Liters of Oxygen 0 L     2 Minute Post Oxygen Saturation % 97 %     2 Minute Post Liters of Oxygen 0 L        Oxygen Initial Assessment:  Oxygen Initial Assessment - 08/05/24 1109       Home Oxygen   Home Oxygen Device None    Sleep Oxygen Prescription CPAP   pt does not wear CPAP regularly   Home Exercise Oxygen Prescription None    Home Resting Oxygen Prescription None      Initial 6 min Walk   Oxygen Used None      Program Oxygen Prescription   Program Oxygen Prescription None      Intervention   Short Term Goals To learn and understand importance of monitoring SPO2 with pulse oximeter and demonstrate accurate use of the pulse oximeter.;To learn and understand importance of maintaining oxygen saturations>88%;To learn and demonstrate proper use of respiratory medications;To learn and demonstrate proper pursed lip breathing techniques or other breathing techniques.     Long  Term Goals Verbalizes importance of monitoring SPO2 with pulse oximeter and return demonstration;Maintenance of O2 saturations>88%;Exhibits proper breathing techniques, such as pursed lip breathing or other method taught during program session;Compliance with respiratory medication;Demonstrates proper use of MDI's          Oxygen Re-Evaluation:   Oxygen Discharge (Final Oxygen Re-Evaluation):   Initial Exercise Prescription:  Initial Exercise Prescription - 08/10/24 1500       Date of Initial Exercise RX and Referring Provider   Date 08/10/24    Referring Provider Abram Clap MD      Oxygen   Maintain Oxygen Saturation 88% or higher      Treadmill   MPH 1.7    Grade 0.5    Minutes 15    METs 2.42      REL-XR   Level 1    Speed 50    Minutes 15    METs 2.5      Prescription Details   Frequency (times per week) 2    Duration Progress to 30 minutes of continuous aerobic without signs/symptoms of physical  distress      Intensity   THRR 40-80%  of Max Heartrate 108-139    Ratings of Perceived Exertion 11-13    Perceived Dyspnea 0-4      Progression   Progression Continue to progress workloads to maintain intensity without signs/symptoms of physical distress.      Resistance Training   Training Prescription Yes    Weight 4 lb    Reps 10-15          Perform Capillary Blood Glucose checks as needed.  Exercise Prescription Changes:   Exercise Prescription Changes     Row Name 08/10/24 1500             Response to Exercise   Blood Pressure (Admit) 122/64       Blood Pressure (Exercise) 144/62       Blood Pressure (Exit) 132/70       Heart Rate (Admit) 77 bpm       Heart Rate (Exercise) 96 bpm       Heart Rate (Exit) 68 bpm       Oxygen Saturation (Admit) 96 %       Oxygen Saturation (Exercise) 91 %       Oxygen Saturation (Exit) 100 %       Rating of Perceived Exertion (Exercise) 12       Perceived Dyspnea (Exercise) 2       Symptoms chroinc hip pain 3/10       Comments wakl test results          Exercise Comments:   Exercise Goals and Review:   Exercise Goals     Row Name 08/10/24 1517             Exercise Goals   Increase Physical Activity Yes       Intervention Provide advice, education, support and counseling about physical activity/exercise needs.;Develop an individualized exercise prescription for aerobic and resistive training based on initial evaluation findings, risk stratification, comorbidities and participant's personal goals.       Expected Outcomes Short Term: Attend rehab on a regular basis to increase amount of physical activity.;Long Term: Add in home exercise to make exercise part of routine and to increase amount of physical activity.;Long Term: Exercising regularly at least 3-5 days a week.       Increase Strength and Stamina Yes       Intervention Provide advice, education, support and counseling about physical activity/exercise  needs.;Develop an individualized exercise prescription for aerobic and resistive training based on initial evaluation findings, risk stratification, comorbidities and participant's personal goals.       Expected Outcomes Short Term: Increase workloads from initial exercise prescription for resistance, speed, and METs.;Short Term: Perform resistance training exercises routinely during rehab and add in resistance training at home;Long Term: Improve cardiorespiratory fitness, muscular endurance and strength as measured by increased METs and functional capacity ( )       Able to understand and use rate of perceived exertion (RPE) scale Yes       Intervention Provide education and explanation on how to use RPE scale       Expected Outcomes Short Term: Able to use RPE daily in rehab to express subjective intensity level;Long Term:  Able to use RPE to guide intensity level when exercising independently       Able to understand and use Dyspnea scale Yes       Intervention Provide education and explanation on how to use Dyspnea scale       Expected Outcomes Short Term: Able to use Dyspnea scale  daily in rehab to express subjective sense of shortness of breath during exertion;Long Term: Able to use Dyspnea scale to guide intensity level when exercising independently       Knowledge and understanding of Target Heart Rate Range (THRR) Yes       Intervention Provide education and explanation of THRR including how the numbers were predicted and where they are located for reference       Expected Outcomes Short Term: Able to state/look up THRR;Long Term: Able to use THRR to govern intensity when exercising independently;Short Term: Able to use daily as guideline for intensity in rehab       Able to check pulse independently Yes       Intervention Provide education and demonstration on how to check pulse in carotid and radial arteries.;Review the importance of being able to check your own pulse for safety during  independent exercise       Expected Outcomes Long Term: Able to check pulse independently and accurately;Short Term: Able to explain why pulse checking is important during independent exercise       Understanding of Exercise Prescription Yes       Intervention Provide education, explanation, and written materials on patient's individual exercise prescription       Expected Outcomes Short Term: Able to explain program exercise prescription;Long Term: Able to explain home exercise prescription to exercise independently          Exercise Goals Re-Evaluation :   Discharge Exercise Prescription (Final Exercise Prescription Changes):  Exercise Prescription Changes - 08/10/24 1500       Response to Exercise   Blood Pressure (Admit) 122/64    Blood Pressure (Exercise) 144/62    Blood Pressure (Exit) 132/70    Heart Rate (Admit) 77 bpm    Heart Rate (Exercise) 96 bpm    Heart Rate (Exit) 68 bpm    Oxygen Saturation (Admit) 96 %    Oxygen Saturation (Exercise) 91 %    Oxygen Saturation (Exit) 100 %    Rating of Perceived Exertion (Exercise) 12    Perceived Dyspnea (Exercise) 2    Symptoms chroinc hip pain 3/10    Comments wakl test results          Nutrition:  Target Goals: Understanding of nutrition guidelines, daily intake of sodium 1500mg , cholesterol 200mg , calories 30% from fat and 7% or less from saturated fats, daily to have 5 or more servings of fruits and vegetables.  Biometrics:  Pre Biometrics - 08/10/24 1517       Pre Biometrics   Height 5' 7 (1.702 m)    Weight 77 kg    Waist Circumference 33 inches    Hip Circumference 36 inches    Waist to Hip Ratio 0.92 %    BMI (Calculated) 26.57    Grip Strength 33.2 kg    Single Leg Stand 0 seconds           Nutrition Therapy Plan and Nutrition Goals:  Nutrition Therapy & Goals - 08/10/24 1518       Intervention Plan   Intervention Nutrition handout(s) given to patient.;Prescribe, educate and counsel regarding  individualized specific dietary modifications aiming towards targeted core components such as weight, hypertension, lipid management, diabetes, heart failure and other comorbidities.    Expected Outcomes Short Term Goal: Understand basic principles of dietary content, such as calories, fat, sodium, cholesterol and nutrients.;Long Term Goal: Adherence to prescribed nutrition plan.          Nutrition Assessments:  MEDIFICTS Score Key: >=70 Need to make dietary changes  40-70 Heart Healthy Diet <= 40 Therapeutic Level Cholesterol Diet  Flowsheet Row PULMONARY REHAB COPD ORIENTATION from 08/10/2024 in [facility] CARDIAC REHABILITATION  Picture Your Plate Total Score on Admission 63   Picture Your Plate Scores: <59 Unhealthy dietary pattern with much room for improvement. 41-50 Dietary pattern unlikely to meet recommendations for good health and room for improvement. 51-60 More healthful dietary pattern, with some room for improvement.  >60 Healthy dietary pattern, although there may be some specific behaviors that could be improved.    Nutrition Goals Re-Evaluation:   Nutrition Goals Discharge (Final Nutrition Goals Re-Evaluation):   Psychosocial: Target Goals: Acknowledge presence or absence of significant depression and/or stress, maximize coping skills, provide positive support system. Participant is able to verbalize types and ability to use techniques and skills needed for reducing stress and depression.  Initial Review & Psychosocial Screening:  Initial Psych Review & Screening - 08/05/24 1117       Initial Review   Current issues with Current Sleep Concerns      Family Dynamics   Good Support System? Yes    Comments His brothers are his main support systems, and he also gets together with his half sister.      Barriers   Psychosocial barriers to participate in program There are no identifiable barriers or psychosocial needs.      Screening Interventions    Interventions Encouraged to exercise;Program counselor consult;Provide feedback about the scores to participant    Expected Outcomes Short Term goal: Utilizing psychosocial counselor, staff and physician to assist with identification of specific Stressors or current issues interfering with healing process. Setting desired goal for each stressor or current issue identified.;Long Term Goal: Stressors or current issues are controlled or eliminated.;Short Term goal: Identification and review with participant of any Quality of Life or Depression concerns found by scoring the questionnaire.;Long Term goal: The participant improves quality of Life and PHQ9 Scores as seen by post scores and/or verbalization of changes          Quality of Life Scores:  Scores of 19 and below usually indicate a poorer quality of life in these areas.  A difference of  2-3 points is a clinically meaningful difference.  A difference of 2-3 points in the total score of the Quality of Life Index has been associated with significant improvement in overall quality of life, self-image, physical symptoms, and general health in studies assessing change in quality of life.  PHQ-9: Review Flowsheet       08/10/2024 02/27/2021 10/17/2020  Depression screen PHQ 2/9  Decreased Interest 0 2 1  Down, Depressed, Hopeless 0 0 0  PHQ - 2 Score 0 2 1  Altered sleeping 1 3 2   Tired, decreased energy 1 2 1   Change in appetite 0 2 0  Feeling bad or failure about yourself  0 0 0  Trouble concentrating 0 0 0  Moving slowly or fidgety/restless 0 1 0  Suicidal thoughts 0 0 0  PHQ-9 Score 2 10  4    Difficult doing work/chores Not difficult at all Somewhat difficult Not difficult at all    Details       Data saved with a previous flowsheet row definition        Interpretation of Total Score  Total Score Depression Severity:  1-4 = Minimal depression, 5-9 = Mild depression, 10-14 = Moderate depression, 15-19 = Moderately severe  depression, 20-27 = Severe depression  Psychosocial Evaluation and Intervention:  Psychosocial Evaluation - 08/05/24 1127       Psychosocial Evaluation & Interventions   Interventions Encouraged to exercise with the program and follow exercise prescription    Comments Pt was referred to PR by Dr. Abram at the [facility] for COPD.  The pt has been through our PR program before, and his goals for the program this time are to breathe better and be more mobile.  The pt denied any issues with anxiety, depression, or stress.  He does still wake up at night a few times, but he has a prescription for Hydroxyzine as needed for sleep.  He lives alone and has had several falls over the last year, and when he fell in July of last year he fell off the porch and injured his right eye.  He has arthritis in his neck and back, and he has started using a cane for stability.  His main support system are his brothers and his half sister.  He looks forward to starting the program, and we will monitor his progress.    Expected Outcomes Pt will continue to have no identifiable psychosocial barriers.    Continue Psychosocial Services  Follow up required by staff          Psychosocial Re-Evaluation:   Psychosocial Discharge (Final Psychosocial Re-Evaluation):   Education: Education Goals: Education classes will be provided on a weekly basis, covering required topics. Participant will state understanding/return demonstration of topics presented.  Learning Barriers/Preferences:  Learning Barriers/Preferences - 08/05/24 1110       Learning Barriers/Preferences   Learning Barriers Sight;Exercise Concerns    Learning Preferences Written Material          Education Topics: Know Your Numbers Group instruction that is supported by a PowerPoint presentation. Instructor discusses importance of knowing and understanding resting, exercise, and post-exercise oxygen saturation, heart rate, and blood pressure. Oxygen  saturation, heart rate, blood pressure, rating of perceived exertion, and dyspnea are reviewed along with a normal range for these values.    Exercise for the Pulmonary Patient Group instruction that is supported by a PowerPoint presentation. Instructor discusses benefits of exercise, core components of exercise, frequency, duration, and intensity of an exercise routine, importance of utilizing pulse oximetry during exercise, safety while exercising, and options of places to exercise outside of rehab.    MET Level  Group instruction provided by PowerPoint, verbal discussion, and written material to support subject matter. Instructor reviews what METs are and how to increase METs.    Pulmonary Medications Verbally interactive group education provided by instructor with focus on inhaled medications and proper administration.   Anatomy and Physiology of the Respiratory System Group instruction provided by PowerPoint, verbal discussion, and written material to support subject matter. Instructor reviews respiratory cycle and anatomical components of the respiratory system and their functions. Instructor also reviews differences in obstructive and restrictive respiratory diseases with examples of each.    Oxygen Safety Group instruction provided by PowerPoint, verbal discussion, and written material to support subject matter. There is an overview of "What is Oxygen" and "Why do we need it".  Instructor also reviews how to create a safe environment for oxygen use, the importance of using oxygen as prescribed, and the risks of noncompliance. There is a brief discussion on traveling with oxygen and resources the patient may utilize.   Oxygen Use Group instruction provided by PowerPoint, verbal discussion, and written material to discuss how supplemental oxygen is prescribed and different types of  oxygen supply systems. Resources for more information are provided.    Breathing Techniques Group  instruction that is supported by demonstration and informational handouts. Instructor discusses the benefits of pursed lip and diaphragmatic breathing and detailed demonstration on how to perform both.     Risk Factor Reduction Group instruction that is supported by a PowerPoint presentation. Instructor discusses the definition of a risk factor, different risk factors for pulmonary disease, and how the heart and lungs work together.   Pulmonary Diseases Group instruction provided by PowerPoint, verbal discussion, and written material to support subject matter. Instructor gives an overview of the different type of pulmonary diseases. There is also a discussion on risk factors and symptoms as well as ways to manage the diseases.   Stress and Energy Conservation Group instruction provided by PowerPoint, verbal discussion, and written material to support subject matter. Instructor gives an overview of stress and the impact it can have on the body. Instructor also reviews ways to reduce stress. There is also a discussion on energy conservation and ways to conserve energy throughout the day.   Warning Signs and Symptoms Group instruction provided by PowerPoint, verbal discussion, and written material to support subject matter. Instructor reviews warning signs and symptoms of stroke, heart attack, cold and flu. Instructor also reviews ways to prevent the spread of infection.   Other Education Group or individual verbal, written, or video instructions that support the educational goals of the pulmonary rehab program.    Knowledge Questionnaire Score:  Knowledge Questionnaire Score - 08/10/24 1402       Knowledge Questionnaire Score   Pre Score 14/18          Core Components/Risk Factors/Patient Goals at Admission:  Personal Goals and Risk Factors at Admission - 08/10/24 1518       Core Components/Risk Factors/Patient Goals on Admission    Weight Management Yes;Weight Loss;Weight  Maintenance    Intervention Weight Management: Provide education and appropriate resources to help participant work on and attain dietary goals.;Weight Management: Develop a combined nutrition and exercise program designed to reach desired caloric intake, while maintaining appropriate intake of nutrient and fiber, sodium and fats, and appropriate energy expenditure required for the weight goal.    Admit Weight 169 lb 11.2 oz (77 kg)    Goal Weight: Short Term 167 lb (75.8 kg)    Goal Weight: Long Term 167 lb (75.8 kg)    Expected Outcomes Weight Maintenance: Understanding of the daily nutrition guidelines, which includes 25-35% calories from fat, 7% or less cal from saturated fats, less than 200mg  cholesterol, less than 1.5gm of sodium, & 5 or more servings of fruits and vegetables daily;Short Term: Continue to assess and modify interventions until short term weight is achieved;Long Term: Adherence to nutrition and physical activity/exercise program aimed toward attainment of established weight goal;Weight Loss: Understanding of general recommendations for a balanced deficit meal plan, which promotes 1-2 lb weight loss per week and includes a negative energy balance of [phone] kcal/d;Understanding recommendations for meals to include 15-35% energy as protein, 25-35% energy from fat, 35-60% energy from carbohydrates, less than 200mg  of dietary cholesterol, 20-35 gm of total fiber daily;Understanding of distribution of calorie intake throughout the day with the consumption of 4-5 meals/snacks    Tobacco Cessation Yes    Number of packs per day 1/2-3/4 packs    Intervention Assist the participant in steps to quit. Provide individualized education and counseling about committing to Tobacco Cessation, relapse prevention, and pharmacological support that can  be provided by physician.;Education officer, environmental, assist with locating and accessing local/national Quit Smoking programs, and support quit date  choice.    Expected Outcomes Short Term: Will demonstrate readiness to quit, by selecting a quit date.;Short Term: Will quit all tobacco product use, adhering to prevention of relapse plan.;Long Term: Complete abstinence from all tobacco products for at least 12 months from quit date.    Improve shortness of breath with ADL's Yes    Intervention Provide education, individualized exercise plan and daily activity instruction to help decrease symptoms of SOB with activities of daily living.    Expected Outcomes Short Term: Improve cardiorespiratory fitness to achieve a reduction of symptoms when performing ADLs;Long Term: Be able to perform more ADLs without symptoms or delay the onset of symptoms    Increase knowledge of respiratory medications and ability to use respiratory devices properly  Yes    Intervention Provide education and demonstration as needed of appropriate use of medications, inhalers, and oxygen therapy.    Expected Outcomes Short Term: Achieves understanding of medications use. Understands that oxygen is a medication prescribed by physician. Demonstrates appropriate use of inhaler and oxygen therapy.;Long Term: Maintain appropriate use of medications, inhalers, and oxygen therapy.    Diabetes Yes    Intervention Provide education about signs/symptoms and action to take for hypo/hyperglycemia.;Provide education about proper nutrition, including hydration, and aerobic/resistive exercise prescription along with prescribed medications to achieve blood glucose in normal ranges: Fasting glucose 65-99 mg/dL    Expected Outcomes Short Term: Participant verbalizes understanding of the signs/symptoms and immediate care of hyper/hypoglycemia, proper foot care and importance of medication, aerobic/resistive exercise and nutrition plan for blood glucose control.;Long Term: Attainment of HbA1C < 7%.    Hypertension Yes    Intervention Provide education on lifestyle modifcations including regular  physical activity/exercise, weight management, moderate sodium restriction and increased consumption of fresh fruit, vegetables, and low fat dairy, alcohol moderation, and smoking cessation.;Monitor prescription use compliance.    Expected Outcomes Short Term: Continued assessment and intervention until BP is < 140/33mm HG in hypertensive participants. < 130/73mm HG in hypertensive participants with diabetes, heart failure or chronic kidney disease.;Long Term: Maintenance of blood pressure at goal levels.    Lipids Yes    Intervention Provide education and support for participant on nutrition & aerobic/resistive exercise along with prescribed medications to achieve LDL 70mg , HDL >40mg .    Expected Outcomes Short Term: Participant states understanding of desired cholesterol values and is compliant with medications prescribed. Participant is following exercise prescription and nutrition guidelines.;Long Term: Cholesterol controlled with medications as prescribed, with individualized exercise RX and with personalized nutrition plan. Value goals: LDL < 70mg , HDL > 40 mg.          Core Components/Risk Factors/Patient Goals Review:    Core Components/Risk Factors/Patient Goals at Discharge (Final Review):    ITP Comments:  ITP Comments     Row Name 08/10/24 1419           ITP Comments Patient arrived for 1st visit/orientation/education at 1300. Patient was referred to PR by Dr. Caron Reil from the [facility] due to COPD. During orientation advised patient on arrival and appointment times what to wear, what to do before, during and after exercise. Reviewed attendance and class policy.  Pt is scheduled to return Pulmonary Rehab on 08/13/24 at 1330. Pt was advised to come to class 15 minutes before class starts.  Discussed RPE/Dpysnea scales. Patient participated in warm up stretches. Patient was able to complete  6 minute walk test.  Patient was measured for the equipment. Discussed equipment safety with  patient. Took patient pre-anthropometric measurements. Patient finished visit at 1410.          Comments: Patient arrived for 1st visit/orientation/education at 1300. Patient was referred to PR by Dr. Caron Reil from the [facility] due to COPD. During orientation advised patient on arrival and appointment times what to wear, what to do before, during and after exercise. Reviewed attendance and class policy.  Pt is scheduled to return Pulmonary Rehab on 08/13/24 at 1330. Pt was advised to come to class 15 minutes before class starts.  Discussed RPE/Dpysnea scales. Patient participated in warm up stretches. Patient was able to complete 6 minute walk test.  Patient was measured for the equipment. Discussed equipment safety with patient. Took patient pre-anthropometric measurements. Patient finished visit at 1410.

## 2024-08-13 ENCOUNTER — Encounter (HOSPITAL_COMMUNITY)
Admission: RE | Admit: 2024-08-13 | Discharge: 2024-08-13 | Disposition: A | Discharge status: Home / Self Care | Source: Ambulatory Visit | Attending: Internal Medicine | Admitting: Internal Medicine

## 2024-08-13 DIAGNOSIS — J449 Chronic obstructive pulmonary disease, unspecified: Secondary | ICD-10-CM | POA: Diagnosis not present

## 2024-08-13 LAB — GLUCOSE, CAPILLARY
Glucose-Capillary: 108 mg/dL — ABNORMAL HIGH (ref 70–99)
Glucose-Capillary: 100 mg/dL — ABNORMAL HIGH (ref 70–99)

## 2024-08-13 NOTE — Progress Notes (Signed)
 Daily Session Note  Patient Details  Name: Glen Cobb MRN: [id number] Date of Birth: 12-Jul-1956 Referring Provider:   Flowsheet Row PULMONARY REHAB COPD ORIENTATION from 08/10/2024 in [facility] CARDIAC REHABILITATION  Referring Provider Abram Clap MD    Encounter Date: 08/13/2024  Check In:  Session Check In - 08/13/24 1347       Check-In   Supervising physician immediately available to respond to emergencies See telemetry face sheet for immediately available MD    Location [facility]    Staff Present Powell Benders, BS, Exercise Physiologist;Elyse Prevo Vonzell, MA, RCEP, CCRP, CCET;Hillary Troutman BSN, RN    Virtual Visit No    Medication changes reported     No    Fall or balance concerns reported    No    Warm-up and Cool-down Performed on first and last piece of equipment    Resistance Training Performed Yes    VAD Patient? No    PAD/SET Patient? No      Pain Assessment   Currently in Pain? No/denies          Capillary Blood Glucose: Results for orders placed or performed during the hospital encounter of 08/13/24 (from the past 24 hours)  Glucose, capillary     Status: Abnormal   Collection Time: 08/13/24  1:32 PM  Result Value Ref Range   Glucose-Capillary 108 (H) 70 - 99 mg/dL      Social History   Tobacco Use  Smoking Status Every Day   Current packs/day: 0.25   Average packs/day: 0.3 packs/day for 46.0 years (11.5 ttl pk-yrs)   Types: Cigarettes  Smokeless Tobacco Former   Types: Snuff  Tobacco Comments   using a nicotine  patch, trying to quit     Goals Met:  Proper associated with RPD/PD & O2 Sat Exercise tolerated well Personal goals reviewed Queuing for purse lip breathing No report of concerns or symptoms today Strength training completed today  Goals Unmet:  Not Applicable  Comments: First full day of exercise!  Patient was oriented to gym and equipment including functions, settings, policies, and procedures.   Patient's individual exercise prescription and treatment plan were reviewed.  All starting workloads were established based on the results of the 6 minute walk test done at initial orientation visit.  The plan for exercise progression was also introduced and progression will be customized based on patient's performance and goals.

## 2024-08-18 ENCOUNTER — Encounter (HOSPITAL_COMMUNITY)
Admission: RE | Admit: 2024-08-18 | Discharge: 2024-08-18 | Disposition: A | Discharge status: Home / Self Care | Source: Ambulatory Visit | Attending: Internal Medicine | Admitting: Internal Medicine

## 2024-08-18 DIAGNOSIS — J449 Chronic obstructive pulmonary disease, unspecified: Secondary | ICD-10-CM | POA: Diagnosis not present

## 2024-08-18 LAB — GLUCOSE, CAPILLARY
Glucose-Capillary: 109 mg/dL — ABNORMAL HIGH (ref 70–99)
Glucose-Capillary: 101 mg/dL — ABNORMAL HIGH (ref 70–99)

## 2024-08-18 NOTE — Progress Notes (Signed)
 Daily Session Note  Patient Details  Name: Glen Cobb MRN: [id number] Date of Birth: 1956/07/05 Referring Provider:   Flowsheet Row PULMONARY REHAB COPD ORIENTATION from 08/10/2024 in [facility] CARDIAC REHABILITATION  Referring Provider Abram Clap MD    Encounter Date: 08/18/2024  Check In:  Session Check In - 08/18/24 1339       Check-In   Supervising physician immediately available to respond to emergencies See telemetry face sheet for immediately available MD    Location [facility]    Staff Present Powell Benders, BS, Exercise Physiologist;Brittany Jackquline, BSN, RN, WTA-C;Cambre Matson Zina, RN    Virtual Visit No    Medication changes reported     No    Fall or balance concerns reported    No    Warm-up and Cool-down Performed on first and last piece of equipment    Resistance Training Performed Yes    VAD Patient? No    PAD/SET Patient? No      Pain Assessment   Currently in Pain? No/denies          Capillary Blood Glucose: Results for orders placed or performed during the hospital encounter of 08/18/24 (from the past 24 hours)  Glucose, capillary     Status: Abnormal   Collection Time: 08/18/24  1:35 PM  Result Value Ref Range   Glucose-Capillary 109 (H) 70 - 99 mg/dL      Social History   Tobacco Use  Smoking Status Every Day   Current packs/day: 0.25   Average packs/day: 0.3 packs/day for 46.0 years (11.5 ttl pk-yrs)   Types: Cigarettes  Smokeless Tobacco Former   Types: Snuff  Tobacco Comments   using a nicotine  patch, trying to quit     Goals Met:  Proper associated with RPD/PD & O2 Sat Independence with exercise equipment Using PLB without cueing & demonstrates good technique Exercise tolerated well No report of concerns or symptoms today Strength training completed today  Goals Unmet:  Not Applicable  Comments: Pt able to follow exercise prescription today without complaint.  Will continue to monitor for  progression.

## 2024-08-25 ENCOUNTER — Encounter (HOSPITAL_COMMUNITY): Payer: Self-pay | Admitting: *Deleted

## 2024-08-25 ENCOUNTER — Ambulatory Visit (HOSPITAL_COMMUNITY)

## 2024-08-25 DIAGNOSIS — J449 Chronic obstructive pulmonary disease, unspecified: Secondary | ICD-10-CM

## 2024-08-25 NOTE — Progress Notes (Signed)
 Pulmonary Individual Treatment Plan  Patient Details  Name: Glen Cobb MRN: [id number] Date of Birth: July 30, 1956 Referring Provider:   Flowsheet Row PULMONARY REHAB COPD ORIENTATION from 08/10/2024 in [facility] CARDIAC REHABILITATION  Referring Provider Abram Clap MD    Initial Encounter Date:  Flowsheet Row PULMONARY REHAB COPD ORIENTATION from 08/10/2024 in [facility] [place] CARDIAC REHABILITATION  Date 08/10/24    Visit Diagnosis: Chronic obstructive pulmonary disease, unspecified COPD type (HCC)  Patient's Home Medications on Admission:   Current Outpatient Medications:    Acidophilus Lactobacillus CAPS, Take 2 tablets by mouth daily at 12 noon. (Patient not taking: Reported on 08/05/2024), Disp: , Rfl:    acyclovir (ZOVIRAX) 400 MG tablet, Take 400 mg by mouth 2 (two) times daily as needed (for flareup)., Disp: , Rfl:    albuterol  (PROVENTIL ) (2.5 MG/3ML) 0.083% nebulizer solution, Take 2.5 mg by nebulization every 6 (six) hours as needed., Disp: , Rfl:    albuterol -ipratropium (COMBIVENT ) 18-103 MCG/ACT inhaler, Inhale 1 puff into the lungs every 6 (six) hours as needed., Disp: , Rfl:    allopurinol (ZYLOPRIM) 100 MG tablet, Take 100 mg by mouth daily., Disp: , Rfl:    amLODipine  (NORVASC ) 5 MG tablet, Take 10 mg by mouth daily., Disp: , Rfl:    aspirin 81 MG chewable tablet, Chew 81 mg by mouth daily., Disp: , Rfl:    Azelastine HCl 137 MCG/SPRAY SOLN, Place 1 spray into the nose at bedtime. (Patient not taking: Reported on 08/05/2024), Disp: , Rfl:    budesonide -formoterol  (SYMBICORT) 160-4.5 MCG/ACT inhaler, Inhale 2 puffs into the lungs 2 (two) times daily. (Patient not taking: Reported on 08/05/2024), Disp: , Rfl:    carbidopa-levodopa (SINEMET IR) 25-100 MG tablet, Take 0.5 tablets by mouth 2 (two) times daily., Disp: , Rfl:    colchicine 0.6 MG tablet, Take 0.6 mg by mouth as needed., Disp: , Rfl:    cyclobenzaprine (FLEXERIL) 10 MG tablet, Take 15 mg by mouth at  bedtime., Disp: , Rfl:    fluticasone (FLONASE) 50 MCG/ACT nasal spray, Place 1 spray into both nostrils 3 (three) times daily., Disp: , Rfl:    gabapentin  (NEURONTIN ) 600 MG tablet, Take 1,200 mg by mouth 3 (three) times daily., Disp: , Rfl:    hydroxychloroquine (PLAQUENIL) 200 MG tablet, Take 400 mg by mouth daily. Monday through Friday, then 1 tablet on Saturday and Sunday, Disp: , Rfl:    hydrOXYzine (VISTARIL) 25 MG capsule, Take 25 mg by mouth every 6 (six) hours as needed for itching (sleep)., Disp: , Rfl:    Hypertonic Nasal Wash (SINUS RINSE BOTTLE KIT NA), Place 1 packet into the nose as needed., Disp: , Rfl:    ipratropium (ATROVENT) 0.03 % nasal spray, Place 2 sprays into both nostrils 3 (three) times daily. (Patient not taking: Reported on 08/05/2024), Disp: , Rfl:    lidocaine  (LIDODERM ) 5 %, Place 1 patch onto the skin daily. Remove & Discard patch within 12 hours or as directed by MD, Disp: , Rfl:    loratadine (CLARITIN) 10 MG tablet, Take 10 mg by mouth daily., Disp: , Rfl:    losartan (COZAAR) 50 MG tablet, Take 25 mg by mouth daily., Disp: , Rfl:    metFORMIN (GLUCOPHAGE) 500 MG tablet, Take 500 mg by mouth 2 (two) times daily with a meal., Disp: , Rfl:    Mometasone  Furoate 200 MCG/ACT AERO, Take 1 puff by mouth 2 (two) times daily., Disp: , Rfl:    nicotine  (NICODERM CQ  -  DOSED IN MG/24 HOURS) 21 mg/24hr patch, Place 21 mg onto the skin daily. (Patient not taking: Reported on 08/05/2024), Disp: , Rfl:    nicotine  (NICODERM CQ  - DOSED IN MG/24 HR) 7 mg/24hr patch, Place 7 mg onto the skin daily. (Patient not taking: Reported on 08/05/2024), Disp: , Rfl:    nicotine  polacrilex (COMMIT) 2 MG lozenge, Take 2 mg by mouth as needed for smoking cessation. (Patient not taking: Reported on 08/05/2024), Disp: , Rfl:    omeprazole (PRILOSEC) 40 MG capsule, Take 40 mg by mouth daily., Disp: , Rfl:    potassium chloride  (KLOR-CON ) 10 MEQ tablet, Take 10 mEq by mouth daily. (Patient not  taking: Reported on 08/05/2024), Disp: , Rfl:    predniSONE  (DELTASONE ) 20 MG tablet, Take 3 tablets by mouth daily x1 day; then 2 tablets by mouth daily x2 days; then 1 tablet by mouth daily x3 days; then half tablet by mouth daily x3 days and stop prednisone . (Patient not taking: Reported on 08/05/2024), Disp: 30 tablet, Rfl: 0   Propylene Glycol (SYSTANE BALANCE OP), Apply 1 drop to eye 4 (four) times daily. Both eyes, Disp: , Rfl:    Riboflavin 100 MG TABS, Take 400 mg by mouth daily. (Patient not taking: Reported on 08/05/2024), Disp: , Rfl:    rosuvastatin (CRESTOR) 40 MG tablet, Take 20 mg by mouth daily., Disp: , Rfl:    sildenafil (VIAGRA) 100 MG tablet, Take 50 mg by mouth daily as needed for erectile dysfunction., Disp: , Rfl:    SUMAtriptan (IMITREX) 100 MG tablet, Take 100 mg by mouth as needed., Disp: , Rfl:    Tiotropium Bromide Monohydrate (SPIRIVA RESPIMAT) 2.5 MCG/ACT AERS, Inhale 2 puffs into the lungs daily. (Patient not taking: Reported on 08/05/2024), Disp: , Rfl:    Tiotropium Bromide-Olodaterol 2.5-2.5 MCG/ACT AERS, Take 2 puffs by mouth daily., Disp: , Rfl:    traMADol (ULTRAM) 50 MG tablet, Take 100 mg by mouth 3 (three) times daily as needed for moderate pain., Disp: , Rfl:   Past Medical History: Past Medical History:  Diagnosis Date   Arthritis    Cancer (HCC)    bladder cancer x 2   Cataract    MD just watching   COPD (chronic obstructive pulmonary disease) (HCC)    Emphysema of lung (HCC)    FH: migraines    last one 1 yr ago - OTC med prn   Gout    HSV infection    Hyperlipidemia    no med. diet   Hypertension    Kidney stone    passed stone - no surgery   Osteoporosis    Sleep apnea    Does not use CPAP nightly.    Tobacco Use: Social History   Tobacco Use  Smoking Status Every Day   Current packs/day: 0.25   Average packs/day: 0.3 packs/day for 46.0 years (11.5 ttl pk-yrs)   Types: Cigarettes  Smokeless Tobacco Former   Types: Snuff   Tobacco Comments   using a nicotine  patch, trying to quit     Labs: Review Flowsheet        No data to display          Capillary Blood Glucose: Lab Results  Component Value Date   GLUCAP 101 (H) 08/18/2024   GLUCAP 109 (H) 08/18/2024   GLUCAP 100 (H) 08/13/2024   GLUCAP 108 (H) 08/13/2024   GLUCAP 191 (H) 08/22/2019     Pulmonary Assessment Scores:  Pulmonary Assessment Scores  Row Name 08/10/24 1400         ADL UCSD   ADL Phase Entry     SOB Score total 24     Rest 1     Walk 2     Stairs 4     Bath 1     Dress 0     Shop 0       CAT Score   CAT Score 20       mMRC Score   mMRC Score 2       UCSD: Self-administered rating of dyspnea associated with activities of daily living (ADLs) 6-point scale (0 = not at all to 5 = maximal or unable to do because of breathlessness)  Scoring Scores range from 0 to 120.  Minimally important difference is 5 units  CAT: CAT can identify the health impairment of COPD patients and is better correlated with disease progression.  CAT has a scoring range of zero to 40. The CAT score is classified into four groups of low (less than 10), medium (10 - 20), high (21-30) and very high (31-40) based on the impact level of disease on health status. A CAT score over 10 suggests significant symptoms.  A worsening CAT score could be explained by an exacerbation, poor medication adherence, poor inhaler technique, or progression of COPD or comorbid conditions.  CAT MCID is 2 points  mMRC: mMRC (Modified Medical Research Council) Dyspnea Scale is used to assess the degree of baseline functional disability in patients of respiratory disease due to dyspnea. No minimal important difference is established. A decrease in score of 1 point or greater is considered a positive change.   Pulmonary Function Assessment:   Exercise Target Goals: Exercise Program Goal: Individual exercise prescription set using results from initial 6 min  walk test and THRR while considering  patient's activity barriers and safety.   Exercise Prescription Goal: Initial exercise prescription builds to 30-45 minutes a day of aerobic activity, 2-3 days per week.  Home exercise guidelines will be given to patient during program as part of exercise prescription that the participant will acknowledge.  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 HR 96 bpm     Max Ex. BP 144/62  2 Minute Post BP 142/72       Interval HR   1 Minute HR 89     2 Minute HR 92     3 Minute HR 92     4 Minute HR 93     5 Minute HR 96     6 Minute HR 95     2 Minute Post HR 80     Interval Heart Rate? Yes       Interval Oxygen   Interval Oxygen? Yes     Baseline Oxygen Saturation % 96 %     1 Minute Oxygen Saturation % 92 %     1 Minute Liters of Oxygen 0 L  Room AIr     2 Minute Oxygen Saturation % 93 %     2 Minute Liters of Oxygen 0 L     3 Minute Oxygen Saturation % 93 %     3 Minute Liters of Oxygen 0 L     4 Minute Oxygen Saturation % 91 %     4 Minute Liters of  Oxygen 0 L     5 Minute Oxygen Saturation % 91 %     5 Minute Liters of Oxygen 0 L     6 Minute Oxygen Saturation % 91 %     6 Minute Liters of Oxygen 0 L     2 Minute Post Oxygen Saturation % 97 %     2 Minute Post Liters of Oxygen 0 L        Oxygen Initial Assessment:  Oxygen Initial Assessment - 08/05/24 1109       Home Oxygen   Home Oxygen Device None    Sleep Oxygen Prescription CPAP   pt does not wear CPAP regularly   Home Exercise Oxygen Prescription None    Home Resting Oxygen Prescription None      Initial 6 min Walk   Oxygen Used None      Program Oxygen Prescription   Program Oxygen Prescription None      Intervention   Short Term Goals To learn and understand importance of monitoring SPO2 with pulse oximeter and demonstrate accurate use of the pulse oximeter.;To learn and understand importance of maintaining oxygen saturations>88%;To learn and demonstrate proper use of respiratory medications;To learn and demonstrate proper pursed lip breathing techniques or other breathing techniques.     Long  Term Goals Verbalizes importance of monitoring SPO2 with pulse oximeter and return demonstration;Maintenance of O2 saturations>88%;Exhibits proper breathing techniques, such as pursed lip breathing or other method taught during program session;Compliance with respiratory medication;Demonstrates proper use of MDI's          Oxygen Re-Evaluation:  Oxygen Re-Evaluation     Row Name 08/13/24 1349             Goals/Expected Outcomes   Short Term Goals To learn and understand importance of maintaining oxygen saturations>88%;To learn and understand importance of monitoring SPO2 with pulse oximeter and demonstrate accurate use of the pulse oximeter.;To learn and demonstrate proper pursed lip breathing techniques or other breathing techniques.        Long  Term Goals Maintenance of O2 saturations>88%;Verbalizes importance of monitoring SPO2 with pulse oximeter and return  demonstration;Exhibits proper breathing techniques, such as pursed lip breathing or other method taught during program session       Comments Reviewed PLB technique with pt.  Talked about how it works and it's importance in maintaining their exercise saturations.  Goals/Expected Outcomes Short: Become more profiecient at using PLB.   Long: Become independent at using PLB.          Oxygen Discharge (Final Oxygen Re-Evaluation):  Oxygen Re-Evaluation - 08/13/24 1349       Goals/Expected Outcomes   Short Term Goals To learn and understand importance of maintaining oxygen saturations>88%;To learn and understand importance of monitoring SPO2 with pulse oximeter and demonstrate accurate use of the pulse oximeter.;To learn and demonstrate proper pursed lip breathing techniques or other breathing techniques.     Long  Term Goals Maintenance of O2 saturations>88%;Verbalizes importance of monitoring SPO2 with pulse oximeter and return demonstration;Exhibits proper breathing techniques, such as pursed lip breathing or other method taught during program session    Comments Reviewed PLB technique with pt.  Talked about how it works and it's importance in maintaining their exercise saturations.    Goals/Expected Outcomes Short: Become more profiecient at using PLB.   Long: Become independent at using PLB.          Initial Exercise Prescription:  Initial Exercise Prescription - 08/10/24 1500       Date of Initial Exercise RX and Referring Provider   Date 08/10/24    Referring Provider Abram Clap MD      Oxygen   Maintain Oxygen Saturation 88% or higher      Treadmill   MPH 1.7    Grade 0.5    Minutes 15    METs 2.42      REL-XR   Level 1    Speed 50    Minutes 15    METs 2.5      Prescription Details   Frequency (times per week) 2    Duration Progress to 30 minutes of continuous aerobic without signs/symptoms of physical distress      Intensity   THRR 40-80% of Max Heartrate  108-139    Ratings of Perceived Exertion 11-13    Perceived Dyspnea 0-4      Progression   Progression Continue to progress workloads to maintain intensity without signs/symptoms of physical distress.      Resistance Training   Training Prescription Yes    Weight 4 lb    Reps 10-15          Perform Capillary Blood Glucose checks as needed.  Exercise Prescription Changes:   Exercise Prescription Changes     Row Name 08/10/24 1500 08/13/24 1500           Response to Exercise   Blood Pressure (Admit) 122/64 116/62      Blood Pressure (Exercise) 144/62 126/54      Blood Pressure (Exit) 132/70 126/62      Heart Rate (Admit) 77 bpm 86 bpm      Heart Rate (Exercise) 96 bpm 109 bpm      Heart Rate (Exit) 68 bpm 84 bpm      Oxygen Saturation (Admit) 96 % 94 %      Oxygen Saturation (Exercise) 91 % 94 %      Oxygen Saturation (Exit) 100 % 96 %      Rating of Perceived Exertion (Exercise) 12 13      Perceived Dyspnea (Exercise) 2 2      Symptoms chroinc hip pain 3/10 --      Comments wakl test results --      Duration -- Continue with 30 min of aerobic exercise without signs/symptoms of physical distress.      Intensity -- THRR unchanged  Progression   Progression -- Continue to progress workloads to maintain intensity without signs/symptoms of physical distress.        Resistance Training   Training Prescription -- Yes      Weight -- 4      Reps -- 10-15        Treadmill   MPH -- 1.7      Grade -- 0.5      Minutes -- 15      METs -- 2.42        REL-XR   Level -- 1      Speed -- 58      Minutes -- 15      METs -- 3.8         Exercise Comments:   Exercise Comments     Row Name 08/13/24 1349           Exercise Comments First full day of exercise!  Patient was oriented to gym and equipment including functions, settings, policies, and procedures.  Patient's individual exercise prescription and treatment plan were reviewed.  All starting workloads were  established based on the results of the 6 minute walk test done at initial orientation visit.  The plan for exercise progression was also introduced and progression will be customized based on patient's performance and goals.          Exercise Goals and Review:   Exercise Goals     Row Name 08/10/24 1517             Exercise Goals   Increase Physical Activity Yes       Intervention Provide advice, education, support and counseling about physical activity/exercise needs.;Develop an individualized exercise prescription for aerobic and resistive training based on initial evaluation findings, risk stratification, comorbidities and participant's personal goals.       Expected Outcomes Short Term: Attend rehab on a regular basis to increase amount of physical activity.;Long Term: Add in home exercise to make exercise part of routine and to increase amount of physical activity.;Long Term: Exercising regularly at least 3-5 days a week.       Increase Strength and Stamina Yes       Intervention Provide advice, education, support and counseling about physical activity/exercise needs.;Develop an individualized exercise prescription for aerobic and resistive training based on initial evaluation findings, risk stratification, comorbidities and participant's personal goals.       Expected Outcomes Short Term: Increase workloads from initial exercise prescription for resistance, speed, and METs.;Short Term: Perform resistance training exercises routinely during rehab and add in resistance training at home;Long Term: Improve cardiorespiratory fitness, muscular endurance and strength as measured by increased METs and functional capacity ( )       Able to understand and use rate of perceived exertion (RPE) scale Yes       Intervention Provide education and explanation on how to use RPE scale       Expected Outcomes Short Term: Able to use RPE daily in rehab to express subjective intensity level;Long Term:   Able to use RPE to guide intensity level when exercising independently       Able to understand and use Dyspnea scale Yes       Intervention Provide education and explanation on how to use Dyspnea scale       Expected Outcomes Short Term: Able to use Dyspnea scale daily in rehab to express subjective sense of shortness of breath during exertion;Long Term: Able to use Dyspnea scale to guide intensity  level when exercising independently       Knowledge and understanding of Target Heart Rate Range (THRR) Yes       Intervention Provide education and explanation of THRR including how the numbers were predicted and where they are located for reference       Expected Outcomes Short Term: Able to state/look up THRR;Long Term: Able to use THRR to govern intensity when exercising independently;Short Term: Able to use daily as guideline for intensity in rehab       Able to check pulse independently Yes       Intervention Provide education and demonstration on how to check pulse in carotid and radial arteries.;Review the importance of being able to check your own pulse for safety during independent exercise       Expected Outcomes Long Term: Able to check pulse independently and accurately;Short Term: Able to explain why pulse checking is important during independent exercise       Understanding of Exercise Prescription Yes       Intervention Provide education, explanation, and written materials on patient's individual exercise prescription       Expected Outcomes Short Term: Able to explain program exercise prescription;Long Term: Able to explain home exercise prescription to exercise independently          Exercise Goals Re-Evaluation :  Exercise Goals Re-Evaluation     Row Name 08/13/24 1349 08/17/24 0808           Exercise Goal Re-Evaluation   Exercise Goals Review Understanding of Exercise Prescription;Knowledge and understanding of Target Heart Rate Range (THRR);Able to understand and use rate of  perceived exertion (RPE) scale;Able to understand and use Dyspnea scale Increase Strength and Stamina;Increase Physical Activity;Understanding of Exercise Prescription      Comments Reviewed RPE and dyspnea scale, THR and program prescription with pt today.  Pt voiced understanding and was given a copy of goals to take home. Brolin just started rehab. He did well on his first visit. Will continue to monitor and progress as able      Expected Outcomes Short: Use RPE daily to regulate intensity.  Long: Follow program prescription in THR. Short: continue to attend rehab    long: add in exercise at home         Discharge Exercise Prescription (Final Exercise Prescription Changes):  Exercise Prescription Changes - 08/13/24 1500       Response to Exercise   Blood Pressure (Admit) 116/62    Blood Pressure (Exercise) 126/54    Blood Pressure (Exit) 126/62    Heart Rate (Admit) 86 bpm    Heart Rate (Exercise) 109 bpm    Heart Rate (Exit) 84 bpm    Oxygen Saturation (Admit) 94 %    Oxygen Saturation (Exercise) 94 %    Oxygen Saturation (Exit) 96 %    Rating of Perceived Exertion (Exercise) 13    Perceived Dyspnea (Exercise) 2    Duration Continue with 30 min of aerobic exercise without signs/symptoms of physical distress.    Intensity THRR unchanged      Progression   Progression Continue to progress workloads to maintain intensity without signs/symptoms of physical distress.      Resistance Training   Training Prescription Yes    Weight 4    Reps 10-15      Treadmill   MPH 1.7    Grade 0.5    Minutes 15    METs 2.42      REL-XR   Level 1  Speed 58    Minutes 15    METs 3.8          Nutrition:  Target Goals: Understanding of nutrition guidelines, daily intake of sodium 1500mg , cholesterol 200mg , calories 30% from fat and 7% or less from saturated fats, daily to have 5 or more servings of fruits and vegetables.  Biometrics:  Pre Biometrics - 08/10/24 1517       Pre  Biometrics   Height 5' 7 (1.702 m)    Weight 169 lb 11.2 oz (77 kg)    Waist Circumference 33 inches    Hip Circumference 36 inches    Waist to Hip Ratio 0.92 %    BMI (Calculated) 26.57    Grip Strength 33.2 kg    Single Leg Stand 0 seconds           Nutrition Therapy Plan and Nutrition Goals:  Nutrition Therapy & Goals - 08/10/24 1518       Intervention Plan   Intervention Nutrition handout(s) given to patient.;Prescribe, educate and counsel regarding individualized specific dietary modifications aiming towards targeted core components such as weight, hypertension, lipid management, diabetes, heart failure and other comorbidities.    Expected Outcomes Short Term Goal: Understand basic principles of dietary content, such as calories, fat, sodium, cholesterol and nutrients.;Long Term Goal: Adherence to prescribed nutrition plan.          Nutrition Assessments:  MEDIFICTS Score Key: >=70 Need to make dietary changes  40-70 Heart Healthy Diet <= 40 Therapeutic Level Cholesterol Diet  Flowsheet Row PULMONARY REHAB COPD ORIENTATION from 08/10/2024 in [facility] CARDIAC REHABILITATION  Picture Your Plate Total Score on Admission 63   Picture Your Plate Scores: <59 Unhealthy dietary pattern with much room for improvement. 41-50 Dietary pattern unlikely to meet recommendations for good health and room for improvement. 51-60 More healthful dietary pattern, with some room for improvement.  >60 Healthy dietary pattern, although there may be some specific behaviors that could be improved.    Nutrition Goals Re-Evaluation:   Nutrition Goals Discharge (Final Nutrition Goals Re-Evaluation):   Psychosocial: Target Goals: Acknowledge presence or absence of significant depression and/or stress, maximize coping skills, provide positive support system. Participant is able to verbalize types and ability to use techniques and skills needed for reducing stress and depression.  Initial  Review & Psychosocial Screening:  Initial Psych Review & Screening - 08/05/24 1117       Initial Review   Current issues with Current Sleep Concerns      Family Dynamics   Good Support System? Yes    Comments His brothers are his main support systems, and he also gets together with his half sister.      Barriers   Psychosocial barriers to participate in program There are no identifiable barriers or psychosocial needs.      Screening Interventions   Interventions Encouraged to exercise;Program counselor consult;Provide feedback about the scores to participant    Expected Outcomes Short Term goal: Utilizing psychosocial counselor, staff and physician to assist with identification of specific Stressors or current issues interfering with healing process. Setting desired goal for each stressor or current issue identified.;Long Term Goal: Stressors or current issues are controlled or eliminated.;Short Term goal: Identification and review with participant of any Quality of Life or Depression concerns found by scoring the questionnaire.;Long Term goal: The participant improves quality of Life and PHQ9 Scores as seen by post scores and/or verbalization of changes  Quality of Life Scores:  Scores of 19 and below usually indicate a poorer quality of life in these areas.  A difference of  2-3 points is a clinically meaningful difference.  A difference of 2-3 points in the total score of the Quality of Life Index has been associated with significant improvement in overall quality of life, self-image, physical symptoms, and general health in studies assessing change in quality of life.   PHQ-9: Review Flowsheet       08/10/2024 02/27/2021 10/17/2020  Depression screen PHQ 2/9  Decreased Interest 0 2 1  Down, Depressed, Hopeless 0 0 0  PHQ - 2 Score 0 2 1  Altered sleeping 1 3 2   Tired, decreased energy 1 2 1   Change in appetite 0 2 0  Feeling bad or failure about yourself  0 0 0   Trouble concentrating 0 0 0  Moving slowly or fidgety/restless 0 1 0  Suicidal thoughts 0 0 0  PHQ-9 Score 2 10  4    Difficult doing work/chores Not difficult at all Somewhat difficult Not difficult at all    Details       Data saved with a previous flowsheet row definition        Interpretation of Total Score  Total Score Depression Severity:  1-4 = Minimal depression, 5-9 = Mild depression, 10-14 = Moderate depression, 15-19 = Moderately severe depression, 20-27 = Severe depression   Psychosocial Evaluation and Intervention:  Psychosocial Evaluation - 08/05/24 1127       Psychosocial Evaluation & Interventions   Interventions Encouraged to exercise with the program and follow exercise prescription    Comments Pt was referred to PR by Dr. Abram at the [facility] for COPD.  The pt has been through our PR program before, and his goals for the program this time are to breathe better and be more mobile.  The pt denied any issues with anxiety, depression, or stress.  He does still wake up at night a few times, but he has a prescription for Hydroxyzine as needed for sleep.  He lives alone and has had several falls over the last year, and when he fell in July of last year he fell off the porch and injured his right eye.  He has arthritis in his neck and back, and he has started using a cane for stability.  His main support system are his brothers and his half sister.  He looks forward to starting the program, and we will monitor his progress.    Expected Outcomes Pt will continue to have no identifiable psychosocial barriers.    Continue Psychosocial Services  Follow up required by staff          Psychosocial Re-Evaluation:   Psychosocial Discharge (Final Psychosocial Re-Evaluation):    Education: Education Goals: Education classes will be provided on a weekly basis, covering required topics. Participant will state understanding/return demonstration of topics presented.  Learning  Barriers/Preferences:  Learning Barriers/Preferences - 08/05/24 1110       Learning Barriers/Preferences   Learning Barriers Sight;Exercise Concerns    Learning Preferences Written Material          Education Topics: How Lungs Work and Diseases: - Discuss the anatomy of the lungs and diseases that can affect the lungs, such as COPD. Flowsheet Row PULMONARY REHAB CHRONIC OBSTRUCTIVE PULMONARY DISEASE from 02/02/2021 in [facility]  Date 12/29/20  Educator DJ  Instruction Review Code 1- Verbalizes Understanding    Exercise: -Discuss the importance of  exercise, FITT principles of exercise, normal and abnormal responses to exercise, and how to exercise safely.   Environmental Irritants: -Discuss types of environmental irritants and how to limit exposure to environmental irritants. Flowsheet Row PULMONARY REHAB CHRONIC OBSTRUCTIVE PULMONARY DISEASE from 02/02/2021 in [place] [facility]  Date 01/05/21  Educator [facility]  Instruction Review Code 1- Verbalizes Understanding    Meds/Inhalers and oxygen: - Discuss respiratory medications, definition of an inhaler and oxygen, and the proper way to use an inhaler and oxygen. Flowsheet Row PULMONARY REHAB CHRONIC OBSTRUCTIVE PULMONARY DISEASE from 02/02/2021 in [place] [facility]  Date 01/12/21  Educator [facility]    Energy Saving Techniques: - Discuss methods to conserve energy and decrease shortness of breath when performing activities of daily living.  Flowsheet Row PULMONARY REHAB CHRONIC OBSTRUCTIVE PULMONARY DISEASE from 02/02/2021 in [place] [facility]  Date 10/20/20  Educator Hand Out  Instruction Review Code 1- Verbalizes Understanding    Bronchial Hygiene / Breathing Techniques: - Discuss breathing mechanics, pursed-lip breathing technique,  proper posture, effective ways to clear airways, and other functional breathing techniques Flowsheet Row PULMONARY REHAB CHRONIC  OBSTRUCTIVE PULMONARY DISEASE from 02/02/2021 in [place] [facility]  Date 10/27/20  Educator pb  Instruction Review Code 1- Verbalizes Understanding    Cleaning Equipment: - Provides group verbal and written instruction about the health risks of elevated stress, cause of high stress, and healthy ways to reduce stress. Flowsheet Row PULMONARY REHAB CHRONIC OBSTRUCTIVE PULMONARY DISEASE from 02/02/2021 in [place] [facility]  Date 11/03/20  Educator Handout  Instruction Review Code 1- Verbalizes Understanding    Nutrition I: Fats: - Discuss the types of cholesterol, what cholesterol does to the body, and how cholesterol levels can be controlled. Flowsheet Row PULMONARY REHAB CHRONIC OBSTRUCTIVE PULMONARY DISEASE from 02/02/2021 in [place] [facility]  Date 11/10/20  Educator handout  Instruction Review Code 1- Verbalizes Understanding    Nutrition II: Labels: -Discuss the different components of food labels and how to read food labels. Flowsheet Row PULMONARY REHAB CHRONIC OBSTRUCTIVE PULMONARY DISEASE from 02/02/2021 in [place] [facility]  Date 11/17/20  Educator DF  Instruction Review Code 2- Demonstrated Understanding    Respiratory Infections: - Discuss the signs and symptoms of respiratory infections, ways to prevent respiratory infections, and the importance of seeking medical treatment when having a respiratory infection. Flowsheet Row PULMONARY REHAB CHRONIC OBSTRUCTIVE PULMONARY DISEASE from 02/02/2021 in [place] [facility]  Date 11/24/20  Educator DF  Instruction Review Code 2- Demonstrated Understanding    Stress I: Signs and Symptoms: - Discuss the causes of stress, how stress may lead to anxiety and depression, and ways to limit stress. Flowsheet Row PULMONARY REHAB CHRONIC OBSTRUCTIVE PULMONARY DISEASE from 02/02/2021 in [place] [facility]  Date 12/01/20  Educator DF  Instruction  Review Code 1- Verbalizes Understanding    Stress II: Relaxation: -Discuss relaxation techniques to limit stress.   Oxygen for Home/Travel: - Discuss how to prepare for travel when on oxygen and proper ways to transport and store oxygen to ensure safety. Flowsheet Row PULMONARY REHAB CHRONIC OBSTRUCTIVE PULMONARY DISEASE from 02/02/2021 in [facility]  Date 12/15/20  Educator DJ  Instruction Review Code 1- Verbalizes Understanding    Knowledge Questionnaire Score:  Knowledge Questionnaire Score - 08/10/24 1402       Knowledge Questionnaire Score   Pre Score 14/18          Core Components/Risk Factors/Patient Goals at Admission:  Personal  Goals and Risk Factors at Admission - 08/10/24 1518       Core Components/Risk Factors/Patient Goals on Admission    Weight Management Yes;Weight Loss;Weight Maintenance    Intervention Weight Management: Provide education and appropriate resources to help participant work on and attain dietary goals.;Weight Management: Develop a combined nutrition and exercise program designed to reach desired caloric intake, while maintaining appropriate intake of nutrient and fiber, sodium and fats, and appropriate energy expenditure required for the weight goal.    Admit Weight 169 lb 11.2 oz (77 kg)    Goal Weight: Short Term 167 lb (75.8 kg)    Goal Weight: Long Term 167 lb (75.8 kg)    Expected Outcomes Weight Maintenance: Understanding of the daily nutrition guidelines, which includes 25-35% calories from fat, 7% or less cal from saturated fats, less than 200mg  cholesterol, less than 1.5gm of sodium, & 5 or more servings of fruits and vegetables daily;Short Term: Continue to assess and modify interventions until short term weight is achieved;Long Term: Adherence to nutrition and physical activity/exercise program aimed toward attainment of established weight goal;Weight Loss: Understanding of general recommendations for a balanced  deficit meal plan, which promotes 1-2 lb weight loss per week and includes a negative energy balance of [phone] kcal/d;Understanding recommendations for meals to include 15-35% energy as protein, 25-35% energy from fat, 35-60% energy from carbohydrates, less than 200mg  of dietary cholesterol, 20-35 gm of total fiber daily;Understanding of distribution of calorie intake throughout the day with the consumption of 4-5 meals/snacks    Tobacco Cessation Yes    Number of packs per day 1/2-3/4 packs    Intervention Assist the participant in steps to quit. Provide individualized education and counseling about committing to Tobacco Cessation, relapse prevention, and pharmacological support that can be provided by physician.;Education officer, environmental, assist with locating and accessing local/national Quit Smoking programs, and support quit date choice.    Expected Outcomes Short Term: Will demonstrate readiness to quit, by selecting a quit date.;Short Term: Will quit all tobacco product use, adhering to prevention of relapse plan.;Long Term: Complete abstinence from all tobacco products for at least 12 months from quit date.    Improve shortness of breath with ADL's Yes    Intervention Provide education, individualized exercise plan and daily activity instruction to help decrease symptoms of SOB with activities of daily living.    Expected Outcomes Short Term: Improve cardiorespiratory fitness to achieve a reduction of symptoms when performing ADLs;Long Term: Be able to perform more ADLs without symptoms or delay the onset of symptoms    Increase knowledge of respiratory medications and ability to use respiratory devices properly  Yes    Intervention Provide education and demonstration as needed of appropriate use of medications, inhalers, and oxygen therapy.    Expected Outcomes Short Term: Achieves understanding of medications use. Understands that oxygen is a medication prescribed by physician. Demonstrates  appropriate use of inhaler and oxygen therapy.;Long Term: Maintain appropriate use of medications, inhalers, and oxygen therapy.    Diabetes Yes    Intervention Provide education about signs/symptoms and action to take for hypo/hyperglycemia.;Provide education about proper nutrition, including hydration, and aerobic/resistive exercise prescription along with prescribed medications to achieve blood glucose in normal ranges: Fasting glucose 65-99 mg/dL    Expected Outcomes Short Term: Participant verbalizes understanding of the signs/symptoms and immediate care of hyper/hypoglycemia, proper foot care and importance of medication, aerobic/resistive exercise and nutrition plan for blood glucose control.;Long Term: Attainment of HbA1C < 7%.  Hypertension Yes    Intervention Provide education on lifestyle modifcations including regular physical activity/exercise, weight management, moderate sodium restriction and increased consumption of fresh fruit, vegetables, and low fat dairy, alcohol moderation, and smoking cessation.;Monitor prescription use compliance.    Expected Outcomes Short Term: Continued assessment and intervention until BP is < 140/45mm HG in hypertensive participants. < 130/12mm HG in hypertensive participants with diabetes, heart failure or chronic kidney disease.;Long Term: Maintenance of blood pressure at goal levels.    Lipids Yes    Intervention Provide education and support for participant on nutrition & aerobic/resistive exercise along with prescribed medications to achieve LDL 70mg , HDL >40mg .    Expected Outcomes Short Term: Participant states understanding of desired cholesterol values and is compliant with medications prescribed. Participant is following exercise prescription and nutrition guidelines.;Long Term: Cholesterol controlled with medications as prescribed, with individualized exercise RX and with personalized nutrition plan. Value goals: LDL < 70mg , HDL > 40 mg.           Core Components/Risk Factors/Patient Goals Review:    Core Components/Risk Factors/Patient Goals at Discharge (Final Review):    ITP Comments:  ITP Comments     Row Name 08/10/24 1419 08/13/24 1349 08/25/24 1113       ITP Comments Patient arrived for 1st visit/orientation/education at 1300. Patient was referred to PR by Dr. Caron Reil from the [facility] due to COPD. During orientation advised patient on arrival and appointment times what to wear, what to do before, during and after exercise. Reviewed attendance and class policy.  Pt is scheduled to return Pulmonary Rehab on 08/13/24 at 1330. Pt was advised to come to class 15 minutes before class starts.  Discussed RPE/Dpysnea scales. Patient participated in warm up stretches. Patient was able to complete 6 minute walk test.  Patient was measured for the equipment. Discussed equipment safety with patient. Took patient pre-anthropometric measurements. Patient finished visit at 1410. First full day of exercise!  Patient was oriented to gym and equipment including functions, settings, policies, and procedures.  Patient's individual exercise prescription and treatment plan were reviewed.  All starting workloads were established based on the results of the 6 minute walk test done at initial orientation visit.  The plan for exercise progression was also introduced and progression will be customized based on patient's performance and goals. 30 day review completed. ITP sent to Dr.Jehanzeb Memon, Medical Director of  Pulmonary Rehab. Continue with ITP unless changes are made by physician.  Sill newer to program        Comments: 30 day review

## 2024-08-27 ENCOUNTER — Ambulatory Visit (HOSPITAL_COMMUNITY)

## 2024-09-01 ENCOUNTER — Encounter (HOSPITAL_COMMUNITY)
Admission: RE | Admit: 2024-09-01 | Discharge: 2024-09-01 | Disposition: A | Discharge status: Home / Self Care | Source: Ambulatory Visit | Attending: Internal Medicine | Admitting: Internal Medicine

## 2024-09-01 DIAGNOSIS — J449 Chronic obstructive pulmonary disease, unspecified: Secondary | ICD-10-CM | POA: Diagnosis present

## 2024-09-01 LAB — GLUCOSE, CAPILLARY: Glucose-Capillary: 150 mg/dL — ABNORMAL HIGH (ref 70–99)

## 2024-09-01 NOTE — Progress Notes (Signed)
 Daily Session Note  Patient Details  Name: BINGHAM MILLETTE MRN: [id number] Date of Birth: 1956-06-06 Referring Provider:   Flowsheet Row PULMONARY REHAB COPD ORIENTATION from 08/10/2024 in [facility]  Referring Provider Abram Clap MD    Encounter Date: 09/01/2024  Check In:  Session Check In - 09/01/24 1336       Check-In   Supervising physician immediately available to respond to emergencies See telemetry face sheet for immediately available MD    Location [facility]    Staff Present Powell Benders, BS, Exercise Physiologist;Jaymarion Trombly Vonzell, MA, RCEP, CCRP, CCET;Brittany Jackquline, BSN, RN, WTA-C    Virtual Visit No    Medication changes reported     No    Fall or balance concerns reported    No    Warm-up and Cool-down Performed on first and last piece of equipment    Resistance Training Performed Yes    VAD Patient? No    PAD/SET Patient? No      Pain Assessment   Currently in Pain? No/denies          Capillary Blood Glucose: Results for orders placed or performed during the hospital encounter of 09/01/24 (from the past 24 hours)  Glucose, capillary     Status: Abnormal   Collection Time: 09/01/24  1:31 PM  Result Value Ref Range   Glucose-Capillary 150 (H) 70 - 99 mg/dL      Social History   Tobacco Use  Smoking Status Every Day   Current packs/day: 0.25   Average packs/day: 0.3 packs/day for 46.0 years (11.5 ttl pk-yrs)   Types: Cigarettes  Smokeless Tobacco Former   Types: Snuff  Tobacco Comments   using a nicotine  patch, trying to quit     Goals Met:  Proper associated with RPD/PD & O2 Sat Independence with exercise equipment Using PLB without cueing & demonstrates good technique Exercise tolerated well No report of concerns or symptoms today Strength training completed today  Goals Unmet:  Not Applicable  Comments: Pt able to follow exercise prescription today without complaint.  Will continue to  monitor for progression.

## 2024-09-03 ENCOUNTER — Encounter (HOSPITAL_COMMUNITY)
Admission: RE | Admit: 2024-09-03 | Discharge: 2024-09-03 | Disposition: A | Discharge status: Home / Self Care | Source: Ambulatory Visit | Attending: Internal Medicine | Admitting: Internal Medicine

## 2024-09-03 DIAGNOSIS — J449 Chronic obstructive pulmonary disease, unspecified: Secondary | ICD-10-CM | POA: Diagnosis not present

## 2024-09-03 NOTE — Progress Notes (Signed)
 Daily Session Note  Patient Details  Name: Glen Cobb MRN: [id number] Date of Birth: 1956/02/25 Referring Provider:   Flowsheet Row PULMONARY REHAB COPD ORIENTATION from 08/10/2024 in [facility]  Referring Provider Abram Clap MD    Encounter Date: 09/03/2024  Check In:  Session Check In - 09/03/24 1400       Check-In   Supervising physician immediately available to respond to emergencies See telemetry face sheet for immediately available MD    Location [facility]    Staff Present Powell Benders, BS, Exercise Physiologist;Debra Vicci, RN, BSN;Mildreth Reek BSN, RN    Virtual Visit No    Medication changes reported     No    Fall or balance concerns reported    No    Tobacco Cessation No Change    Warm-up and Cool-down Performed on first and last piece of equipment    Resistance Training Performed Yes    VAD Patient? No    PAD/SET Patient? No      Pain Assessment   Currently in Pain? No/denies    Multiple Pain Sites No          Capillary Blood Glucose: No results found for this or any previous visit (from the past 24 hours).    Tobacco Use History[1]  Goals Met:  Proper associated with RPD/PD & O2 Sat Independence with exercise equipment Using PLB without cueing & demonstrates good technique Exercise tolerated well Queuing for purse lip breathing No report of concerns or symptoms today Strength training completed today  Goals Unmet:  Not Applicable  Comments: SABRASABRAPt able to follow exercise prescription today without complaint.  Will continue to monitor for progression.        [1]  Social History Tobacco Use  Smoking Status Every Day   Current packs/day: 0.25   Average packs/day: 0.3 packs/day for 46.0 years (11.5 ttl pk-yrs)   Types: Cigarettes  Smokeless Tobacco Former   Types: Snuff  Tobacco Comments   using a nicotine  patch, trying to quit

## 2024-09-08 ENCOUNTER — Encounter (HOSPITAL_COMMUNITY)

## 2024-09-10 ENCOUNTER — Encounter (HOSPITAL_COMMUNITY): Admission: RE | Admit: 2024-09-10

## 2024-09-10 DIAGNOSIS — J449 Chronic obstructive pulmonary disease, unspecified: Secondary | ICD-10-CM | POA: Diagnosis not present

## 2024-09-10 NOTE — Progress Notes (Signed)
 Daily Session Note  Patient Details  Name: Glen Cobb MRN: [id number] Date of Birth: Jun 19, 1956 Referring Provider:   Flowsheet Row PULMONARY REHAB COPD ORIENTATION from 08/10/2024 in [facility]  Referring Provider Abram Clap MD    Encounter Date: 09/10/2024  Check In:  Session Check In - 09/10/24 1338       Check-In   Supervising physician immediately available to respond to emergencies See telemetry face sheet for immediately available MD    Location [facility]    Staff Present Powell Benders, BS, Exercise Physiologist;Debra Vicci, RN, BSN;Jennamarie Goings BSN, RN    Virtual Visit No    Medication changes reported     No    Fall or balance concerns reported    No    Tobacco Cessation No Change    Warm-up and Cool-down Performed on first and last piece of equipment    Resistance Training Performed Yes    VAD Patient? No    PAD/SET Patient? No      Pain Assessment   Currently in Pain? No/denies    Multiple Pain Sites No          Capillary Blood Glucose: No results found for this or any previous visit (from the past 24 hours).    Tobacco Use History[1]  Goals Met:  Proper associated with RPD/PD & O2 Sat Independence with exercise equipment Using PLB without cueing & demonstrates good technique Exercise tolerated well Queuing for purse lip breathing No report of concerns or symptoms today Strength training completed today  Goals Unmet:  Not Applicable  Comments: SABRASABRAPt able to follow exercise prescription today without complaint.  Will continue to monitor for progression.        [1]  Social History Tobacco Use  Smoking Status Every Day   Current packs/day: 0.25   Average packs/day: 0.3 packs/day for 46.0 years (11.5 ttl pk-yrs)   Types: Cigarettes  Smokeless Tobacco Former   Types: Snuff  Tobacco Comments   using a nicotine  patch, trying to quit

## 2024-09-15 ENCOUNTER — Encounter (HOSPITAL_COMMUNITY)
Admission: RE | Admit: 2024-09-15 | Discharge: 2024-09-15 | Disposition: A | Discharge status: Home / Self Care | Source: Ambulatory Visit | Attending: Internal Medicine | Admitting: Internal Medicine

## 2024-09-15 DIAGNOSIS — J449 Chronic obstructive pulmonary disease, unspecified: Secondary | ICD-10-CM

## 2024-09-15 NOTE — Progress Notes (Signed)
 Daily Session Note  Patient Details  Name: Glen Cobb MRN: [id number] Date of Birth: 23-Jul-1956 Referring Provider:   Flowsheet Row PULMONARY REHAB COPD ORIENTATION from 08/10/2024 in [place] [facility]  Referring Provider Abram Clap MD    Encounter Date: 09/15/2024  Check In:  Session Check In - 09/15/24 1338       Check-In   Supervising physician immediately available to respond to emergencies See telemetry face sheet for immediately available MD    Location [facility]    Staff Present Powell Benders, BS, Exercise Physiologist;Jessica Vonzell, MA, RCEP, CCRP, CCET;Euell Schiff [place], RN    Virtual Visit No    Medication changes reported     No    Fall or balance concerns reported    No    Warm-up and Cool-down Performed on first and last piece of equipment    Resistance Training Performed Yes    VAD Patient? No    PAD/SET Patient? No      Pain Assessment   Currently in Pain? No/denies          Capillary Blood Glucose: No results found for this or any previous visit (from the past 24 hours).    Tobacco Use History[1]  Goals Met:  Proper associated with RPD/PD & O2 Sat Independence with exercise equipment Using PLB without cueing & demonstrates good technique Exercise tolerated well No report of concerns or symptoms today Strength training completed today  Goals Unmet:  Not Applicable  Comments: Pt able to follow exercise prescription today without complaint.  Will continue to monitor for progression.        [1]  Social History Tobacco Use  Smoking Status Every Day   Current packs/day: 0.25   Average packs/day: 0.3 packs/day for 46.0 years (11.5 ttl pk-yrs)   Types: Cigarettes  Smokeless Tobacco Former   Types: Snuff  Tobacco Comments   using a nicotine  patch, trying to quit

## 2024-09-22 ENCOUNTER — Encounter (HOSPITAL_COMMUNITY)
Admission: RE | Admit: 2024-09-22 | Discharge: 2024-09-22 | Disposition: A | Discharge status: Home / Self Care | Source: Ambulatory Visit | Attending: Internal Medicine | Admitting: Internal Medicine

## 2024-09-22 ENCOUNTER — Encounter (HOSPITAL_COMMUNITY): Payer: Self-pay | Admitting: *Deleted

## 2024-09-22 DIAGNOSIS — J449 Chronic obstructive pulmonary disease, unspecified: Secondary | ICD-10-CM

## 2024-09-22 NOTE — Progress Notes (Signed)
 Daily Session Note  Patient Details  Name: Glen Cobb MRN: [id number] Date of Birth: May 26, 1956 Referring Provider:   Flowsheet Row PULMONARY REHAB COPD ORIENTATION from 08/10/2024 in [facility]  Referring Provider Abram Clap MD    Encounter Date: 09/22/2024  Check In:  Session Check In - 09/22/24 1339       Check-In   Supervising physician immediately available to respond to emergencies See telemetry face sheet for immediately available MD    Location [facility]    Staff Present Laymon Rattler, BSN, RN, WTA-C;Tina Chaney, RN;Moises Terpstra Zina, RN    Virtual Visit No    Medication changes reported     No    Fall or balance concerns reported    No    Warm-up and Cool-down Performed on first and last piece of equipment    Resistance Training Performed Yes    VAD Patient? No    PAD/SET Patient? No      Pain Assessment   Currently in Pain? No/denies          Capillary Blood Glucose: No results found for this or any previous visit (from the past 24 hours).    Tobacco Use History[1]  Goals Met:  Proper associated with RPD/PD & O2 Sat Independence with exercise equipment Using PLB without cueing & demonstrates good technique Exercise tolerated well No report of concerns or symptoms today Strength training completed today  Goals Unmet:  Not Applicable  Comments: Pt able to follow exercise prescription today without complaint.  Will continue to monitor for progression.        [1]  Social History Tobacco Use  Smoking Status Every Day   Current packs/day: 0.25   Average packs/day: 0.3 packs/day for 46.0 years (11.5 ttl pk-yrs)   Types: Cigarettes  Smokeless Tobacco Former   Types: Snuff  Tobacco Comments   using a nicotine  patch, trying to quit

## 2024-09-22 NOTE — Progress Notes (Signed)
 Pulmonary Individual Treatment Plan  Patient Details  Name: Glen Cobb MRN: [id number] Date of Birth: [birth year] Referring Provider:   Flowsheet Row PULMONARY REHAB COPD ORIENTATION from 08/10/2024 in [facility] CARDIAC REHABILITATION  Referring Provider Abram Clap MD    Initial Encounter Date:  Flowsheet Row PULMONARY REHAB COPD ORIENTATION from 08/10/2024 in [place] CARDIAC REHABILITATION  Date 08/10/24    Visit Diagnosis: Chronic obstructive pulmonary disease, unspecified COPD type (HCC)  Patient's Home Medications on Admission:  Current Medications[1]  Past Medical History: Past Medical History:  Diagnosis Date   Arthritis    Cancer (HCC)    bladder cancer x 2   Cataract    MD just watching   COPD (chronic obstructive pulmonary disease) (HCC)    Emphysema of lung (HCC)    FH: migraines    last one 1 yr ago - OTC med prn   Gout    HSV infection    Hyperlipidemia    no med. diet   Hypertension    Kidney stone    passed stone - no surgery   Osteoporosis    Sleep apnea    Does not use CPAP nightly.    Tobacco Use: Tobacco Use History[2]  Labs: Review Flowsheet        No data to display          Capillary Blood Glucose: Lab Results  Component Value Date   GLUCAP 150 (H) 09/01/2024   GLUCAP 101 (H) 08/18/2024   GLUCAP 109 (H) 08/18/2024   GLUCAP 100 (H) 08/13/2024   GLUCAP 108 (H) 08/13/2024     Pulmonary Assessment Scores:  Pulmonary Assessment Scores     Row Name 08/10/24 1400         ADL UCSD   ADL Phase Entry     SOB Score total 24     Rest 1     Walk 2     Stairs 4     Bath 1     Dress 0     Shop 0       CAT Score   CAT Score 20       mMRC Score   mMRC Score 2       UCSD: Self-administered rating of dyspnea associated with activities of daily living (ADLs) 6-point scale (0 = not at all to 5 = maximal or unable to do because of breathlessness)  Scoring Scores range from 0 to 120.  Minimally important  difference is 5 units  CAT: CAT can identify the health impairment of COPD patients and is better correlated with disease progression.  CAT has a scoring range of zero to 40. The CAT score is classified into four groups of low (less than 10), medium (10 - 20), high (21-30) and very high (31-40) based on the impact level of disease on health status. A CAT score over 10 suggests significant symptoms.  A worsening CAT score could be explained by an exacerbation, poor medication adherence, poor inhaler technique, or progression of COPD or comorbid conditions.  CAT MCID is 2 points  mMRC: mMRC (Modified Medical Research Council) Dyspnea Scale is used to assess the degree of baseline functional disability in patients of respiratory disease due to dyspnea. No minimal important difference is established. A decrease in score of 1 point or greater is considered a positive change.   Pulmonary Function Assessment:   Exercise Target Goals: Exercise Program Goal: Individual exercise prescription set using results from initial 6 min walk test  and THRR while considering  patients activity barriers and safety.   Exercise Prescription Goal: Initial exercise prescription builds to 30-45 minutes a day of aerobic activity, 2-3 days per week.  Home exercise guidelines will be given to patient during program as part of exercise prescription that the participant will acknowledge.  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 HR 96 bpm     Max Ex. BP 144/62     2 Minute Post BP 142/72       Interval HR   1 Minute HR 89     2 Minute HR 92     3 Minute HR 92     4 Minute HR 93     5 Minute HR 96     6 Minute HR 95     2 Minute Post HR 80     Interval Heart Rate? Yes       Interval Oxygen   Interval Oxygen? Yes     Baseline Oxygen Saturation % 96 %     1 Minute Oxygen Saturation % 92 %     1 Minute Liters of Oxygen 0 L  Room AIr     2 Minute Oxygen Saturation % 93 %     2 Minute Liters of Oxygen 0 L     3 Minute Oxygen Saturation % 93 %     3 Minute Liters of Oxygen 0 L     4 Minute Oxygen Saturation % 91 %     4 Minute Liters of Oxygen 0 L     5 Minute Oxygen Saturation % 91 %     5 Minute Liters of Oxygen 0 L     6 Minute Oxygen Saturation % 91 %     6 Minute Liters of Oxygen 0 L     2 Minute Post Oxygen Saturation % 97 %     2 Minute Post Liters of Oxygen 0 L        Oxygen Initial Assessment:  Oxygen Initial Assessment - 08/05/24 1109       Home Oxygen   Home Oxygen Device None    Sleep Oxygen Prescription CPAP   pt does not wear CPAP regularly   Home Exercise Oxygen  Prescription None    Home Resting Oxygen Prescription None      Initial 6 min Walk   Oxygen Used None      Program Oxygen Prescription   Program Oxygen Prescription None      Intervention   Short Term Goals To learn and understand importance of monitoring SPO2 with pulse oximeter and demonstrate accurate use of the pulse oximeter.;To learn and understand importance of maintaining oxygen saturations>88%;To learn and  demonstrate proper use of respiratory medications;To learn and demonstrate proper pursed lip breathing techniques or other breathing techniques.     Long  Term Goals Verbalizes importance of monitoring SPO2 with pulse oximeter and return demonstration;Maintenance of O2 saturations>88%;Exhibits proper breathing techniques, such as pursed lip breathing or other method taught during program session;Compliance with respiratory medication;Demonstrates proper use of MDIs          Oxygen Re-Evaluation:  Oxygen Re-Evaluation     Row Name 08/13/24 1349             Goals/Expected Outcomes   Short Term Goals To learn and understand importance of maintaining oxygen saturations>88%;To learn and understand importance of monitoring SPO2 with pulse oximeter and demonstrate accurate use of the pulse oximeter.;To learn and demonstrate proper pursed lip breathing techniques or other breathing techniques.        Long  Term Goals Maintenance of O2 saturations>88%;Verbalizes importance of monitoring SPO2 with pulse oximeter and return demonstration;Exhibits proper breathing techniques, such as pursed lip breathing or other method taught during program session       Comments Reviewed PLB technique with pt.  Talked about how it works and it's importance in maintaining their exercise saturations.       Goals/Expected Outcomes Short: Become more profiecient at using PLB.   Long: Become independent at using PLB.          Oxygen Discharge (Final Oxygen Re-Evaluation):  Oxygen Re-Evaluation - 08/13/24 1349       Goals/Expected Outcomes   Short Term Goals To learn and understand importance of maintaining oxygen saturations>88%;To learn and understand importance of monitoring SPO2 with pulse oximeter and demonstrate accurate use of the pulse oximeter.;To learn and demonstrate proper pursed lip breathing techniques or other breathing techniques.     Long  Term Goals Maintenance of O2 saturations>88%;Verbalizes  importance of monitoring SPO2 with pulse oximeter and return demonstration;Exhibits proper breathing techniques, such as pursed lip breathing or other method taught during program session    Comments Reviewed PLB technique with pt.  Talked about how it works and it's importance in maintaining their exercise saturations.    Goals/Expected Outcomes Short: Become more profiecient at using PLB.   Long: Become independent at using PLB.          Initial Exercise Prescription:  Initial Exercise Prescription - 08/10/24 1500       Date of Initial Exercise RX and Referring Provider   Date 08/10/24    Referring Provider Abram Clap MD      Oxygen   Maintain Oxygen Saturation 88% or higher      Treadmill   MPH 1.7    Grade 0.5    Minutes 15    METs 2.42      REL-XR   Level 1    Speed 50    Minutes 15    METs 2.5      Prescription Details   Frequency (times per week) 2    Duration Progress to 30 minutes of continuous aerobic without signs/symptoms of  physical distress      Intensity   THRR 40-80% of Max Heartrate 108-139    Ratings of Perceived Exertion 11-13    Perceived Dyspnea 0-4      Progression   Progression Continue to progress workloads to maintain intensity without signs/symptoms of physical distress.      Resistance Training   Training Prescription Yes    Weight 4 lb    Reps 10-15          Perform Capillary Blood Glucose checks as needed.  Exercise Prescription Changes:   Exercise Prescription Changes     Row Name 08/10/24 1500 08/13/24 1500           Response to Exercise   Blood Pressure (Admit) 122/64 116/62      Blood Pressure (Exercise) 144/62 126/54      Blood Pressure (Exit) 132/70 126/62      Heart Rate (Admit) 77 bpm 86 bpm      Heart Rate (Exercise) 96 bpm 109 bpm      Heart Rate (Exit) 68 bpm 84 bpm      Oxygen Saturation (Admit) 96 % 94 %      Oxygen Saturation (Exercise) 91 % 94 %      Oxygen Saturation (Exit) 100 % 96 %      Rating  of Perceived Exertion (Exercise) 12 13      Perceived Dyspnea (Exercise) 2 2      Symptoms chroinc hip pain 3/10 --      Comments wakl test results --      Duration -- Continue with 30 min of aerobic exercise without signs/symptoms of physical distress.      Intensity -- THRR unchanged        Progression   Progression -- Continue to progress workloads to maintain intensity without signs/symptoms of physical distress.        Resistance Training   Training Prescription -- Yes      Weight -- 4      Reps -- 10-15        Treadmill   MPH -- 1.7      Grade -- 0.5      Minutes -- 15      METs -- 2.42        REL-XR   Level -- 1      Speed -- 58      Minutes -- 15      METs -- 3.8         Exercise Comments:   Exercise Comments     Row Name 08/13/24 1349           Exercise Comments First full day of exercise!  Patient was oriented to gym and equipment including functions, settings, policies, and procedures.  Patient's individual exercise prescription and treatment plan were reviewed.  All starting workloads were established based on the results of the 6 minute walk test done at initial orientation visit.  The plan for exercise progression was also introduced and progression will be customized based on patient's performance and goals.          Exercise Goals and Review:   Exercise Goals     Row Name 08/10/24 1517             Exercise Goals   Increase Physical Activity Yes       Intervention Provide advice, education, support and counseling about physical activity/exercise needs.;Develop an individualized exercise prescription for aerobic and resistive training based on initial  evaluation findings, risk stratification, comorbidities and participant's personal goals.       Expected Outcomes Short Term: Attend rehab on a regular basis to increase amount of physical activity.;Long Term: Add in home exercise to make exercise part of routine and to increase amount of physical  activity.;Long Term: Exercising regularly at least 3-5 days a week.       Increase Strength and Stamina Yes       Intervention Provide advice, education, support and counseling about physical activity/exercise needs.;Develop an individualized exercise prescription for aerobic and resistive training based on initial evaluation findings, risk stratification, comorbidities and participant's personal goals.       Expected Outcomes Short Term: Increase workloads from initial exercise prescription for resistance, speed, and METs.;Short Term: Perform resistance training exercises routinely during rehab and add in resistance training at home;Long Term: Improve cardiorespiratory fitness, muscular endurance and strength as measured by increased METs and functional capacity ( )       Able to understand and use rate of perceived exertion (RPE) scale Yes       Intervention Provide education and explanation on how to use RPE scale       Expected Outcomes Short Term: Able to use RPE daily in rehab to express subjective intensity level;Long Term:  Able to use RPE to guide intensity level when exercising independently       Able to understand and use Dyspnea scale Yes       Intervention Provide education and explanation on how to use Dyspnea scale       Expected Outcomes Short Term: Able to use Dyspnea scale daily in rehab to express subjective sense of shortness of breath during exertion;Long Term: Able to use Dyspnea scale to guide intensity level when exercising independently       Knowledge and understanding of Target Heart Rate Range (THRR) Yes       Intervention Provide education and explanation of THRR including how the numbers were predicted and where they are located for reference       Expected Outcomes Short Term: Able to state/look up THRR;Long Term: Able to use THRR to govern intensity when exercising independently;Short Term: Able to use daily as guideline for intensity in rehab       Able to check  pulse independently Yes       Intervention Provide education and demonstration on how to check pulse in carotid and radial arteries.;Review the importance of being able to check your own pulse for safety during independent exercise       Expected Outcomes Long Term: Able to check pulse independently and accurately;Short Term: Able to explain why pulse checking is important during independent exercise       Understanding of Exercise Prescription Yes       Intervention Provide education, explanation, and written materials on patient's individual exercise prescription       Expected Outcomes Short Term: Able to explain program exercise prescription;Long Term: Able to explain home exercise prescription to exercise independently          Exercise Goals Re-Evaluation :  Exercise Goals Re-Evaluation     Row Name 08/13/24 1349 08/17/24 0808 09/10/24 1525         Exercise Goal Re-Evaluation   Exercise Goals Review Understanding of Exercise Prescription;Knowledge and understanding of Target Heart Rate Range (THRR);Able to understand and use rate of perceived exertion (RPE) scale;Able to understand and use Dyspnea scale Increase Strength and Stamina;Increase Physical Activity;Understanding of Exercise Prescription Increase Physical Activity;Increase  Strength and Stamina;Understanding of Exercise Prescription     Comments Reviewed RPE and dyspnea scale, THR and program prescription with pt today.  Pt voiced understanding and was given a copy of goals to take home. Glen Cobb just started rehab. He did well on his first visit. Will continue to monitor and progress as able Glen Cobb is doing well in rehab. He just started so he has not noticed an increase yet in stamin/.; HE is walking his driveway at home     Expected Outcomes Short: Use RPE daily to regulate intensity.  Long: Follow program prescription in THR. Short: continue to attend rehab    long: add in exercise at home Short: continue to attend rehab   Long:  continue to exercise at [facility]        Discharge Exercise Prescription (Final Exercise Prescription Changes):  Exercise Prescription Changes - 08/13/24 1500       Response to Exercise   Blood Pressure (Admit) 116/62    Blood Pressure (Exercise) 126/54    Blood Pressure (Exit) 126/62    Heart Rate (Admit) 86 bpm    Heart Rate (Exercise) 109 bpm    Heart Rate (Exit) 84 bpm    Oxygen Saturation (Admit) 94 %    Oxygen Saturation (Exercise) 94 %    Oxygen Saturation (Exit) 96 %    Rating of Perceived Exertion (Exercise) 13    Perceived Dyspnea (Exercise) 2    Duration Continue with 30 min of aerobic exercise without signs/symptoms of physical distress.    Intensity THRR unchanged      Progression   Progression Continue to progress workloads to maintain intensity without signs/symptoms of physical distress.      Resistance Training   Training Prescription Yes    Weight 4    Reps 10-15      Treadmill   MPH 1.7    Grade 0.5    Minutes 15    METs 2.42      REL-XR   Level 1    Speed 58    Minutes 15    METs 3.8          Nutrition:  Target Goals: Understanding of nutrition guidelines, daily intake of sodium 1500mg , cholesterol 200mg , calories 30% from fat and 7% or less from saturated fats, daily to have 5 or more servings of fruits and vegetables.  Biometrics:  Pre Biometrics - 08/10/24 1517       Pre Biometrics   Height 5' 7 (1.702 m)    Weight 169 lb 11.2 oz (77 kg)    Waist Circumference 33 inches    Hip Circumference 36 inches    Waist to Hip Ratio 0.92 %    BMI (Calculated) 26.57    Grip Strength 33.2 kg    Single Leg Stand 0 seconds           Nutrition Therapy Plan and Nutrition Goals:  Nutrition Therapy & Goals - 08/10/24 1518       Intervention Plan   Intervention Nutrition handout(s) given to patient.;Prescribe, educate and counsel regarding individualized specific dietary modifications aiming towards targeted core components such as weight,  hypertension, lipid management, diabetes, heart failure and other comorbidities.    Expected Outcomes Short Term Goal: Understand basic principles of dietary content, such as calories, fat, sodium, cholesterol and nutrients.;Long Term Goal: Adherence to prescribed nutrition plan.          Nutrition Assessments:  MEDIFICTS Score Key: >=70 Need to make dietary changes  40-70  Heart Healthy Diet <= 40 Therapeutic Level Cholesterol Diet  Flowsheet Row PULMONARY REHAB COPD ORIENTATION from 08/10/2024 in [facility] CARDIAC REHABILITATION  Picture Your Plate Total Score on Admission 63   Picture Your Plate Scores: <59 Unhealthy dietary pattern with much room for improvement. 41-50 Dietary pattern unlikely to meet recommendations for good health and room for improvement. 51-60 More healthful dietary pattern, with some room for improvement.  >60 Healthy dietary pattern, although there may be some specific behaviors that could be improved.    Nutrition Goals Re-Evaluation:  Nutrition Goals Re-Evaluation     Row Name 09/10/24 1529             Goals   Nutrition Goal healthy eating       Comment Glen Cobb is doing well and has been trying to watch what he eats. He has cu back on a lot of sweets due to his A1C. He has gotten that down to 5.6 lst time it was checked. He is eating mostly chicken and fish sometimes and trying to get in the veggies. he does drink water and will do Pepsi zero some.       Expected Outcome Shirt: cut back on pepsi zero   long: continue to pick healthy foods          Nutrition Goals Discharge (Final Nutrition Goals Re-Evaluation):  Nutrition Goals Re-Evaluation - 09/10/24 1529       Goals   Nutrition Goal healthy eating    Comment Glen Cobb is doing well and has been trying to watch what he eats. He has cu back on a lot of sweets due to his A1C. He has gotten that down to 5.6 lst time it was checked. He is eating mostly chicken and fish sometimes and trying to get  in the veggies. he does drink water and will do Pepsi zero some.    Expected Outcome Shirt: cut back on pepsi zero   long: continue to pick healthy foods          Psychosocial: Target Goals: Acknowledge presence or absence of significant depression and/or stress, maximize coping skills, provide positive support system. Participant is able to verbalize types and ability to use techniques and skills needed for reducing stress and depression.  Initial Review & Psychosocial Screening:  Initial Psych Review & Screening - 08/05/24 1117       Initial Review   Current issues with Current Sleep Concerns      Family Dynamics   Good Support System? Yes    Comments His brothers are his main support systems, and he also gets together with his half sister.      Barriers   Psychosocial barriers to participate in program There are no identifiable barriers or psychosocial needs.      Screening Interventions   Interventions Encouraged to exercise;Program counselor consult;Provide feedback about the scores to participant    Expected Outcomes Short Term goal: Utilizing psychosocial counselor, staff and physician to assist with identification of specific Stressors or current issues interfering with healing process. Setting desired goal for each stressor or current issue identified.;Long Term Goal: Stressors or current issues are controlled or eliminated.;Short Term goal: Identification and review with participant of any Quality of Life or Depression concerns found by scoring the questionnaire.;Long Term goal: The participant improves quality of Life and PHQ9 Scores as seen by post scores and/or verbalization of changes          Quality of Life Scores:  Scores of 19 and below  usually indicate a poorer quality of life in these areas.  A difference of  2-3 points is a clinically meaningful difference.  A difference of 2-3 points in the total score of the Quality of Life Index has been associated with  significant improvement in overall quality of life, self-image, physical symptoms, and general health in studies assessing change in quality of life.   PHQ-9: Review Flowsheet       08/10/2024 02/27/2021 10/17/2020  Depression screen PHQ 2/9  Decreased Interest 0 2 1  Down, Depressed, Hopeless 0 0 0  PHQ - 2 Score 0 2 1  Altered sleeping 1 3 2   Tired, decreased energy 1 2 1   Change in appetite 0 2 0  Feeling bad or failure about yourself  0 0 0  Trouble concentrating 0 0 0  Moving slowly or fidgety/restless 0 1 0  Suicidal thoughts 0 0 0  PHQ-9 Score 2 10  4    Difficult doing work/chores Not difficult at all Somewhat difficult Not difficult at all    Details       Data saved with a previous flowsheet row definition        Interpretation of Total Score  Total Score Depression Severity:  1-4 = Minimal depression, 5-9 = Mild depression, 10-14 = Moderate depression, 15-19 = Moderately severe depression, 20-27 = Severe depression   Psychosocial Evaluation and Intervention:  Psychosocial Evaluation - 08/05/24 1127       Psychosocial Evaluation & Interventions   Interventions Encouraged to exercise with the program and follow exercise prescription    Comments Pt was referred to PR by Dr. Abram at the [facility] for COPD.  The pt has been through our PR program before, and his goals for the program this time are to breathe better and be more mobile.  The pt denied any issues with anxiety, depression, or stress.  He does still wake up at night a few times, but he has a prescription for Hydroxyzine as needed for sleep.  He lives alone and has had several falls over the last year, and when he fell in July of last year he fell off the porch and injured his right eye.  He has arthritis in his neck and back, and he has started using a cane for stability.  His main support system are his brothers and his half sister.  He looks forward to starting the program, and we will monitor his progress.     Expected Outcomes Pt will continue to have no identifiable psychosocial barriers.    Continue Psychosocial Services  Follow up required by staff          Psychosocial Re-Evaluation:  Psychosocial Re-Evaluation     Row Name 09/10/24 1526             Psychosocial Re-Evaluation   Current issues with None Identified       Comments Glen Cobb has been doing well in rehab. HE has no major stressor in his life stated that he cant control outcomes so he does not let them stress him out. He also does not have any isseus with sleeping. He gets up natually around 5am - 6am everymorning ike he did when he was working.       Expected Outcomes ShortL: continue to have no issues   ong: continue to exericse for overall wellness       Interventions Encouraged to attend Pulmonary Rehabilitation for the exercise       Continue Psychosocial Services  Follow up required by staff          Psychosocial Discharge (Final Psychosocial Re-Evaluation):  Psychosocial Re-Evaluation - 09/10/24 1526       Psychosocial Re-Evaluation   Current issues with None Identified    Comments Glen Cobb has been doing well in rehab. HE has no major stressor in his life stated that he cant control outcomes so he does not let them stress him out. He also does not have any isseus with sleeping. He gets up natually around 5am - 6am everymorning ike he did when he was working.    Expected Outcomes ShortL: continue to have no issues   ong: continue to exericse for overall wellness    Interventions Encouraged to attend Pulmonary Rehabilitation for the exercise    Continue Psychosocial Services  Follow up required by staff           Education: Education Goals: Education classes will be provided on a weekly basis, covering required topics. Participant will state understanding/return demonstration of topics presented.  Learning Barriers/Preferences:  Learning Barriers/Preferences - 08/05/24 1110       Learning  Barriers/Preferences   Learning Barriers Sight;Exercise Concerns    Learning Preferences Written Material          Education Topics: How Lungs Work and Diseases: - Discuss the anatomy of the lungs and diseases that can affect the lungs, such as COPD. Flowsheet Row PULMONARY REHAB CHRONIC OBSTRUCTIVE PULMONARY DISEASE from 02/02/2021 in [place] [facility]  Date 12/29/20  Educator DJ  Instruction Review Code 1- Verbalizes Understanding    Exercise: -Discuss the importance of exercise, FITT principles of exercise, normal and abnormal responses to exercise, and how to exercise safely.   Environmental Irritants: -Discuss types of environmental irritants and how to limit exposure to environmental irritants. Flowsheet Row PULMONARY REHAB CHRONIC OBSTRUCTIVE PULMONARY DISEASE from 02/02/2021 in [place] [facility]  Date 01/05/21  Educator [facility]  Instruction Review Code 1- Verbalizes Understanding    Meds/Inhalers and oxygen: - Discuss respiratory medications, definition of an inhaler and oxygen, and the proper way to use an inhaler and oxygen. Flowsheet Row PULMONARY REHAB CHRONIC OBSTRUCTIVE PULMONARY DISEASE from 02/02/2021 in [place] [facility]  Date 01/12/21  Educator [facility]    Energy Saving Techniques: - Discuss methods to conserve energy and decrease shortness of breath when performing activities of daily living.  Flowsheet Row PULMONARY REHAB CHRONIC OBSTRUCTIVE PULMONARY DISEASE from 02/02/2021 in [place] [facility]  Date 10/20/20  Educator Hand Out  Instruction Review Code 1- Verbalizes Understanding    Bronchial Hygiene / Breathing Techniques: - Discuss breathing mechanics, pursed-lip breathing technique,  proper posture, effective ways to clear airways, and other functional breathing techniques Flowsheet Row PULMONARY REHAB CHRONIC OBSTRUCTIVE PULMONARY DISEASE from 02/02/2021 in [place] [facility]  Date  10/27/20  Educator pb  Instruction Review Code 1- Verbalizes Understanding    Cleaning Equipment: - Provides group verbal and written instruction about the health risks of elevated stress, cause of high stress, and healthy ways to reduce stress. Flowsheet Row PULMONARY REHAB CHRONIC OBSTRUCTIVE PULMONARY DISEASE from 02/02/2021 in [place] [facility]  Date 11/03/20  Educator Handout  Instruction Review Code 1- Verbalizes Understanding    Nutrition I: Fats: - Discuss the types of cholesterol, what cholesterol does to the body, and how cholesterol levels can be controlled. Flowsheet Row PULMONARY REHAB CHRONIC OBSTRUCTIVE PULMONARY DISEASE from 02/02/2021 in [place] [facility]  Date 11/10/20  Educator handout  Instruction Review  Code 1- Verbalizes Understanding    Nutrition II: Labels: -Discuss the different components of food labels and how to read food labels. Flowsheet Row PULMONARY REHAB CHRONIC OBSTRUCTIVE PULMONARY DISEASE from 02/02/2021 in [place] [facility]  Date 11/17/20  Educator DF  Instruction Review Code 2- Demonstrated Understanding    Respiratory Infections: - Discuss the signs and symptoms of respiratory infections, ways to prevent respiratory infections, and the importance of seeking medical treatment when having a respiratory infection. Flowsheet Row PULMONARY REHAB CHRONIC OBSTRUCTIVE PULMONARY DISEASE from 02/02/2021 in [place] [facility]  Date 11/24/20  Educator DF  Instruction Review Code 2- Demonstrated Understanding    Stress I: Signs and Symptoms: - Discuss the causes of stress, how stress may lead to anxiety and depression, and ways to limit stress. Flowsheet Row PULMONARY REHAB CHRONIC OBSTRUCTIVE PULMONARY DISEASE from 02/02/2021 in [place] [facility]  Date 12/01/20  Educator DF  Instruction Review Code 1- Verbalizes Understanding    Stress II: Relaxation: -Discuss relaxation  techniques to limit stress.   Oxygen for Home/Travel: - Discuss how to prepare for travel when on oxygen and proper ways to transport and store oxygen to ensure safety. Flowsheet Row PULMONARY REHAB CHRONIC OBSTRUCTIVE PULMONARY DISEASE from 02/02/2021 in [place] [facility]  Date 12/15/20  Educator DJ  Instruction Review Code 1- Verbalizes Understanding    Knowledge Questionnaire Score:  Knowledge Questionnaire Score - 08/10/24 1402       Knowledge Questionnaire Score   Pre Score 14/18          Core Components/Risk Factors/Patient Goals at Admission:  Personal Goals and Risk Factors at Admission - 08/10/24 1518       Core Components/Risk Factors/Patient Goals on Admission    Weight Management Yes;Weight Loss;Weight Maintenance    Intervention Weight Management: Provide education and appropriate resources to help participant work on and attain dietary goals.;Weight Management: Develop a combined nutrition and exercise program designed to reach desired caloric intake, while maintaining appropriate intake of nutrient and fiber, sodium and fats, and appropriate energy expenditure required for the weight goal.    Admit Weight 169 lb 11.2 oz (77 kg)    Goal Weight: Short Term 167 lb (75.8 kg)    Goal Weight: Long Term 167 lb (75.8 kg)    Expected Outcomes Weight Maintenance: Understanding of the daily nutrition guidelines, which includes 25-35% calories from fat, 7% or less cal from saturated fats, less than 200mg  cholesterol, less than 1.5gm of sodium, & 5 or more servings of fruits and vegetables daily;Short Term: Continue to assess and modify interventions until short term weight is achieved;Long Term: Adherence to nutrition and physical activity/exercise program aimed toward attainment of established weight goal;Weight Loss: Understanding of general recommendations for a balanced deficit meal plan, which promotes 1-2 lb weight loss per week and includes a negative energy  balance of [phone] kcal/d;Understanding recommendations for meals to include 15-35% energy as protein, 25-35% energy from fat, 35-60% energy from carbohydrates, less than 200mg  of dietary cholesterol, 20-35 gm of total fiber daily;Understanding of distribution of calorie intake throughout the day with the consumption of 4-5 meals/snacks    Tobacco Cessation Yes    Number of packs per day 1/2-3/4 packs    Intervention Assist the participant in steps to quit. Provide individualized education and counseling about committing to Tobacco Cessation, relapse prevention, and pharmacological support that can be provided by physician.;Education officer, environmental, assist with locating and accessing local/national Quit Smoking programs, and support quit date  choice.    Expected Outcomes Short Term: Will demonstrate readiness to quit, by selecting a quit date.;Short Term: Will quit all tobacco product use, adhering to prevention of relapse plan.;Long Term: Complete abstinence from all tobacco products for at least 12 months from quit date.    Improve shortness of breath with ADL's Yes    Intervention Provide education, individualized exercise plan and daily activity instruction to help decrease symptoms of SOB with activities of daily living.    Expected Outcomes Short Term: Improve cardiorespiratory fitness to achieve a reduction of symptoms when performing ADLs;Long Term: Be able to perform more ADLs without symptoms or delay the onset of symptoms    Increase knowledge of respiratory medications and ability to use respiratory devices properly  Yes    Intervention Provide education and demonstration as needed of appropriate use of medications, inhalers, and oxygen therapy.    Expected Outcomes Short Term: Achieves understanding of medications use. Understands that oxygen is a medication prescribed by physician. Demonstrates appropriate use of inhaler and oxygen therapy.;Long Term: Maintain appropriate use of  medications, inhalers, and oxygen therapy.    Diabetes Yes    Intervention Provide education about signs/symptoms and action to take for hypo/hyperglycemia.;Provide education about proper nutrition, including hydration, and aerobic/resistive exercise prescription along with prescribed medications to achieve blood glucose in normal ranges: Fasting glucose 65-99 mg/dL    Expected Outcomes Short Term: Participant verbalizes understanding of the signs/symptoms and immediate care of hyper/hypoglycemia, proper foot care and importance of medication, aerobic/resistive exercise and nutrition plan for blood glucose control.;Long Term: Attainment of HbA1C < 7%.    Hypertension Yes    Intervention Provide education on lifestyle modifcations including regular physical activity/exercise, weight management, moderate sodium restriction and increased consumption of fresh fruit, vegetables, and low fat dairy, alcohol moderation, and smoking cessation.;Monitor prescription use compliance.    Expected Outcomes Short Term: Continued assessment and intervention until BP is < 140/34mm HG in hypertensive participants. < 130/26mm HG in hypertensive participants with diabetes, heart failure or chronic kidney disease.;Long Term: Maintenance of blood pressure at goal levels.    Lipids Yes    Intervention Provide education and support for participant on nutrition & aerobic/resistive exercise along with prescribed medications to achieve LDL 70mg , HDL >40mg .    Expected Outcomes Short Term: Participant states understanding of desired cholesterol values and is compliant with medications prescribed. Participant is following exercise prescription and nutrition guidelines.;Long Term: Cholesterol controlled with medications as prescribed, with individualized exercise RX and with personalized nutrition plan. Value goals: LDL < 70mg , HDL > 40 mg.          Core Components/Risk Factors/Patient Goals Review:   Goals and Risk Factor Review      Row Name 09/10/24 1531             Core Components/Risk Factors/Patient Goals Review   Personal Goals Review Tobacco Cessation;Improve shortness of breath with ADL's       Review Glen Cobb is doing well overal with rehab. He does not let stress bother him. He does continue to smoke and we encourage smokning cessation.       Expected Outcomes Short: smoking cessation   long: continewu to attend rehab          Core Components/Risk Factors/Patient Goals at Discharge (Final Review):   Goals and Risk Factor Review - 09/10/24 1531       Core Components/Risk Factors/Patient Goals Review   Personal Goals Review Tobacco Cessation;Improve shortness of breath with ADL's  Review Glen Cobb is doing well overal with rehab. He does not let stress bother him. He does continue to smoke and we encourage smokning cessation.    Expected Outcomes Short: smoking cessation   long: continewu to attend rehab          ITP Comments:  ITP Comments     Row Name 08/10/24 1419 08/13/24 1349 08/25/24 1113 09/22/24 1633     ITP Comments Patient arrived for 1st visit/orientation/education at 1300. Patient was referred to PR by Dr. Caron Reil from the [facility] due to COPD. During orientation advised patient on arrival and appointment times what to wear, what to do before, during and after exercise. Reviewed attendance and class policy.  Pt is scheduled to return Pulmonary Rehab on 08/13/24 at 1330. Pt was advised to come to class 15 minutes before class starts.  Discussed RPE/Dpysnea scales. Patient participated in warm up stretches. Patient was able to complete 6 minute walk test.  Patient was measured for the equipment. Discussed equipment safety with patient. Took patient pre-anthropometric measurements. Patient finished visit at 1410. First full day of exercise!  Patient was oriented to gym and equipment including functions, settings, policies, and procedures.  Patient's individual exercise prescription and treatment  plan were reviewed.  All starting workloads were established based on the results of the 6 minute walk test done at initial orientation visit.  The plan for exercise progression was also introduced and progression will be customized based on patient's performance and goals. 30 day review completed. ITP sent to Dr.Jehanzeb Memon, Medical Director of  Pulmonary Rehab. Continue with ITP unless changes are made by physician.  Sill newer to program 30 day review completed. ITP sent to Dr.Jehanzeb Memon, Medical Director of  Pulmonary Rehab. Continue with ITP unless changes are made by physician.       Comments: 30 day review      [1]  Current Outpatient Medications:    Acidophilus Lactobacillus CAPS, Take 2 tablets by mouth daily at 12 noon. (Patient not taking: Reported on 08/05/2024), Disp: , Rfl:    acyclovir (ZOVIRAX) 400 MG tablet, Take 400 mg by mouth 2 (two) times daily as needed (for flareup)., Disp: , Rfl:    albuterol  (PROVENTIL ) (2.5 MG/3ML) 0.083% nebulizer solution, Take 2.5 mg by nebulization every 6 (six) hours as needed., Disp: , Rfl:    albuterol -ipratropium (COMBIVENT ) 18-103 MCG/ACT inhaler, Inhale 1 puff into the lungs every 6 (six) hours as needed., Disp: , Rfl:    allopurinol (ZYLOPRIM) 100 MG tablet, Take 100 mg by mouth daily., Disp: , Rfl:    amLODipine  (NORVASC ) 5 MG tablet, Take 10 mg by mouth daily., Disp: , Rfl:    aspirin 81 MG chewable tablet, Chew 81 mg by mouth daily., Disp: , Rfl:    Azelastine HCl 137 MCG/SPRAY SOLN, Place 1 spray into the nose at bedtime. (Patient not taking: Reported on 08/05/2024), Disp: , Rfl:    budesonide -formoterol  (SYMBICORT) 160-4.5 MCG/ACT inhaler, Inhale 2 puffs into the lungs 2 (two) times daily. (Patient not taking: Reported on 08/05/2024), Disp: , Rfl:    carbidopa-levodopa (SINEMET IR) 25-100 MG tablet, Take 0.5 tablets by mouth 2 (two) times daily., Disp: , Rfl:    colchicine 0.6 MG tablet, Take 0.6 mg by mouth as needed., Disp: ,  Rfl:    cyclobenzaprine (FLEXERIL) 10 MG tablet, Take 15 mg by mouth at bedtime., Disp: , Rfl:    fluticasone (FLONASE) 50 MCG/ACT nasal spray, Place 1 spray into both nostrils 3 (  three) times daily., Disp: , Rfl:    gabapentin  (NEURONTIN ) 600 MG tablet, Take 1,200 mg by mouth 3 (three) times daily., Disp: , Rfl:    hydroxychloroquine (PLAQUENIL) 200 MG tablet, Take 400 mg by mouth daily. Monday through Friday, then 1 tablet on Saturday and Sunday, Disp: , Rfl:    hydrOXYzine (VISTARIL) 25 MG capsule, Take 25 mg by mouth every 6 (six) hours as needed for itching (sleep)., Disp: , Rfl:    Hypertonic Nasal Wash (SINUS RINSE BOTTLE KIT NA), Place 1 packet into the nose as needed., Disp: , Rfl:    ipratropium (ATROVENT) 0.03 % nasal spray, Place 2 sprays into both nostrils 3 (three) times daily. (Patient not taking: Reported on 08/05/2024), Disp: , Rfl:    lidocaine  (LIDODERM ) 5 %, Place 1 patch onto the skin daily. Remove & Discard patch within 12 hours or as directed by MD, Disp: , Rfl:    loratadine (CLARITIN) 10 MG tablet, Take 10 mg by mouth daily., Disp: , Rfl:    losartan (COZAAR) 50 MG tablet, Take 25 mg by mouth daily., Disp: , Rfl:    metFORMIN (GLUCOPHAGE) 500 MG tablet, Take 500 mg by mouth 2 (two) times daily with a meal., Disp: , Rfl:    Mometasone  Furoate 200 MCG/ACT AERO, Take 1 puff by mouth 2 (two) times daily., Disp: , Rfl:    nicotine  (NICODERM CQ  - DOSED IN MG/24 HOURS) 21 mg/24hr patch, Place 21 mg onto the skin daily. (Patient not taking: Reported on 08/05/2024), Disp: , Rfl:    nicotine  (NICODERM CQ  - DOSED IN MG/24 HR) 7 mg/24hr patch, Place 7 mg onto the skin daily. (Patient not taking: Reported on 08/05/2024), Disp: , Rfl:    nicotine  polacrilex (COMMIT) 2 MG lozenge, Take 2 mg by mouth as needed for smoking cessation. (Patient not taking: Reported on 08/05/2024), Disp: , Rfl:    omeprazole (PRILOSEC) 40 MG capsule, Take 40 mg by mouth daily., Disp: , Rfl:    potassium  chloride (KLOR-CON ) 10 MEQ tablet, Take 10 mEq by mouth daily. (Patient not taking: Reported on 08/05/2024), Disp: , Rfl:    predniSONE  (DELTASONE ) 20 MG tablet, Take 3 tablets by mouth daily x1 day; then 2 tablets by mouth daily x2 days; then 1 tablet by mouth daily x3 days; then half tablet by mouth daily x3 days and stop prednisone . (Patient not taking: Reported on 08/05/2024), Disp: 30 tablet, Rfl: 0   Propylene Glycol (SYSTANE BALANCE OP), Apply 1 drop to eye 4 (four) times daily. Both eyes, Disp: , Rfl:    Riboflavin 100 MG TABS, Take 400 mg by mouth daily. (Patient not taking: Reported on 08/05/2024), Disp: , Rfl:    rosuvastatin (CRESTOR) 40 MG tablet, Take 20 mg by mouth daily., Disp: , Rfl:    sildenafil (VIAGRA) 100 MG tablet, Take 50 mg by mouth daily as needed for erectile dysfunction., Disp: , Rfl:    SUMAtriptan (IMITREX) 100 MG tablet, Take 100 mg by mouth as needed., Disp: , Rfl:    Tiotropium Bromide Monohydrate (SPIRIVA RESPIMAT) 2.5 MCG/ACT AERS, Inhale 2 puffs into the lungs daily. (Patient not taking: Reported on 08/05/2024), Disp: , Rfl:    Tiotropium Bromide-Olodaterol 2.5-2.5 MCG/ACT AERS, Take 2 puffs by mouth daily., Disp: , Rfl:    traMADol (ULTRAM) 50 MG tablet, Take 100 mg by mouth 3 (three) times daily as needed for moderate pain., Disp: , Rfl:  [2]  Social History Tobacco Use  Smoking Status Every Day  Current packs/day: 0.25   Average packs/day: 0.3 packs/day for 46.0 years (11.5 ttl pk-yrs)   Types: Cigarettes  Smokeless Tobacco Former   Types: Snuff  Tobacco Comments   using a nicotine  patch, trying to quit

## 2024-09-29 ENCOUNTER — Encounter (HOSPITAL_COMMUNITY)
Admission: RE | Admit: 2024-09-29 | Discharge: 2024-09-29 | Disposition: A | Discharge status: Home / Self Care | Source: Ambulatory Visit | Attending: Internal Medicine | Admitting: Internal Medicine

## 2024-09-29 DIAGNOSIS — J449 Chronic obstructive pulmonary disease, unspecified: Secondary | ICD-10-CM | POA: Diagnosis present

## 2024-09-29 NOTE — Progress Notes (Signed)
 Daily Session Note  Patient Details  Name: Glen Cobb MRN: [id number] Date of Birth: [birth year] Referring Provider:   Flowsheet Row PULMONARY REHAB COPD ORIENTATION from 08/10/2024 in [facility]  Referring Provider Abram Clap MD    Encounter Date: 09/29/2024  Check In:  Session Check In - 09/29/24 1315       Check-In   Supervising physician immediately available to respond to emergencies See telemetry face sheet for immediately available MD    Location [facility]    Staff Present Laymon Rattler, BSN, RN, WTA-C;Heather Con, BS, Exercise Physiologist;Victoria Zina, RN    Virtual Visit No    Medication changes reported     No    Fall or balance concerns reported    No    Tobacco Cessation No Change    Warm-up and Cool-down Performed on first and last piece of equipment    Resistance Training Performed Yes    VAD Patient? No    PAD/SET Patient? No      Pain Assessment   Currently in Pain? No/denies          Capillary Blood Glucose: No results found for this or any previous visit (from the past 24 hours).    Tobacco Use History[1]  Goals Met:  Proper associated with RPD/PD & O2 Sat Independence with exercise equipment Improved SOB with ADL's Using PLB without cueing & demonstrates good technique Exercise tolerated well No report of concerns or symptoms today Strength training completed today  Goals Unmet:  Not Applicable  Comments: Pt able to follow exercise prescription today without complaint.  Will continue to monitor for progression.         [1]  Social History Tobacco Use  Smoking Status Every Day   Current packs/day: 0.25   Average packs/day: 0.3 packs/day for 46.0 years (11.5 ttl pk-yrs)   Types: Cigarettes  Smokeless Tobacco Former   Types: Snuff  Tobacco Comments   using a nicotine  patch, trying to quit

## 2024-09-30 ENCOUNTER — Other Ambulatory Visit (HOSPITAL_COMMUNITY): Payer: Self-pay | Admitting: Physician Assistant

## 2024-09-30 DIAGNOSIS — Z87891 Personal history of nicotine dependence: Secondary | ICD-10-CM

## 2024-10-01 ENCOUNTER — Encounter (HOSPITAL_COMMUNITY)
Admission: RE | Admit: 2024-10-01 | Discharge: 2024-10-01 | Disposition: A | Discharge status: Home / Self Care | Source: Ambulatory Visit | Attending: Internal Medicine | Admitting: Internal Medicine

## 2024-10-01 DIAGNOSIS — J449 Chronic obstructive pulmonary disease, unspecified: Secondary | ICD-10-CM

## 2024-10-01 NOTE — Progress Notes (Signed)
 Daily Session Note  Patient Details  Name: Glen Cobb MRN: [id number] Date of Birth: 08/18/1956 Referring Provider:   Flowsheet Row PULMONARY REHAB COPD ORIENTATION from 08/10/2024 in [facility]  Referring Provider Abram Clap MD    Encounter Date: 10/01/2024  Check In:  Session Check In - 10/01/24 1345       Check-In   Supervising physician immediately available to respond to emergencies See telemetry face sheet for immediately available MD    Location [facility]    Staff Present Powell Benders, BS, Exercise Physiologist;Hillary Trenton BSN, RN;Jessica McMurray, MA, RCEP, CCRP, CCET    Virtual Visit No    Medication changes reported     No    Fall or balance concerns reported    No    Warm-up and Cool-down Performed on first and last piece of equipment    Resistance Training Performed Yes    VAD Patient? No    PAD/SET Patient? No      Pain Assessment   Currently in Pain? No/denies    Multiple Pain Sites No          Capillary Blood Glucose: No results found for this or any previous visit (from the past 24 hours).    Tobacco Use History[1]  Goals Met:  Independence with exercise equipment Using PLB without cueing & demonstrates good technique Exercise tolerated well Queuing for purse lip breathing No report of concerns or symptoms today Strength training completed today  Goals Unmet:  Not Applicable  Comments: Pt able to follow exercise prescription today without complaint.  Will continue to monitor for progression.        [1]  Social History Tobacco Use  Smoking Status Every Day   Current packs/day: 0.25   Average packs/day: 0.3 packs/day for 46.0 years (11.5 ttl pk-yrs)   Types: Cigarettes  Smokeless Tobacco Former   Types: Snuff  Tobacco Comments   using a nicotine  patch, trying to quit

## 2024-10-06 ENCOUNTER — Encounter (HOSPITAL_COMMUNITY)
Admission: RE | Admit: 2024-10-06 | Discharge: 2024-10-06 | Disposition: A | Source: Ambulatory Visit | Attending: Internal Medicine

## 2024-10-06 DIAGNOSIS — J449 Chronic obstructive pulmonary disease, unspecified: Secondary | ICD-10-CM | POA: Diagnosis not present

## 2024-10-06 NOTE — Progress Notes (Signed)
 Daily Session Note  Patient Details  Name: Glen Cobb MRN: [id number] Date of Birth: 04-Oct-1955 Referring Provider:   Flowsheet Row PULMONARY REHAB COPD ORIENTATION from 08/10/2024 in [facility]  Referring Provider Abram Clap MD    Encounter Date: 10/06/2024  Check In:  Session Check In - 10/06/24 1334       Check-In   Supervising physician immediately available to respond to emergencies See telemetry face sheet for immediately available MD    Location [facility]    Staff Present Laymon Rattler, BSN, RN, WTA-C;Heather Con, BS, Exercise Physiologist;Victoria Zina, RN    Virtual Visit No    Medication changes reported     No    Fall or balance concerns reported    No    Tobacco Cessation No Change    Warm-up and Cool-down Performed on first and last piece of equipment    Resistance Training Performed Yes    VAD Patient? No    PAD/SET Patient? No      Pain Assessment   Currently in Pain? No/denies          Capillary Blood Glucose: No results found for this or any previous visit (from the past 24 hours).    Tobacco Use History[1]  Goals Met:  Proper associated with RPD/PD & O2 Sat Independence with exercise equipment Improved SOB with ADL's Using PLB without cueing & demonstrates good technique Exercise tolerated well No report of concerns or symptoms today Strength training completed today  Goals Unmet:  Not Applicable  Comments: Pt able to follow exercise prescription today without complaint.  Will continue to monitor for progression.        [1]  Social History Tobacco Use  Smoking Status Every Day   Current packs/day: 0.25   Average packs/day: 0.3 packs/day for 46.0 years (11.5 ttl pk-yrs)   Types: Cigarettes  Smokeless Tobacco Former   Types: Snuff  Tobacco Comments   using a nicotine  patch, trying to quit

## 2024-10-08 ENCOUNTER — Encounter (HOSPITAL_COMMUNITY)
Admission: RE | Admit: 2024-10-08 | Discharge: 2024-10-08 | Disposition: A | Source: Ambulatory Visit | Attending: Internal Medicine

## 2024-10-08 DIAGNOSIS — J449 Chronic obstructive pulmonary disease, unspecified: Secondary | ICD-10-CM

## 2024-10-08 NOTE — Progress Notes (Signed)
 Daily Session Note  Patient Details  Name: Glen Cobb MRN: [id number] Date of Birth: 1956/07/16 Referring Provider:   Flowsheet Row PULMONARY REHAB COPD ORIENTATION from 08/10/2024 in [facility] C[facility] RE[facility]  Referring Provider Abram Clap MD    Encounter Date: 10/08/2024  Check In:  Session Check In - 10/08/24 1315       Check-In   Supervising physician immediately available to respond to emergencies See telemetry face sheet for immediately available MD    Location [facility]    Staff Present Adrien Louder, RN, BSN;Heather Con HECKLE, Exercise Physiologist;Hillary Dean BSN, RN    Virtual Visit No    Medication changes reported     No    Fall or balance concerns reported    No    Tobacco Cessation No Change    Current number of cigarettes/nicotine  per day     10    Warm-up and Cool-down Performed on first and last piece of equipment    Resistance Training Performed Yes    VAD Patient? No    PAD/SET Patient? No      Pain Assessment   Currently in Pain? No/denies    Multiple Pain Sites No          Capillary Blood Glucose: No results found for this or any previous visit (from the past 24 hours).    Tobacco Use History[1]  Goals Met:  Independence with exercise equipment Exercise tolerated well No report of concerns or symptoms today Strength training completed today  Goals Unmet:  Not Applicable  Comments: Pt able to follow exercise prescription today without complaint.  Will continue to monitor for progression.        [1]  Social History Tobacco Use  Smoking Status Every Day   Current packs/day: 0.25   Average packs/day: 0.3 packs/day for 46.0 years (11.5 ttl pk-yrs)   Types: Cigarettes  Smokeless Tobacco Former   Types: Snuff  Tobacco Comments   using a nicotine  patch, trying to quit

## 2024-10-13 ENCOUNTER — Encounter (HOSPITAL_COMMUNITY)
Admission: RE | Admit: 2024-10-13 | Discharge: 2024-10-13 | Disposition: A | Source: Ambulatory Visit | Attending: Internal Medicine

## 2024-10-13 DIAGNOSIS — J449 Chronic obstructive pulmonary disease, unspecified: Secondary | ICD-10-CM

## 2024-10-13 NOTE — Progress Notes (Signed)
 Daily Session Note  Patient Details  Name: Glen Cobb MRN: [id number] Date of Birth: 01-09-1956 Referring Provider:   Flowsheet Row PULMONARY REHAB COPD ORIENTATION from 08/10/2024 in [facility]AC [facility]  Referring Provider Abram Clap MD    Encounter Date: 10/13/2024  Check In:  Session Check In - 10/13/24 1333       Check-In   Supervising physician immediately available to respond to emergencies See telemetry face sheet for immediately available MD    Location [facility]    Staff Present Laymon Rattler, BSN, RN, WTA-C;Heather Con, BS, Exercise Physiologist;Frans Valente Zina, RN    Virtual Visit No    Medication changes reported     No    Fall or balance concerns reported    No    Warm-up and Cool-down Performed on first and last piece of equipment    Resistance Training Performed Yes    VAD Patient? No    PAD/SET Patient? No      Pain Assessment   Currently in Pain? No/denies          Capillary Blood Glucose: No results found for this or any previous visit (from the past 24 hours).    Tobacco Use History[1]  Goals Met:  Independence with exercise equipment Exercise tolerated well No report of concerns or symptoms today Strength training completed today  Goals Unmet:  Not Applicable  Comments: Pt able to follow exercise prescription today without complaint.  Will continue to monitor for progression.        [1]  Social History Tobacco Use  Smoking Status Every Day   Current packs/day: 0.25   Average packs/day: 0.3 packs/day for 46.0 years (11.5 ttl pk-yrs)   Types: Cigarettes  Smokeless Tobacco Former   Types: Snuff  Tobacco Comments   using a nicotine  patch, trying to quit

## 2024-10-15 ENCOUNTER — Encounter (HOSPITAL_COMMUNITY)
Admission: RE | Admit: 2024-10-15 | Discharge: 2024-10-15 | Disposition: A | Discharge status: Home / Self Care | Source: Ambulatory Visit | Attending: Internal Medicine | Admitting: Internal Medicine

## 2024-10-15 DIAGNOSIS — J449 Chronic obstructive pulmonary disease, unspecified: Secondary | ICD-10-CM | POA: Diagnosis not present

## 2024-10-15 NOTE — Progress Notes (Signed)
 Daily Session Note  Patient Details  Name: Glen Cobb MRN: [id number] Date of Birth: 1956/09/13 Referring Provider:   Flowsheet Row PULMONARY REHAB COPD ORIENTATION from 08/10/2024 in [facility] CARDIAC REHABILITATION  Referring Provider Abram Clap MD    Encounter Date: 10/15/2024  Check In:  Session Check In - 10/15/24 1315       Check-In   Supervising physician immediately available to respond to emergencies See telemetry face sheet for immediately available MD    Location [facility]    Staff Present Rolland Sake BSN, RN;Ariona Deschene Vicci, RN, BSN;Heather Con, BS, Exercise Physiologist    Virtual Visit No    Medication changes reported     No    Fall or balance concerns reported    No    Tobacco Cessation No Change    Current number of cigarettes/nicotine  per day     10    Warm-up and Cool-down Performed on first and last piece of equipment    Resistance Training Performed Yes    VAD Patient? No    PAD/SET Patient? No      Pain Assessment   Currently in Pain? No/denies    Multiple Pain Sites No          Capillary Blood Glucose: No results found for this or any previous visit (from the past 24 hours).    Tobacco Use History[1]  Goals Met:  Independence with exercise equipment Exercise tolerated well No report of concerns or symptoms today Strength training completed today  Goals Unmet:  Not Applicable  Comments: Pt able to follow exercise prescription today without complaint.  Will continue to monitor for progression.        [1]  Social History Tobacco Use  Smoking Status Every Day   Current packs/day: 0.25   Average packs/day: 0.3 packs/day for 46.0 years (11.5 ttl pk-yrs)   Types: Cigarettes  Smokeless Tobacco Former   Types: Snuff  Tobacco Comments   using a nicotine  patch, trying to quit

## 2024-10-16 ENCOUNTER — Ambulatory Visit (HOSPITAL_COMMUNITY)
Admission: RE | Admit: 2024-10-16 | Discharge: 2024-10-16 | Disposition: A | Source: Ambulatory Visit | Attending: Physician Assistant | Admitting: Physician Assistant

## 2024-10-16 DIAGNOSIS — Z87891 Personal history of nicotine dependence: Secondary | ICD-10-CM | POA: Diagnosis present

## 2024-10-20 ENCOUNTER — Encounter (HOSPITAL_COMMUNITY): Payer: Self-pay

## 2024-10-20 ENCOUNTER — Encounter (HOSPITAL_COMMUNITY)

## 2024-10-20 DIAGNOSIS — J449 Chronic obstructive pulmonary disease, unspecified: Secondary | ICD-10-CM

## 2024-10-20 NOTE — Progress Notes (Signed)
 Pulmonary Individual Treatment Plan  Patient Details  Name: Glen Cobb MRN: [id number] Date of Birth: 04/19/1956 Referring Provider:   Flowsheet Row PULMONARY REHAB COPD ORIENTATION from 08/10/2024 in [facility]  Referring Provider Abram Clap MD    Initial Encounter Date:  Flowsheet Row PULMONARY REHAB COPD ORIENTATION from 08/10/2024 in [place] [facility]  Date 08/10/24    Visit Diagnosis: Chronic obstructive pulmonary disease, unspecified COPD type (HCC)  Patient's Home Medications on Admission: Current Medications[1]  Past Medical History: Past Medical History:  Diagnosis Date   Arthritis    Cancer (HCC)    bladder cancer x 2   Cataract    MD just watching   COPD (chronic obstructive pulmonary disease) (HCC)    Emphysema of lung (HCC)    FH: migraines    last one 1 yr ago - OTC med prn   Gout    HSV infection    Hyperlipidemia    no med. diet   Hypertension    Kidney stone    passed stone - no surgery   Osteoporosis    Sleep apnea    Does not use CPAP nightly.    Tobacco Use: Tobacco Use History[2]  Labs: Review Flowsheet        No data to display           Pulmonary Assessment Scores:  Pulmonary Assessment Scores     Row Name 08/10/24 1400         ADL UCSD   ADL Phase Entry     SOB Score total 24     Rest 1     Walk 2     Stairs 4     Bath 1     Dress 0     Shop 0       CAT Score   CAT Score 20       mMRC Score   mMRC Score 2        UCSD: Self-administered rating of dyspnea associated with activities of daily living (ADLs) 6-point scale (0 = not at all to 5 = maximal or unable to do because of breathlessness)  Scoring Scores range from 0 to 120.  Minimally important difference is 5 units  CAT: CAT can identify the health impairment of COPD patients and is better correlated with disease progression.  CAT has a scoring range of zero to 40. The CAT score is classified into four  groups of low (less than 10), medium (10 - 20), high (21-30) and very high (31-40) based on the impact level of disease on health status. A CAT score over 10 suggests significant symptoms.  A worsening CAT score could be explained by an exacerbation, poor medication adherence, poor inhaler technique, or progression of COPD or comorbid conditions.  CAT MCID is 2 points  mMRC: mMRC (Modified Medical Research Council) Dyspnea Scale is used to assess the degree of baseline functional disability in patients of respiratory disease due to dyspnea. No minimal important difference is established. A decrease in score of 1 point or greater is considered a positive change.   Pulmonary Function Assessment:   Exercise Target Goals: Exercise Program Goal: Individual exercise prescription set using results from initial 6 min walk test and THRR while considering  patients activity barriers and safety.   Exercise Prescription Goal: Initial exercise prescription builds to 30-45 minutes a day of aerobic activity, 2-3 days per week.  Home exercise guidelines will be given to patient during program  as part of exercise prescription that the participant will acknowledge.  Education: Aerobic Exercise: - Group verbal and visual presentation on the components of exercise prescription. Introduces F.I.T.T principle from ACSM for exercise prescriptions.  Reviews F.I.T.T. principles of aerobic exercise including progression. Written material provided at class time.   Education: Resistance Exercise: - Group verbal and visual presentation on the components of exercise prescription. Introduces F.I.T.T principle from ACSM for exercise prescriptions  Reviews F.I.T.T. principles of resistance exercise including progression. Written material provided at class time.    Education: Exercise & Equipment Safety: - Individual verbal instruction and demonstration of equipment use and safety with use of the equipment.   Education:  Exercise Physiology & General Exercise Guidelines: - Group verbal and written instruction with models to review the exercise physiology of the cardiovascular system and associated critical values. Provides general exercise guidelines with specific guidelines to those with heart or lung disease.    Education: Flexibility, Balance, Mind/Body Relaxation: - Group verbal and visual presentation with interactive activity on the components of exercise prescription. Introduces F.I.T.T principle from ACSM for exercise prescriptions. Reviews F.I.T.T. principles of flexibility and balance exercise training including progression. Also discusses the mind body connection.  Reviews various relaxation techniques to help reduce and manage stress (i.e. Deep breathing, progressive muscle relaxation, and visualization). Balance handout provided to take home. Written material provided at class time.   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 HR 96 bpm     Max Ex. BP 144/62     2 Minute Post BP 142/72       Interval HR   1  Minute HR 89     2 Minute HR 92     3 Minute HR 92     4 Minute HR 93     5 Minute HR 96     6 Minute HR 95     2 Minute Post HR 80     Interval Heart Rate? Yes       Interval Oxygen   Interval Oxygen? Yes     Baseline Oxygen Saturation % 96 %     1 Minute Oxygen Saturation % 92 %     1 Minute Liters of Oxygen 0 L  Room  AIr     2 Minute Oxygen Saturation % 93 %     2 Minute Liters of Oxygen 0 L     3 Minute Oxygen Saturation % 93 %     3 Minute Liters of Oxygen 0 L     4 Minute Oxygen Saturation % 91 %     4 Minute Liters of Oxygen 0 L     5 Minute Oxygen Saturation % 91 %     5 Minute Liters of Oxygen 0 L     6 Minute Oxygen Saturation % 91 %     6 Minute Liters of Oxygen 0 L     2 Minute Post Oxygen Saturation % 97 %     2 Minute Post Liters of Oxygen 0 L       Oxygen Initial Assessment:  Oxygen Initial Assessment - 08/05/24 1109       Home Oxygen   Home Oxygen Device None    Sleep Oxygen Prescription CPAP   pt does not wear CPAP regularly   Home Exercise Oxygen Prescription None    Home Resting Oxygen Prescription None      Initial 6 min Walk   Oxygen Used None      Program Oxygen Prescription   Program Oxygen Prescription None      Intervention   Short Term Goals To learn and understand importance of monitoring SPO2 with pulse oximeter and demonstrate accurate use of the pulse oximeter.;To learn and understand importance of maintaining oxygen saturations>88%;To learn and demonstrate proper use of respiratory medications;To learn and demonstrate proper pursed lip breathing techniques or other breathing techniques.     Long  Term Goals Verbalizes importance of monitoring SPO2 with pulse oximeter and return demonstration;Maintenance of O2 saturations>88%;Exhibits proper breathing techniques, such as pursed lip breathing or other method taught during program session;Compliance with respiratory medication;Demonstrates proper use of MDIs          Oxygen  Re-Evaluation:  Oxygen Re-Evaluation     Row Name 08/13/24 1349 09/29/24 1424           Program Oxygen Prescription   Program Oxygen Prescription -- None        Home Oxygen   Home Oxygen Device -- None      Sleep Oxygen Prescription -- CPAP      Home Exercise Oxygen Prescription -- None      Home Resting Oxygen Prescription -- None      Compliance with Home Oxygen Use -- No        Goals/Expected Outcomes   Short Term Goals To learn and understand importance of maintaining oxygen saturations>88%;To learn and understand importance of monitoring SPO2 with pulse oximeter and demonstrate accurate use of the pulse oximeter.;To learn and demonstrate proper pursed lip breathing techniques or other breathing techniques.  To learn and understand importance of maintaining oxygen saturations>88%;To learn and understand importance of monitoring SPO2 with pulse oximeter and demonstrate accurate use of the pulse oximeter.;To learn and demonstrate proper pursed lip breathing techniques or other breathing techniques.       Long  Term Goals Maintenance of O2 saturations>88%;Verbalizes importance of monitoring SPO2 with pulse oximeter and return demonstration;Exhibits proper breathing techniques, such as pursed lip breathing or other method taught during program session Maintenance of O2 saturations>88%;Verbalizes importance of monitoring SPO2 with pulse oximeter and return demonstration;Exhibits proper breathing techniques, such as pursed lip breathing or other method taught during program session      Comments Reviewed PLB technique  with pt.  Talked about how it works and it's importance in maintaining their exercise saturations. Nada notes he is prescribed home CPAP machine but he does not use because it is not comfortable for him. Talked with him the importance of them and that he needs to discuss with his doctor about other options.      Goals/Expected Outcomes Short: Become more profiecient at using PLB.    Long: Become independent at using PLB. Short: follow up with MD about other options for CPAP Long: continue to attend rehab.         Oxygen Discharge (Final Oxygen Re-Evaluation):  Oxygen Re-Evaluation - 09/29/24 1424       Program Oxygen Prescription   Program Oxygen Prescription None      Home Oxygen   Home Oxygen Device None    Sleep Oxygen Prescription CPAP    Home Exercise Oxygen Prescription None    Home Resting Oxygen Prescription None    Compliance with Home Oxygen Use No      Goals/Expected Outcomes   Short Term Goals To learn and understand importance of maintaining oxygen saturations>88%;To learn and understand importance of monitoring SPO2 with pulse oximeter and demonstrate accurate use of the pulse oximeter.;To learn and demonstrate proper pursed lip breathing techniques or other breathing techniques.     Long  Term Goals Maintenance of O2 saturations>88%;Verbalizes importance of monitoring SPO2 with pulse oximeter and return demonstration;Exhibits proper breathing techniques, such as pursed lip breathing or other method taught during program session    Comments Urias notes he is prescribed home CPAP machine but he does not use because it is not comfortable for him. Talked with him the importance of them and that he needs to discuss with his doctor about other options.    Goals/Expected Outcomes Short: follow up with MD about other options for CPAP Long: continue to attend rehab.          Initial Exercise Prescription:  Initial Exercise Prescription - 08/10/24 1500       Date of Initial Exercise RX and Referring Provider   Date 08/10/24    Referring Provider Abram Clap MD      Oxygen   Maintain Oxygen Saturation 88% or higher      Treadmill   MPH 1.7    Grade 0.5    Minutes 15    METs 2.42      REL-XR   Level 1    Speed 50    Minutes 15    METs 2.5      Prescription Details   Frequency (times per week) 2    Duration Progress to 30 minutes of  continuous aerobic without signs/symptoms of physical distress      Intensity   THRR 40-80% of Max Heartrate 108-139    Ratings of Perceived Exertion 11-13    Perceived Dyspnea 0-4      Progression   Progression Continue to progress workloads to maintain intensity without signs/symptoms of physical distress.      Resistance Training   Training Prescription Yes    Weight 4 lb    Reps 10-15          Perform Capillary Blood Glucose checks as needed.  Exercise Prescription Changes:   Exercise Prescription Changes     Row Name 08/10/24 1500 08/13/24 1500 09/22/24 1500         Response to Exercise   Blood Pressure (Admit) 122/64 116/62 112/70     Blood Pressure (Exercise) 144/62 126/54  120/80     Blood Pressure (Exit) 132/70 126/62 106/70     Heart Rate (Admit) 77 bpm 86 bpm 83 bpm     Heart Rate (Exercise) 96 bpm 109 bpm 106 bpm     Heart Rate (Exit) 68 bpm 84 bpm 90 bpm     Oxygen Saturation (Admit) 96 % 94 % 100 %     Oxygen Saturation (Exercise) 91 % 94 % 98 %     Oxygen Saturation (Exit) 100 % 96 % 98 %     Rating of Perceived Exertion (Exercise) 12 13 12      Perceived Dyspnea (Exercise) 2 2 2      Symptoms chroinc hip pain 3/10 -- --     Comments wakl test results -- --     Duration -- Continue with 30 min of aerobic exercise without signs/symptoms of physical distress. Continue with 30 min of aerobic exercise without signs/symptoms of physical distress.     Intensity -- THRR unchanged THRR unchanged       Progression   Progression -- Continue to progress workloads to maintain intensity without signs/symptoms of physical distress. Continue to progress workloads to maintain intensity without signs/symptoms of physical distress.       Resistance Training   Training Prescription -- Yes --     Weight -- 4 4     Reps -- 10-15 10-15       Treadmill   MPH -- 1.7 2     Grade -- 0.5 1     Minutes -- 15 15     METs -- 2.42 2.81       REL-XR   Level -- 1 4     Speed  -- 58 58     Minutes -- 15 15     METs -- 3.8 4.8        Exercise Comments:   Exercise Comments     Row Name 08/13/24 1349           Exercise Comments First full day of exercise!  Patient was oriented to gym and equipment including functions, settings, policies, and procedures.  Patient's individual exercise prescription and treatment plan were reviewed.  All starting workloads were established based on the results of the 6 minute walk test done at initial orientation visit.  The plan for exercise progression was also introduced and progression will be customized based on patient's performance and goals.          Exercise Goals and Review:   Exercise Goals     Row Name 08/10/24 1517             Exercise Goals   Increase Physical Activity Yes       Intervention Provide advice, education, support and counseling about physical activity/exercise needs.;Develop an individualized exercise prescription for aerobic and resistive training based on initial evaluation findings, risk stratification, comorbidities and participant's personal goals.       Expected Outcomes Short Term: Attend rehab on a regular basis to increase amount of physical activity.;Long Term: Add in home exercise to make exercise part of routine and to increase amount of physical activity.;Long Term: Exercising regularly at least 3-5 days a week.       Increase Strength and Stamina Yes       Intervention Provide advice, education, support and counseling about physical activity/exercise needs.;Develop an individualized exercise prescription for aerobic and resistive training based on initial evaluation findings, risk stratification, comorbidities and participant's personal goals.  Expected Outcomes Short Term: Increase workloads from initial exercise prescription for resistance, speed, and METs.;Short Term: Perform resistance training exercises routinely during rehab and add in resistance training at home;Long Term:  Improve cardiorespiratory fitness, muscular endurance and strength as measured by increased METs and functional capacity ( )       Able to understand and use rate of perceived exertion (RPE) scale Yes       Intervention Provide education and explanation on how to use RPE scale       Expected Outcomes Short Term: Able to use RPE daily in rehab to express subjective intensity level;Long Term:  Able to use RPE to guide intensity level when exercising independently       Able to understand and use Dyspnea scale Yes       Intervention Provide education and explanation on how to use Dyspnea scale       Expected Outcomes Short Term: Able to use Dyspnea scale daily in rehab to express subjective sense of shortness of breath during exertion;Long Term: Able to use Dyspnea scale to guide intensity level when exercising independently       Knowledge and understanding of Target Heart Rate Range (THRR) Yes       Intervention Provide education and explanation of THRR including how the numbers were predicted and where they are located for reference       Expected Outcomes Short Term: Able to state/look up THRR;Long Term: Able to use THRR to govern intensity when exercising independently;Short Term: Able to use daily as guideline for intensity in rehab       Able to check pulse independently Yes       Intervention Provide education and demonstration on how to check pulse in carotid and radial arteries.;Review the importance of being able to check your own pulse for safety during independent exercise       Expected Outcomes Long Term: Able to check pulse independently and accurately;Short Term: Able to explain why pulse checking is important during independent exercise       Understanding of Exercise Prescription Yes       Intervention Provide education, explanation, and written materials on patient's individual exercise prescription       Expected Outcomes Short Term: Able to explain program exercise  prescription;Long Term: Able to explain home exercise prescription to exercise independently          Exercise Goals Re-Evaluation :  Exercise Goals Re-Evaluation     Row Name 08/13/24 1349 08/17/24 9191 09/10/24 1525 09/23/24 1115 09/29/24 1422     Exercise Goal Re-Evaluation   Exercise Goals Review Understanding of Exercise Prescription;Knowledge and understanding of Target Heart Rate Range (THRR);Able to understand and use rate of perceived exertion (RPE) scale;Able to understand and use Dyspnea scale Increase Strength and Stamina;Increase Physical Activity;Understanding of Exercise Prescription Increase Physical Activity;Increase Strength and Stamina;Understanding of Exercise Prescription Increase Physical Activity;Increase Strength and Stamina;Understanding of Exercise Prescription Increase Physical Activity;Able to understand and use rate of perceived exertion (RPE) scale;Knowledge and understanding of Target Heart Rate Range (THRR);Understanding of Exercise Prescription;Increase Strength and Stamina;Able to understand and use Dyspnea scale;Able to check pulse independently   Comments Reviewed RPE and dyspnea scale, THR and program prescription with pt today.  Pt voiced understanding and was given a copy of goals to take home. Xian just started rehab. He did well on his first visit. Will continue to monitor and progress as able Shamari is doing well in rehab. He just started so he has not  noticed an increase yet in stamin/.; HE is walking his driveway at home Jennie is doing well in rehab he has completed 8 session in [place]. HE has incresed his walking speed on the treadmill and incresed to level 4 on the XR. Will continue to montior amd progress as asble Beauford is doing great in rehab! He notes he walks his driveway (which is long) about everyday. He hopes to excercise more now that hunting season is over.   Expected Outcomes Short: Use RPE daily to regulate intensity.  Long: Follow program  prescription in THR. Short: continue to attend rehab    long: add in exercise at home Short: continue to attend rehab   Long: continue to exercise at homw Short: continue to attend rehab   Long: continue to exercise at homw Short: continue to attend rehab   Long: continue to exercise at home      Discharge Exercise Prescription (Final Exercise Prescription Changes):  Exercise Prescription Changes - 09/22/24 1500       Response to Exercise   Blood Pressure (Admit) 112/70    Blood Pressure (Exercise) 120/80    Blood Pressure (Exit) 106/70    Heart Rate (Admit) 83 bpm    Heart Rate (Exercise) 106 bpm    Heart Rate (Exit) 90 bpm    Oxygen Saturation (Admit) 100 %    Oxygen Saturation (Exercise) 98 %    Oxygen Saturation (Exit) 98 %    Rating of Perceived Exertion (Exercise) 12    Perceived Dyspnea (Exercise) 2    Duration Continue with 30 min of aerobic exercise without signs/symptoms of physical distress.    Intensity THRR unchanged      Progression   Progression Continue to progress workloads to maintain intensity without signs/symptoms of physical distress.      Resistance Training   Weight 4    Reps 10-15      Treadmill   MPH 2    Grade 1    Minutes 15    METs 2.81      REL-XR   Level 4    Speed 58    Minutes 15    METs 4.8          Nutrition:  Target Goals: Understanding of nutrition guidelines, daily intake of sodium 1500mg , cholesterol 200mg , calories 30% from fat and 7% or less from saturated fats, daily to have 5 or more servings of fruits and vegetables.  Education: Nutrition 1 -Group instruction provided by verbal, written material, interactive activities, discussions, models, and posters to present general guidelines for heart healthy nutrition including macronutrients, label reading, and promoting whole foods over processed counterparts. Education serves as pensions consultant of discussion of heart healthy eating for all. Written material provided at class  time. Flowsheet Row PULMONARY REHAB CHRONIC OBSTRUCTIVE PULMONARY DISEASE from 10/08/2024 in [facility]  Date 10/08/24  Educator [facility]  Instruction Review Code 1- Verbalizes Understanding      Education: Nutrition 2 -Group instruction provided by verbal, written material, interactive activities, discussions, models, and posters to present general guidelines for heart healthy nutrition including sodium, cholesterol, and saturated fat. Providing guidance of habit forming to improve blood pressure, cholesterol, and body weight. Written material provided at class time.     Biometrics:  Pre Biometrics - 08/10/24 1517       Pre Biometrics   Height 5' 7 (1.702 m)    Weight 169 lb 11.2 oz (77 kg)    Waist Circumference 33 inches  Hip Circumference 36 inches    Waist to Hip Ratio 0.92 %    BMI (Calculated) 26.57    Grip Strength 33.2 kg    Single Leg Stand 0 seconds           Nutrition Therapy Plan and Nutrition Goals:  Nutrition Therapy & Goals - 08/10/24 1518       Intervention Plan   Intervention Nutrition handout(s) given to patient.;Prescribe, educate and counsel regarding individualized specific dietary modifications aiming towards targeted core components such as weight, hypertension, lipid management, diabetes, heart failure and other comorbidities.    Expected Outcomes Short Term Goal: Understand basic principles of dietary content, such as calories, fat, sodium, cholesterol and nutrients.;Long Term Goal: Adherence to prescribed nutrition plan.          Nutrition Assessments:  MEDIFICTS Score Key: >=70 Need to make dietary changes  40-70 Heart Healthy Diet <= 40 Therapeutic Level Cholesterol Diet  Flowsheet Row PULMONARY REHAB COPD ORIENTATION from 08/10/2024 in [facility]  Picture Your Plate Total Score on Admission 63   Picture Your Plate Scores: <59 Unhealthy dietary pattern with much room for improvement. 41-50  Dietary pattern unlikely to meet recommendations for good health and room for improvement. 51-60 More healthful dietary pattern, with some room for improvement.  >60 Healthy dietary pattern, although there may be some specific behaviors that could be improved.   Nutrition Goals Re-Evaluation:  Nutrition Goals Re-Evaluation     Row Name 09/10/24 1529 09/29/24 1415           Goals   Nutrition Goal healthy eating healthy eating      Comment Issiah is doing well and has been trying to watch what he eats. He has cu back on a lot of sweets due to his A1C. He has gotten that down to 5.6 lst time it was checked. He is eating mostly chicken and fish sometimes and trying to get in the veggies. he does drink water and will do Pepsi zero some. Anais is continuing to watch what he eats, less sweets and no salt, and less fried foods. He notes he is drinking more water also.      Expected Outcome Shirt: cut back on pepsi zero   long: continue to pick healthy foods Short: Continue to eat less sweets and fried and/or salty foods. Long: incorporate more high protein foods in diet.         Nutrition Goals Discharge (Final Nutrition Goals Re-Evaluation):  Nutrition Goals Re-Evaluation - 09/29/24 1415       Goals   Nutrition Goal healthy eating    Comment Dreon is continuing to watch what he eats, less sweets and no salt, and less fried foods. He notes he is drinking more water also.    Expected Outcome Short: Continue to eat less sweets and fried and/or salty foods. Long: incorporate more high protein foods in diet.          Psychosocial: Target Goals: Acknowledge presence or absence of significant depression and/or stress, maximize coping skills, provide positive support system. Participant is able to verbalize types and ability to use techniques and skills needed for reducing stress and depression.   Education: Stress, Anxiety, and Depression - Group verbal and visual presentation to define  topics covered.  Reviews how body is impacted by stress, anxiety, and depression.  Also discusses healthy ways to reduce stress and to treat/manage anxiety and depression.  Written material provided at class time. Flowsheet Row PULMONARY REHAB  CHRONIC OBSTRUCTIVE PULMONARY DISEASE from 10/08/2024 in [place] [facility]  Date 08/13/24  Educator [facility]  Instruction Review Code 1- Verbalizes Understanding    Education: Sleep Hygiene -Provides group verbal and written instruction about how sleep can affect your health.  Define sleep hygiene, discuss sleep cycles and impact of sleep habits. Review good sleep hygiene tips.    Initial Review & Psychosocial Screening:  Initial Psych Review & Screening - 08/05/24 1117       Initial Review   Current issues with Current Sleep Concerns      Family Dynamics   Good Support System? Yes    Comments His brothers are his main support systems, and he also gets together with his half sister.      Barriers   Psychosocial barriers to participate in program There are no identifiable barriers or psychosocial needs.      Screening Interventions   Interventions Encouraged to exercise;Program counselor consult;Provide feedback about the scores to participant    Expected Outcomes Short Term goal: Utilizing psychosocial counselor, staff and physician to assist with identification of specific Stressors or current issues interfering with healing process. Setting desired goal for each stressor or current issue identified.;Long Term Goal: Stressors or current issues are controlled or eliminated.;Short Term goal: Identification and review with participant of any Quality of Life or Depression concerns found by scoring the questionnaire.;Long Term goal: The participant improves quality of Life and PHQ9 Scores as seen by post scores and/or verbalization of changes          Quality of Life Scores:  Scores of 19 and below usually indicate a poorer quality of  life in these areas.  A difference of  2-3 points is a clinically meaningful difference.  A difference of 2-3 points in the total score of the Quality of Life Index has been associated with significant improvement in overall quality of life, self-image, physical symptoms, and general health in studies assessing change in quality of life.  PHQ-9: Review Flowsheet       08/10/2024 02/27/2021 10/17/2020  Depression screen PHQ 2/9  Decreased Interest 0 2 1  Down, Depressed, Hopeless 0 0 0  PHQ - 2 Score 0 2 1  Altered sleeping 1 3 2   Tired, decreased energy 1 2 1   Change in appetite 0 2 0  Feeling bad or failure about yourself  0 0 0  Trouble concentrating 0 0 0  Moving slowly or fidgety/restless 0 1 0  Suicidal thoughts 0 0 0  PHQ-9 Score 2 10  4    Difficult doing work/chores Not difficult at all Somewhat difficult Not difficult at all    Details       Data saved with a previous flowsheet row definition        Interpretation of Total Score  Total Score Depression Severity:  1-4 = Minimal depression, 5-9 = Mild depression, 10-14 = Moderate depression, 15-19 = Moderately severe depression, 20-27 = Severe depression   Psychosocial Evaluation and Intervention:  Psychosocial Evaluation - 08/05/24 1127       Psychosocial Evaluation & Interventions   Interventions Encouraged to exercise with the program and follow exercise prescription    Comments Pt was referred to PR by Dr. Abram at the [facility] for COPD.  The pt has been through our PR program before, and his goals for the program this time are to breathe better and be more mobile.  The pt denied any issues with anxiety, depression, or stress.  He does  still wake up at night a few times, but he has a prescription for Hydroxyzine as needed for sleep.  He lives alone and has had several falls over the last year, and when he fell in July of last year he fell off the porch and injured his right eye.  He has arthritis in his neck and back, and he  has started using a cane for stability.  His main support system are his brothers and his half sister.  He looks forward to starting the program, and we will monitor his progress.    Expected Outcomes Pt will continue to have no identifiable psychosocial barriers.    Continue Psychosocial Services  Follow up required by staff          Psychosocial Re-Evaluation:  Psychosocial Re-Evaluation     Row Name 09/10/24 1526 09/29/24 1411           Psychosocial Re-Evaluation   Current issues with None Identified Current Sleep Concerns      Comments Nabil has been doing well in rehab. HE has no major stressor in his life stated that he cant control outcomes so he does not let them stress him out. He also does not have any isseus with sleeping. He gets up natually around 5am - 6am everymorning ike he did when he was working. Muneer is doing great in rehab! He doesn't complain of any stressors. But he notes he doesn't sleep the best he says he tosses and turns a lot because his mattress is usually all messed up when he wakes up. He also notes he is ordered a CPAP but says it is uncomfortable so he doesn't wear it.      Expected Outcomes ShortL: continue to have no issues   ong: continue to exericse for overall wellness Short:speak with MD about sleep and other options for CPAP. Long: continue to have healthy stress outlets.      Interventions Encouraged to attend Pulmonary Rehabilitation for the exercise Encouraged to attend Pulmonary Rehabilitation for the exercise      Continue Psychosocial Services  Follow up required by staff Follow up required by staff         Psychosocial Discharge (Final Psychosocial Re-Evaluation):  Psychosocial Re-Evaluation - 09/29/24 1411       Psychosocial Re-Evaluation   Current issues with Current Sleep Concerns    Comments Clayton is doing great in rehab! He doesn't complain of any stressors. But he notes he doesn't sleep the best he says he tosses and turns a  lot because his mattress is usually all messed up when he wakes up. He also notes he is ordered a CPAP but says it is uncomfortable so he doesn't wear it.    Expected Outcomes Short:speak with MD about sleep and other options for CPAP. Long: continue to have healthy stress outlets.    Interventions Encouraged to attend Pulmonary Rehabilitation for the exercise    Continue Psychosocial Services  Follow up required by staff          Education: Education Goals: Education classes will be provided on a weekly basis, covering required topics. Participant will state understanding/return demonstration of topics presented.  Learning Barriers/Preferences:  Learning Barriers/Preferences - 08/05/24 1110       Learning Barriers/Preferences   Learning Barriers Sight;Exercise Concerns    Learning Preferences Written Material          General Pulmonary Education Topics:  Infection Prevention: - Provides verbal and written material to individual with discussion of  infection control including proper hand washing and proper equipment cleaning during exercise session.   Falls Prevention: - Provides verbal and written material to individual with discussion of falls prevention and safety.   Chronic Lung Disease Review: - Group verbal instruction with posters, models, PowerPoint presentations and videos,  to review new updates, new respiratory medications, new advancements in procedures and treatments. Providing information on websites and 800 numbers for continued self-education. Includes information about supplement oxygen, available portable oxygen systems, continuous and intermittent flow rates, oxygen safety, concentrators, and Medicare reimbursement for oxygen. Explanation of Pulmonary Drugs, including class, frequency, complications, importance of spacers, rinsing mouth after steroid MDI's, and proper cleaning methods for nebulizers. Review of basic lung anatomy and physiology related to  function, structure, and complications of lung disease. Review of risk factors. Discussion about methods for diagnosing sleep apnea and types of masks and machines for OSA. Includes a review of the use of types of environmental controls: home humidity, furnaces, filters, dust mite/pet prevention, HEPA vacuums. Discussion about weather changes, air quality and the benefits of nasal washing. Instruction on Warning signs, infection symptoms, calling MD promptly, preventive modes, and value of vaccinations. Review of effective airway clearance, coughing and/or vibration techniques. Emphasizing that all should Create an Action Plan. Written material provided at class time. Flowsheet Row PULMONARY REHAB CHRONIC OBSTRUCTIVE PULMONARY DISEASE from 10/08/2024 in [place] [facility]  Date 09/03/24  Educator [facility]  Instruction Review Code 1- Verbalizes Understanding    AED/CPR: - Group verbal and written instruction with the use of models to demonstrate the basic use of the AED with the basic ABC's of resuscitation.    Tests and Procedures:  - Group verbal and visual presentation and models provide information about basic cardiac anatomy and function. Reviews the testing methods done to diagnose heart disease and the outcomes of the test results. Describes the treatment choices: Medical Management, Angioplasty, or Coronary Bypass Surgery for treating various heart conditions including Myocardial Infarction, Angina, Valve Disease, and Cardiac Arrhythmias.  Written material provided at class time. Flowsheet Row PULMONARY REHAB CHRONIC OBSTRUCTIVE PULMONARY DISEASE from 10/08/2024 in [place] [facility]  Date 09/10/24  Educator [facility]  Instruction Review Code 1- Verbalizes Understanding    Medication Safety: - Group verbal and visual instruction to review commonly prescribed medications for heart and lung disease. Reviews the medication, class of the drug, and side effects. Includes the  steps to properly store meds and maintain the prescription regimen.  Written material given at graduation.   Other: -Provides group and verbal instruction on various topics (see comments) Flowsheet Row PULMONARY REHAB CHRONIC OBSTRUCTIVE PULMONARY DISEASE from 10/08/2024 in [place] [facility]  Date 10/01/24  Educator jh  Instruction Review Code 2- Demonstrated Understanding    Knowledge Questionnaire Score:  Knowledge Questionnaire Score - 08/10/24 1402       Knowledge Questionnaire Score   Pre Score 14/18           Core Components/Risk Factors/Patient Goals at Admission:  Personal Goals and Risk Factors at Admission - 08/10/24 1518       Core Components/Risk Factors/Patient Goals on Admission    Weight Management Yes;Weight Loss;Weight Maintenance    Intervention Weight Management: Provide education and appropriate resources to help participant work on and attain dietary goals.;Weight Management: Develop a combined nutrition and exercise program designed to reach desired caloric intake, while maintaining appropriate intake of nutrient and fiber, sodium and fats, and appropriate energy expenditure required for the weight goal.  Admit Weight 169 lb 11.2 oz (77 kg)    Goal Weight: Short Term 167 lb (75.8 kg)    Goal Weight: Long Term 167 lb (75.8 kg)    Expected Outcomes Weight Maintenance: Understanding of the daily nutrition guidelines, which includes 25-35% calories from fat, 7% or less cal from saturated fats, less than 200mg  cholesterol, less than 1.5gm of sodium, & 5 or more servings of fruits and vegetables daily;Short Term: Continue to assess and modify interventions until short term weight is achieved;Long Term: Adherence to nutrition and physical activity/exercise program aimed toward attainment of established weight goal;Weight Loss: Understanding of general recommendations for a balanced deficit meal plan, which promotes 1-2 lb weight loss per week and  includes a negative energy balance of [phone] kcal/d;Understanding recommendations for meals to include 15-35% energy as protein, 25-35% energy from fat, 35-60% energy from carbohydrates, less than 200mg  of dietary cholesterol, 20-35 gm of total fiber daily;Understanding of distribution of calorie intake throughout the day with the consumption of 4-5 meals/snacks    Tobacco Cessation Yes    Number of packs per day 1/2-3/4 packs    Intervention Assist the participant in steps to quit. Provide individualized education and counseling about committing to Tobacco Cessation, relapse prevention, and pharmacological support that can be provided by physician.;Education officer, environmental, assist with locating and accessing local/national Quit Smoking programs, and support quit date choice.    Expected Outcomes Short Term: Will demonstrate readiness to quit, by selecting a quit date.;Short Term: Will quit all tobacco product use, adhering to prevention of relapse plan.;Long Term: Complete abstinence from all tobacco products for at least 12 months from quit date.    Improve shortness of breath with ADL's Yes    Intervention Provide education, individualized exercise plan and daily activity instruction to help decrease symptoms of SOB with activities of daily living.    Expected Outcomes Short Term: Improve cardiorespiratory fitness to achieve a reduction of symptoms when performing ADLs;Long Term: Be able to perform more ADLs without symptoms or delay the onset of symptoms    Increase knowledge of respiratory medications and ability to use respiratory devices properly  Yes    Intervention Provide education and demonstration as needed of appropriate use of medications, inhalers, and oxygen therapy.    Expected Outcomes Short Term: Achieves understanding of medications use. Understands that oxygen is a medication prescribed by physician. Demonstrates appropriate use of inhaler and oxygen therapy.;Long Term: Maintain  appropriate use of medications, inhalers, and oxygen therapy.    Diabetes Yes    Intervention Provide education about signs/symptoms and action to take for hypo/hyperglycemia.;Provide education about proper nutrition, including hydration, and aerobic/resistive exercise prescription along with prescribed medications to achieve blood glucose in normal ranges: Fasting glucose 65-99 mg/dL    Expected Outcomes Short Term: Participant verbalizes understanding of the signs/symptoms and immediate care of hyper/hypoglycemia, proper foot care and importance of medication, aerobic/resistive exercise and nutrition plan for blood glucose control.;Long Term: Attainment of HbA1C < 7%.    Hypertension Yes    Intervention Provide education on lifestyle modifcations including regular physical activity/exercise, weight management, moderate sodium restriction and increased consumption of fresh fruit, vegetables, and low fat dairy, alcohol moderation, and smoking cessation.;Monitor prescription use compliance.    Expected Outcomes Short Term: Continued assessment and intervention until BP is < 140/89mm HG in hypertensive participants. < 130/35mm HG in hypertensive participants with diabetes, heart failure or chronic kidney disease.;Long Term: Maintenance of blood pressure at goal levels.    Lipids  Yes    Intervention Provide education and support for participant on nutrition & aerobic/resistive exercise along with prescribed medications to achieve LDL 70mg , HDL >40mg .    Expected Outcomes Short Term: Participant states understanding of desired cholesterol values and is compliant with medications prescribed. Participant is following exercise prescription and nutrition guidelines.;Long Term: Cholesterol controlled with medications as prescribed, with individualized exercise RX and with personalized nutrition plan. Value goals: LDL < 70mg , HDL > 40 mg.          Education:Diabetes - Individual verbal and written instruction  to review signs/symptoms of diabetes, desired ranges of glucose level fasting, after meals and with exercise. Acknowledge that pre and post exercise glucose checks will be done for 3 sessions at entry of program.   Know Your Numbers and Heart Failure: - Group verbal and visual instruction to discuss disease risk factors for cardiac and pulmonary disease and treatment options.  Reviews associated critical values for Overweight/Obesity, Hypertension, Cholesterol, and Diabetes.  Discusses basics of heart failure: signs/symptoms and treatments.  Introduces Heart Failure Zone chart for action plan for heart failure. Written material provided at class time.   Core Components/Risk Factors/Patient Goals Review:   Goals and Risk Factor Review     Row Name 09/10/24 1531 09/29/24 1418 09/29/24 1421         Core Components/Risk Factors/Patient Goals Review   Personal Goals Review Tobacco Cessation;Improve shortness of breath with ADL's Tobacco Cessation;Improve shortness of breath with ADL's;Develop more efficient breathing techniques such as purse lipped breathing and diaphragmatic breathing and practicing self-pacing with activity.;Diabetes;Hypertension;Lipids --     Review Zahid is doing well overal with rehab. He does not let stress bother him. He does continue to smoke and we encourage smokning cessation. Boubacar has diabetes and high blood pressure, he notes he does not check his blood sugar or blood pressure as often as he should, maybe a couple of times a month. He notes his A1C has improved greatly. --     Expected Outcomes Short: smoking cessation   long: continewu to attend rehab -- Short: start checking blood glucose and blood pressure more at home. Long: continue following up with MDs and taking medications as prescribed.        Core Components/Risk Factors/Patient Goals at Discharge (Final Review):   Goals and Risk Factor Review - 09/29/24 1421       Core Components/Risk Factors/Patient  Goals Review   Expected Outcomes Short: start checking blood glucose and blood pressure more at home. Long: continue following up with MDs and taking medications as prescribed.          ITP Comments:  ITP Comments     Row Name 08/10/24 1419 08/13/24 1349 08/25/24 1113 09/22/24 1633 10/20/24 1014   ITP Comments Patient arrived for 1st visit/orientation/education at 1300. Patient was referred to PR by Dr. Caron Reil from the [facility] due to COPD. During orientation advised patient on arrival and appointment times what to wear, what to do before, during and after exercise. Reviewed attendance and class policy.  Pt is scheduled to return Pulmonary Rehab on 08/13/24 at 1330. Pt was advised to come to class 15 minutes before class starts.  Discussed RPE/Dpysnea scales. Patient participated in warm up stretches. Patient was able to complete 6 minute walk test.  Patient was measured for the equipment. Discussed equipment safety with patient. Took patient pre-anthropometric measurements. Patient finished visit at 1410. First full day of exercise!  Patient was oriented to gym and equipment including  functions, settings, policies, and procedures.  Patient's individual exercise prescription and treatment plan were reviewed.  All starting workloads were established based on the results of the 6 minute walk test done at initial orientation visit.  The plan for exercise progression was also introduced and progression will be customized based on patient's performance and goals. 30 day review completed. ITP sent to Dr.Jehanzeb Memon, Medical Director of  Pulmonary Rehab. Continue with ITP unless changes are made by physician.  Sill newer to program 30 day review completed. ITP sent to Dr.Jehanzeb Memon, Medical Director of  Pulmonary Rehab. Continue with ITP unless changes are made by physician. 30 day review completed. ITP sent to Dr.Jehanzeb Memon, Medical Director of  Pulmonary Rehab. Continue with ITP unless changes are  made by physician.      Comments: 30 day review      [1]  Current Outpatient Medications:    Acidophilus Lactobacillus CAPS, Take 2 tablets by mouth daily at 12 noon. (Patient not taking: Reported on 08/05/2024), Disp: , Rfl:    acyclovir (ZOVIRAX) 400 MG tablet, Take 400 mg by mouth 2 (two) times daily as needed (for flareup)., Disp: , Rfl:    albuterol  (PROVENTIL ) (2.5 MG/3ML) 0.083% nebulizer solution, Take 2.5 mg by nebulization every 6 (six) hours as needed., Disp: , Rfl:    albuterol -ipratropium (COMBIVENT ) 18-103 MCG/ACT inhaler, Inhale 1 puff into the lungs every 6 (six) hours as needed., Disp: , Rfl:    allopurinol (ZYLOPRIM) 100 MG tablet, Take 100 mg by mouth daily., Disp: , Rfl:    amLODipine  (NORVASC ) 5 MG tablet, Take 10 mg by mouth daily., Disp: , Rfl:    aspirin 81 MG chewable tablet, Chew 81 mg by mouth daily., Disp: , Rfl:    Azelastine HCl 137 MCG/SPRAY SOLN, Place 1 spray into the nose at bedtime. (Patient not taking: Reported on 08/05/2024), Disp: , Rfl:    budesonide -formoterol  (SYMBICORT) 160-4.5 MCG/ACT inhaler, Inhale 2 puffs into the lungs 2 (two) times daily. (Patient not taking: Reported on 08/05/2024), Disp: , Rfl:    carbidopa-levodopa (SINEMET IR) 25-100 MG tablet, Take 0.5 tablets by mouth 2 (two) times daily., Disp: , Rfl:    colchicine 0.6 MG tablet, Take 0.6 mg by mouth as needed., Disp: , Rfl:    cyclobenzaprine (FLEXERIL) 10 MG tablet, Take 15 mg by mouth at bedtime., Disp: , Rfl:    fluticasone (FLONASE) 50 MCG/ACT nasal spray, Place 1 spray into both nostrils 3 (three) times daily., Disp: , Rfl:    gabapentin  (NEURONTIN ) 600 MG tablet, Take 1,200 mg by mouth 3 (three) times daily., Disp: , Rfl:    hydroxychloroquine (PLAQUENIL) 200 MG tablet, Take 400 mg by mouth daily. Monday through Friday, then 1 tablet on Saturday and Sunday, Disp: , Rfl:    hydrOXYzine (VISTARIL) 25 MG capsule, Take 25 mg by mouth every 6 (six) hours as needed for itching  (sleep)., Disp: , Rfl:    Hypertonic Nasal Wash (SINUS RINSE BOTTLE KIT NA), Place 1 packet into the nose as needed., Disp: , Rfl:    ipratropium (ATROVENT) 0.03 % nasal spray, Place 2 sprays into both nostrils 3 (three) times daily. (Patient not taking: Reported on 08/05/2024), Disp: , Rfl:    lidocaine  (LIDODERM ) 5 %, Place 1 patch onto the skin daily. Remove & Discard patch within 12 hours or as directed by MD, Disp: , Rfl:    loratadine (CLARITIN) 10 MG tablet, Take 10 mg by mouth daily., Disp: , Rfl:  losartan (COZAAR) 50 MG tablet, Take 25 mg by mouth daily., Disp: , Rfl:    metFORMIN (GLUCOPHAGE) 500 MG tablet, Take 500 mg by mouth 2 (two) times daily with a meal., Disp: , Rfl:    Mometasone  Furoate 200 MCG/ACT AERO, Take 1 puff by mouth 2 (two) times daily., Disp: , Rfl:    nicotine  (NICODERM CQ  - DOSED IN MG/24 HOURS) 21 mg/24hr patch, Place 21 mg onto the skin daily. (Patient not taking: Reported on 08/05/2024), Disp: , Rfl:    nicotine  (NICODERM CQ  - DOSED IN MG/24 HR) 7 mg/24hr patch, Place 7 mg onto the skin daily. (Patient not taking: Reported on 08/05/2024), Disp: , Rfl:    nicotine  polacrilex (COMMIT) 2 MG lozenge, Take 2 mg by mouth as needed for smoking cessation. (Patient not taking: Reported on 08/05/2024), Disp: , Rfl:    omeprazole (PRILOSEC) 40 MG capsule, Take 40 mg by mouth daily., Disp: , Rfl:    potassium chloride  (KLOR-CON ) 10 MEQ tablet, Take 10 mEq by mouth daily. (Patient not taking: Reported on 08/05/2024), Disp: , Rfl:    predniSONE  (DELTASONE ) 20 MG tablet, Take 3 tablets by mouth daily x1 day; then 2 tablets by mouth daily x2 days; then 1 tablet by mouth daily x3 days; then half tablet by mouth daily x3 days and stop prednisone . (Patient not taking: Reported on 08/05/2024), Disp: 30 tablet, Rfl: 0   Propylene Glycol (SYSTANE BALANCE OP), Apply 1 drop to eye 4 (four) times daily. Both eyes, Disp: , Rfl:    Riboflavin 100 MG TABS, Take 400 mg by mouth daily. (Patient  not taking: Reported on 08/05/2024), Disp: , Rfl:    rosuvastatin (CRESTOR) 40 MG tablet, Take 20 mg by mouth daily., Disp: , Rfl:    sildenafil (VIAGRA) 100 MG tablet, Take 50 mg by mouth daily as needed for erectile dysfunction., Disp: , Rfl:    SUMAtriptan (IMITREX) 100 MG tablet, Take 100 mg by mouth as needed., Disp: , Rfl:    Tiotropium Bromide Monohydrate (SPIRIVA RESPIMAT) 2.5 MCG/ACT AERS, Inhale 2 puffs into the lungs daily. (Patient not taking: Reported on 08/05/2024), Disp: , Rfl:    Tiotropium Bromide-Olodaterol 2.5-2.5 MCG/ACT AERS, Take 2 puffs by mouth daily., Disp: , Rfl:    traMADol (ULTRAM) 50 MG tablet, Take 100 mg by mouth 3 (three) times daily as needed for moderate pain., Disp: , Rfl:  [2]  Social History Tobacco Use  Smoking Status Every Day   Current packs/day: 0.25   Average packs/day: 0.3 packs/day for 46.0 years (11.5 ttl pk-yrs)   Types: Cigarettes  Smokeless Tobacco Former   Types: Snuff  Tobacco Comments   using a nicotine  patch, trying to quit

## 2024-10-22 ENCOUNTER — Ambulatory Visit (HOSPITAL_COMMUNITY)

## 2024-10-27 ENCOUNTER — Ambulatory Visit (HOSPITAL_COMMUNITY)

## 2024-10-29 ENCOUNTER — Encounter (HOSPITAL_COMMUNITY)

## 2024-11-03 ENCOUNTER — Ambulatory Visit (HOSPITAL_COMMUNITY)

## 2024-11-05 ENCOUNTER — Ambulatory Visit (HOSPITAL_COMMUNITY)

## 2024-11-10 ENCOUNTER — Encounter (HOSPITAL_COMMUNITY)

## 2024-11-12 ENCOUNTER — Encounter (HOSPITAL_COMMUNITY)

## 2024-11-17 ENCOUNTER — Ambulatory Visit (HOSPITAL_COMMUNITY)

## 2024-11-19 ENCOUNTER — Encounter (HOSPITAL_COMMUNITY)

## 2024-11-24 ENCOUNTER — Encounter (HOSPITAL_COMMUNITY)

## 2024-11-26 ENCOUNTER — Ambulatory Visit (HOSPITAL_COMMUNITY)

## 2024-12-01 ENCOUNTER — Ambulatory Visit (HOSPITAL_COMMUNITY)

## 2024-12-03 ENCOUNTER — Encounter (HOSPITAL_COMMUNITY)

## 2024-12-08 ENCOUNTER — Ambulatory Visit (HOSPITAL_COMMUNITY)

## 2024-12-10 ENCOUNTER — Ambulatory Visit (HOSPITAL_COMMUNITY)
